# Patient Record
Sex: Female | Born: 1955 | Race: White | Hispanic: No | Marital: Married | Smoking: Never smoker
Health system: Southern US, Community
[De-identification: ages and names within clinical notes are randomized; demographics above are authoritative.]

## PROBLEM LIST (undated history)

## (undated) DIAGNOSIS — D259 Leiomyoma of uterus, unspecified: Secondary | ICD-10-CM

## (undated) DIAGNOSIS — K5792 Diverticulitis of intestine, part unspecified, without perforation or abscess without bleeding: Secondary | ICD-10-CM

## (undated) DIAGNOSIS — J45909 Unspecified asthma, uncomplicated: Secondary | ICD-10-CM

## (undated) DIAGNOSIS — U071 COVID-19: Secondary | ICD-10-CM

## (undated) DIAGNOSIS — N289 Disorder of kidney and ureter, unspecified: Secondary | ICD-10-CM

## (undated) DIAGNOSIS — K219 Gastro-esophageal reflux disease without esophagitis: Secondary | ICD-10-CM

## (undated) DIAGNOSIS — F419 Anxiety disorder, unspecified: Secondary | ICD-10-CM

## (undated) DIAGNOSIS — T7840XA Allergy, unspecified, initial encounter: Secondary | ICD-10-CM

## (undated) DIAGNOSIS — C44311 Basal cell carcinoma of skin of nose: Secondary | ICD-10-CM

## (undated) DIAGNOSIS — I499 Cardiac arrhythmia, unspecified: Secondary | ICD-10-CM

## (undated) DIAGNOSIS — E785 Hyperlipidemia, unspecified: Secondary | ICD-10-CM

## (undated) DIAGNOSIS — N83209 Unspecified ovarian cyst, unspecified side: Secondary | ICD-10-CM

## (undated) DIAGNOSIS — I493 Ventricular premature depolarization: Secondary | ICD-10-CM

## (undated) HISTORY — PX: ESOPHAGOGASTRODUODENOSCOPY: SHX1529

## (undated) HISTORY — DX: Gastro-esophageal reflux disease without esophagitis: K21.9

## (undated) HISTORY — PX: COLONOSCOPY: SHX174

## (undated) HISTORY — DX: Unspecified ovarian cyst, unspecified side: N83.209

## (undated) HISTORY — DX: Cardiac arrhythmia, unspecified: I49.9

## (undated) HISTORY — DX: Hyperlipidemia, unspecified: E78.5

## (undated) HISTORY — DX: COVID-19: U07.1

## (undated) HISTORY — DX: Anxiety disorder, unspecified: F41.9

## (undated) HISTORY — PX: DILATION AND CURETTAGE OF UTERUS: SHX78

## (undated) HISTORY — DX: Disorder of kidney and ureter, unspecified: N28.9

## (undated) HISTORY — DX: Unspecified asthma, uncomplicated: J45.909

## (undated) HISTORY — DX: Allergy, unspecified, initial encounter: T78.40XA

## (undated) HISTORY — DX: Leiomyoma of uterus, unspecified: D25.9

## (undated) HISTORY — DX: Diverticulitis of intestine, part unspecified, without perforation or abscess without bleeding: K57.92

---

## 1998-10-30 ENCOUNTER — Other Ambulatory Visit: Admission: RE | Admit: 1998-10-30 | Discharge: 1998-10-30 | Payer: Self-pay | Admitting: Obstetrics & Gynecology

## 2000-08-28 ENCOUNTER — Other Ambulatory Visit: Admission: RE | Admit: 2000-08-28 | Discharge: 2000-08-28 | Payer: Self-pay | Admitting: Obstetrics & Gynecology

## 2002-01-14 ENCOUNTER — Other Ambulatory Visit: Admission: RE | Admit: 2002-01-14 | Discharge: 2002-01-14 | Payer: Self-pay | Admitting: Obstetrics & Gynecology

## 2002-01-17 ENCOUNTER — Ambulatory Visit (HOSPITAL_COMMUNITY): Admission: RE | Admit: 2002-01-17 | Discharge: 2002-01-17 | Payer: Self-pay | Admitting: Obstetrics & Gynecology

## 2002-01-17 ENCOUNTER — Encounter: Payer: Self-pay | Admitting: Obstetrics & Gynecology

## 2003-04-26 ENCOUNTER — Other Ambulatory Visit: Admission: RE | Admit: 2003-04-26 | Discharge: 2003-04-26 | Payer: Self-pay | Admitting: Obstetrics & Gynecology

## 2004-06-21 ENCOUNTER — Other Ambulatory Visit: Admission: RE | Admit: 2004-06-21 | Discharge: 2004-06-21 | Payer: Self-pay | Admitting: Obstetrics & Gynecology

## 2004-09-22 DIAGNOSIS — D259 Leiomyoma of uterus, unspecified: Secondary | ICD-10-CM

## 2004-09-22 HISTORY — DX: Leiomyoma of uterus, unspecified: D25.9

## 2006-05-11 ENCOUNTER — Other Ambulatory Visit: Admission: RE | Admit: 2006-05-11 | Discharge: 2006-05-11 | Payer: Self-pay | Admitting: Obstetrics and Gynecology

## 2006-07-02 ENCOUNTER — Emergency Department (HOSPITAL_COMMUNITY): Admission: EM | Admit: 2006-07-02 | Discharge: 2006-07-02 | Payer: Self-pay | Admitting: Family Medicine

## 2007-08-06 ENCOUNTER — Other Ambulatory Visit: Admission: RE | Admit: 2007-08-06 | Discharge: 2007-08-06 | Payer: Self-pay | Admitting: Obstetrics and Gynecology

## 2008-04-03 ENCOUNTER — Emergency Department (HOSPITAL_COMMUNITY): Admission: EM | Admit: 2008-04-03 | Discharge: 2008-04-03 | Payer: Self-pay | Admitting: Family Medicine

## 2008-12-27 ENCOUNTER — Other Ambulatory Visit: Admission: RE | Admit: 2008-12-27 | Discharge: 2008-12-27 | Payer: Self-pay | Admitting: Obstetrics and Gynecology

## 2009-09-07 ENCOUNTER — Emergency Department (HOSPITAL_COMMUNITY): Admission: EM | Admit: 2009-09-07 | Discharge: 2009-09-07 | Payer: Self-pay | Admitting: Emergency Medicine

## 2010-04-04 ENCOUNTER — Encounter: Payer: Self-pay | Admitting: Internal Medicine

## 2010-05-21 ENCOUNTER — Encounter: Payer: Self-pay | Admitting: Internal Medicine

## 2010-07-02 ENCOUNTER — Encounter: Payer: Self-pay | Admitting: Internal Medicine

## 2010-09-06 ENCOUNTER — Encounter: Payer: Self-pay | Admitting: Internal Medicine

## 2010-09-11 ENCOUNTER — Encounter: Payer: Self-pay | Admitting: Internal Medicine

## 2010-10-03 ENCOUNTER — Encounter: Payer: Self-pay | Admitting: Internal Medicine

## 2010-10-04 ENCOUNTER — Ambulatory Visit
Admission: RE | Admit: 2010-10-04 | Discharge: 2010-10-04 | Payer: Self-pay | Source: Home / Self Care | Attending: Internal Medicine | Admitting: Internal Medicine

## 2010-10-04 DIAGNOSIS — R002 Palpitations: Secondary | ICD-10-CM | POA: Insufficient documentation

## 2010-10-04 DIAGNOSIS — R0989 Other specified symptoms and signs involving the circulatory and respiratory systems: Secondary | ICD-10-CM

## 2010-10-04 DIAGNOSIS — R0609 Other forms of dyspnea: Secondary | ICD-10-CM | POA: Insufficient documentation

## 2010-10-04 DIAGNOSIS — E785 Hyperlipidemia, unspecified: Secondary | ICD-10-CM | POA: Insufficient documentation

## 2010-10-04 DIAGNOSIS — R0602 Shortness of breath: Secondary | ICD-10-CM | POA: Insufficient documentation

## 2010-10-09 ENCOUNTER — Ambulatory Visit (HOSPITAL_COMMUNITY): Admission: RE | Admit: 2010-10-09 | Payer: Self-pay | Source: Home / Self Care | Admitting: Internal Medicine

## 2010-10-09 ENCOUNTER — Ambulatory Visit: Admit: 2010-10-09 | Payer: Self-pay

## 2010-10-17 ENCOUNTER — Ambulatory Visit: Admission: RE | Admit: 2010-10-17 | Discharge: 2010-10-17 | Payer: Self-pay | Source: Home / Self Care

## 2010-10-17 ENCOUNTER — Ambulatory Visit (HOSPITAL_COMMUNITY)
Admission: RE | Admit: 2010-10-17 | Discharge: 2010-10-17 | Payer: Self-pay | Source: Home / Self Care | Attending: Internal Medicine | Admitting: Internal Medicine

## 2010-10-24 NOTE — Letter (Signed)
Summary: [facility]   Imported By: Marylou Mccoy 10/03/2010 14:22:45  _____________________________________________________________________  External Attachment:    Type:   Image     Comment:   External Document

## 2010-10-24 NOTE — Assessment & Plan Note (Addendum)
Summary: palpitations/dyspena/mt   Visit Type:  Initial Consult Primary Provider:  Silas Sacramento  CC:  palpitations.  History of Present Illness: patient is a 55 year old who was refered for evaluation of palpitations. The patinet was followed by Stevphen Rochester in the past.  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  She has had them for years.  Aoubt 3 months ago they became more frequent.  Stay as isolated skips.  Acomp by some dizziness, occasional tightness. She also notes SOB with taking.  PFTs in the past have been OK.  Denies SOB with activity.  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 She does note increased stress with her job.  Current Medications (verified): 1)  Melatonin 3 Mg Tabs (Melatonin) .... Take 1 Tab By Mouth At Bedtime 2)  Alprazolam 0.25 Mg Tabs (Alprazolam) .... As Needed 3)  Vivelle-Dot 0.0375 Mg/24hr Pttw (Estradiol) .... Tweice A Week 4)  Prometrium 100 Mg Caps (Progesterone Micronized) .... Take 1 Capsule By Mouth Once A Day 5)  Symbicort 80-4.5 Mcg/act Aero (Budesonide-Formoterol Fumarate) .... As Needed 6)  Albuterol Sulfate (2.5 Mg/57ml) 0.083% Nebu (Albuterol Sulfate) .... As Needed  Allergies (verified): No Known Drug Allergies  Past History:  Past Medical History:  asthma, allergies  PVCs  Family History: Father:  History of CAD CABG and valve replacement at 16.  Died 37 Mother A and W ! sis with HTN, dyslipidemia.   1 bro A&W  Social History:  non-smoker,non-drinker, nodrug abuse  Nurse Married.  Review of Systems       All systems reviewed.  NEg to the above problem except as noted above. LDL was 149, HDL 60 on last lipid eval.  Vital Signs:  Patient profile:   55 year old female Height:      63 inches Weight:      128.75 pounds BMI:     22.89 Pulse rate:   84 / minute Pulse rhythm:   regular Resp:     18 per minute BP sitting:   126 / 80  (left arm) Cuff size:   large  Vitals Entered By: Vikki Ports  (October 04, 2010 3:53 PM)  Physical Exam  Additional Exam:  Patient is in NAD HEENT:  Normocephalic, atraumatic. EOMI, PERRLA.  Neck: JVP is normal. No thyromegaly. No bruits.  Lungs: clear to auscultation. No rales no wheezes.  Heart: Regular rate and rhythm. Normal S1, S2. No S3.   No significant murmurs. PMI not displaced.  Abdomen:  Supple, nontender. Normal bowel sounds. No masses. No hepatomegaly.  Extremities:   Good distal pulses throughout. No lower extremity edema.  Musculoskeletal :moving all extremities.  Neuro:   alert and oriented x3.    EKG  Procedure date:  09/11/2010  Findings:      Outside EKG:  NSR.  Impression & Recommendations:  Problem # 1:  PALPITATIONS (ICD-785.1) Palpitations sound like isolated PVCs.  Frequency has increased.  I would recomm a holtermonitor to evaluate burden.I would also recommend and Echo to eval LVEF.   TSH and K were normal.   Patient instructed to limit caffeine.  Problem # 2:  DYSPNEA (ICD-786.05) Dyspnea is not with exertion it is with talking .  She has none with exertion.  No wheezing on exam. Will review echo.  Problem # 3:  HYPERLIPIDEMIA-MIXED (ICD-272.4) COunselled on diet.  Should be followed.  Other Orders: Holter Monitor (Holter Monitor) Echocardiogram (Echo)  Patient Instructions: 1)  Your physician recommends that you schedule a follow-up appointment in: after tests are complete and reviewed by Dr. Tenny Craw 2)  Your physician recommends that you continue on your current medications as directed. Please refer to the Current Medication list given to you today. 3)  Your physician has requested that you have an echocardiogram.  Echocardiography is a painless test that uses sound waves to create images of your heart. It provides your doctor with information about the size and shape of your heart and how well your heart's chambers and valves are working.  This procedure takes approximately one hour. There are no  restrictions for this procedure. 4)  Your physician has recommended that you wear a 24 hour holter monitor.  Holter monitors are medical devices that record the heart's electrical activity. Doctors most often use these monitors to diagnose arrhythmias. Arrhythmias are problems with the speed or rhythm of the heartbeat. The monitor is a small, portable device. You can wear one while you do your normal daily activities. This is usually used to diagnose what is causing palpitations/syncope (passing out).

## 2010-11-08 ENCOUNTER — Telehealth: Payer: Self-pay | Admitting: Internal Medicine

## 2010-11-13 NOTE — Progress Notes (Signed)
Summary: CHECKING HOLTER MONITOR  Phone Note Call from Patient Call back at [facility] Phone [phone]   Caller: Patient Summary of Call: PT CALLLING TO CHECK ON HEART MONITOR Initial call taken by: Judie Grieve,  November 08, 2010 2:20 PM  Follow-up for Phone Call        LM for Windell Moulding to call me concerning monitor.  Layne Benton, RN, BSN  November 08, 2010 3:02 PM   Additional Follow-up for Phone Call Additional follow up Details #1::        Windell Moulding scheduled her for an event monitor on 2/24.  Layne Benton, RN, BSN  November 08, 2010 5:42 PM

## 2010-11-15 ENCOUNTER — Encounter (INDEPENDENT_AMBULATORY_CARE_PROVIDER_SITE_OTHER): Payer: 59

## 2010-11-15 DIAGNOSIS — R002 Palpitations: Secondary | ICD-10-CM

## 2010-11-28 ENCOUNTER — Telehealth: Payer: Self-pay | Admitting: Internal Medicine

## 2010-12-03 NOTE — Progress Notes (Signed)
Summary: test results  Phone Note Call from Patient Call back at Home Phone [phone]   Caller: Patient Summary of Call: Pt returning call regarding test results Initial call taken by: Judie Grieve,  November 28, 2010 2:20 PM  Follow-up for Phone Call        Called patient with results of echo and holter monitor.  Layne Benton, RN, BSN  November 28, 2010 5:15 PM

## 2012-06-09 ENCOUNTER — Ambulatory Visit (INDEPENDENT_AMBULATORY_CARE_PROVIDER_SITE_OTHER): Payer: 59 | Admitting: Family Medicine

## 2012-06-09 ENCOUNTER — Other Ambulatory Visit: Payer: Self-pay | Admitting: Physician Assistant

## 2012-06-09 VITALS — BP 102/72 | HR 84 | Temp 98.9°F | Resp 18 | Ht 63.0 in | Wt 123.0 lb

## 2012-06-09 DIAGNOSIS — J40 Bronchitis, not specified as acute or chronic: Secondary | ICD-10-CM

## 2012-06-09 DIAGNOSIS — J45909 Unspecified asthma, uncomplicated: Secondary | ICD-10-CM

## 2012-06-09 MED ORDER — CEFDINIR 300 MG PO CAPS: 300.0000 mg | ORAL_CAPSULE | Freq: Two times a day (BID) | ORAL | Status: DC

## 2012-06-09 MED ORDER — PREDNISONE 20 MG PO TABS: ORAL_TABLET | ORAL | Status: DC

## 2012-06-09 MED ORDER — FLUTICASONE PROPIONATE 50 MCG/ACT NA SUSP: 2.0000 | Freq: Every day | NASAL | Status: DC

## 2012-06-09 MED ORDER — BECLOMETHASONE DIPROPIONATE 80 MCG/ACT IN AERS: 1.0000 | INHALATION_SPRAY | RESPIRATORY_TRACT | Status: DC | PRN

## 2012-06-09 NOTE — Progress Notes (Signed)
[facility] [address] [place] [address] [phone]  Date:  06/09/2012   Name:  Amy Phillips   DOB:  Feb 18, 1956   MRN:  [id number]  PCP:  Shade Flood, MD    Chief Complaint: Cough and Asthma   History of Present Illness:  Amy Phillips is a 56 y.o. very pleasant female patient who presents with the following:  She is here with illness today.  She has been ill for almost a week.  She has noted sinus drainage, and then developed chest congestion.  She has a cough- it is productive of dark yellow sputum She also has sinus pressure and pain She did have fever at the start of her illness.   No GI symptoms.  She does have a ST and earache.    She has not been wheezing but has felt more tight.  She has been using her albuterol every few hours.  It does help some when she uses it.   When she is not sick her asthma is usually quiet.  It flares up with illness and she usually has to use prednisone to resolve her symptoms.    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   Patient Active Problem List  Diagnosis  . HYPERLIPIDEMIA-MIXED  . PALPITATIONS  . DYSPNEA  . DYSPNEA ON EXERTION    No past medical history on file.  No past surgical history on file.  History  Substance Use Topics  . Smoking status: Never Smoker   . Smokeless tobacco: Not on file  . Alcohol Use: Not on file    No family history on file.  No Known Allergies  Medication list has been reviewed and updated.  Current Outpatient Prescriptions on File Prior to Visit  Medication Sig Dispense Refill  . albuterol (PROVENTIL) (2.5 MG/3ML) 0.083% nebulizer solution Take 2.5 mg by nebulization every 6 (six) hours as needed.      Marland Kitchen estradiol (VIVELLE-DOT) 0.025 MG/24HR Place 1 patch onto the skin 2 (two) times a week.        Review of Systems:  As per HPI- otherwise negative.   Physical Examination: Filed Vitals:   06/09/12 1208  BP: 102/72  Pulse: 84  Temp: 98.9 F (37.2 C)    Resp: 18   Filed Vitals:   06/09/12 1208  Height: 5\' 3"  (1.6 m)  Weight: 123 lb (55.792 kg)   Body mass index is 21.79 kg/(m^2). Ideal Body Weight: Weight in (lb) to have BMI = 25: 140.8   GEN: WDWN, NAD, Non-toxic, A & O x 3 HEENT: Atraumatic, Normocephalic. Neck supple. No masses, No LAD.  Bilateral TM wnl, oropharynx normal.  PEERL,EOMI.  Ears and Nose: No external deformity. CV: RRR, No M/G/R. No JVD. No thrill. No extra heart sounds. PULM: CTA B, no wheezes, crackles. No retractions. No resp. distress. No accessory muscle use.  Minimal ronchi bilaterally ABD: S, NT, ND, +BS. No rebound. No HSM. EXTR: No c/c/e NEURO Normal gait.  PSYCH: Normally interactive. Conversant. Not depressed or anxious appearing.  Calm demeanor.    Assessment and Plan: 1. Bronchitis  cefdinir (OMNICEF) 300 MG capsule  2. RAD (reactive airway disease)  beclomethasone (QVAR) 80 MCG/ACT inhaler, predniSONE (DELTASONE) 20 MG tablet, fluticasone (FLONASE) 50 MCG/ACT nasal spray   Suspect that Moselle has an exacerbation of her RAD due to bronchitis/ sinusitis.  Will treat with omnicef, qvar, prednisone and flonse.  Let me know if not better in the  next few days- Sooner if worse.    Meds ordered this encounter  Medications  . estradiol (VIVELLE-DOT) 0.025 MG/24HR    Sig: Place 1 patch onto the skin 2 (two) times a week.  . progesterone (ENDOMETRIN) 100 MG vaginal insert    Sig: Place 100 mg vaginally 2 (two) times daily.  Marland Kitchen DISCONTD: albuterol (PROVENTIL) (2.5 MG/3ML) 0.083% nebulizer solution    Sig: Take 2.5 mg by nebulization every 6 (six) hours as needed.  . ALPRAZolam (XANAX) 0.25 MG tablet    Sig: Take 0.25 mg by mouth at bedtime as needed.  . cefdinir (OMNICEF) 300 MG capsule    Sig: Take 1 capsule (300 mg total) by mouth 2 (two) times daily.    Dispense:  20 capsule    Refill:  0  . beclomethasone (QVAR) 80 MCG/ACT inhaler    Sig: Inhale 1 puff into the lungs as needed.    Dispense:  1  Inhaler    Refill:  4  . predniSONE (DELTASONE) 20 MG tablet    Sig: Take 3 pills daily for 2 days, then 2 pills daily for 2 days, then 1 pill daily for 2 days    Dispense:  12 tablet    Refill:  0  . fluticasone (FLONASE) 50 MCG/ACT nasal spray    Sig: Place 2 sprays into the nose daily.    Dispense:  16 g    Refill:  6  . albuterol (PROVENTIL HFA;VENTOLIN HFA) 108 (90 BASE) MCG/ACT inhaler    Sig: Inhale 2 puffs into the lungs every 4 (four) hours as needed.    Abbe Amsterdam, MD

## 2012-07-23 HISTORY — PX: UPPER GI ENDOSCOPY: SHX6162

## 2012-07-26 LAB — HM COLONOSCOPY

## 2012-07-27 ENCOUNTER — Encounter: Payer: Self-pay | Admitting: Family Medicine

## 2012-07-27 DIAGNOSIS — K635 Polyp of colon: Secondary | ICD-10-CM | POA: Insufficient documentation

## 2012-07-27 DIAGNOSIS — K5790 Diverticulosis of intestine, part unspecified, without perforation or abscess without bleeding: Secondary | ICD-10-CM | POA: Insufficient documentation

## 2012-08-05 DIAGNOSIS — K5792 Diverticulitis of intestine, part unspecified, without perforation or abscess without bleeding: Secondary | ICD-10-CM

## 2012-08-05 HISTORY — DX: Diverticulitis of intestine, part unspecified, without perforation or abscess without bleeding: K57.92

## 2013-02-17 ENCOUNTER — Ambulatory Visit (INDEPENDENT_AMBULATORY_CARE_PROVIDER_SITE_OTHER): Payer: 59 | Admitting: Family Medicine

## 2013-02-17 VITALS — BP 151/87 | HR 80 | Temp 98.0°F | Resp 18 | Ht 63.0 in | Wt 127.0 lb

## 2013-02-17 DIAGNOSIS — S90129A Contusion of unspecified lesser toe(s) without damage to nail, initial encounter: Secondary | ICD-10-CM

## 2013-02-17 DIAGNOSIS — J45901 Unspecified asthma with (acute) exacerbation: Secondary | ICD-10-CM

## 2013-02-17 DIAGNOSIS — F419 Anxiety disorder, unspecified: Secondary | ICD-10-CM

## 2013-02-17 DIAGNOSIS — M79609 Pain in unspecified limb: Secondary | ICD-10-CM

## 2013-02-17 MED ORDER — PREDNISONE 50 MG PO TABS: ORAL_TABLET | ORAL | Status: DC

## 2013-02-17 MED ORDER — AZITHROMYCIN 250 MG PO TABS: ORAL_TABLET | ORAL | Status: DC

## 2013-02-17 MED ORDER — ALPRAZOLAM 0.25 MG PO TABS: 0.2500 mg | ORAL_TABLET | Freq: Every evening | ORAL | Status: DC | PRN

## 2013-02-17 NOTE — Progress Notes (Signed)
Subjective:    Amy Phillips is a 57 y.o. female who presents to [facility] today with complaints of asthma exacerbation:  1.  Asthma exacerbation:  Patient works as a Actor at [employer].  Has been exposed to multiple URIs recently.  Also frequently has asthma exacerbations this time of year due to bad seasonal allergies.  REcent exacerbation started about 5 days ago and has been progressing.  Describes using her albuterol every 4 hours when usually she doesn't need it at all during the day.  Has dry hacking cough which is worse at night and awakens her from sleep.  No fevers or chills.  Taking her QVar daily.    Non-smoker.     The following portions of the patient's history were reviewed and updated as appropriate: allergies, current medications, past medical history, family and social history, and problem list. Patient is a nonsmoker.    PMH reviewed.  Past Medical History  Diagnosis Date  . Allergy   . Asthma   . Anxiety   . Arrhythmia   . Uterine fibroid    No past surgical history on file.  Medications reviewed. Current Outpatient Prescriptions  Medication Sig Dispense Refill  . ALPRAZolam (XANAX) 0.25 MG tablet Take 0.25 mg by mouth at bedtime as needed.      . beclomethasone (QVAR) 80 MCG/ACT inhaler Inhale 1 puff into the lungs as needed.  1 Inhaler  4  . estradiol (VIVELLE-DOT) 0.025 MG/24HR Place 1 patch onto the skin 2 (two) times a week.      . fluticasone (FLONASE) 50 MCG/ACT nasal spray Place 2 sprays into the nose daily.  16 g  6  . progesterone (ENDOMETRIN) 100 MG vaginal insert Place 100 mg vaginally 2 (two) times daily.      . VENTOLIN HFA 108 (90 BASE) MCG/ACT inhaler INHALE 2 PUFFS EVERY 4-6 HOURS AS NEEDED  1 Inhaler  11   No current facility-administered medications for this visit.    ROS as above otherwise neg.  No chest pain, palpitations, SOB, Fever, Chills, Abd pain, N/V/D.   Objective:   Physical Exam BP 151/87  Pulse 80  Temp(Src) 98 F (36.7  C) (Oral)  Resp 18  Ht 5\' 3"  (1.6 m)  Wt 127 lb (57.607 kg)  BMI 22.5 kg/m2  SpO2 98% Gen:  Alert, cooperative patient who appears stated age in no acute distress.  Vital signs reviewed.Comfortable, no accessory muscle usage.  Speaking in complete sentences.   HEENT: EOMI,  MMM Neck:  Supple.  Trachea midline Cardiac:  Regular rate and rhythm without murmur auscultated.   Pulm:  Good movement throughout.  No wheezes/rhonchi noted.  Exts: No edema noted.    No results found for this or any previous visit (from the past 72 hour(s)).   1.  Asthma exacerbation: - Mild - Prednisone x 5 days.  - She has had success with Z-pack in past.   - No need for nebulizer here.  No need for inhaler refills.  FU PRN.  Red flags provided  2.  Anxiety: - Tells me that her PCP here could manage anxiety rather than gynecologist. - Can send in refills for her tonight for her Xanxax. FU with PCP

## 2013-02-17 NOTE — Patient Instructions (Signed)
I have sent in the Prednisone and the Z-pak for you.    Also refilled the Xanax.  It was good to see you today -- feel better soon!

## 2013-02-18 ENCOUNTER — Telehealth: Payer: Self-pay

## 2013-02-18 ENCOUNTER — Other Ambulatory Visit: Payer: Self-pay | Admitting: Radiology

## 2013-02-18 MED ORDER — PREDNISONE 50 MG PO TABS: ORAL_TABLET | ORAL | Status: DC

## 2013-02-18 NOTE — Telephone Encounter (Signed)
Patient was seen last night. States that [facility] had a question about the dosage on Prednisone. Patient states she would like this filled as soon as possible because she is having trouble breathing. [facility] [phone]

## 2013-02-18 NOTE — Telephone Encounter (Signed)
Resent the prednisone for her.

## 2013-02-18 NOTE — Telephone Encounter (Signed)
Called pharmacy and patient has already gotten this.

## 2013-03-29 ENCOUNTER — Encounter: Payer: Self-pay | Admitting: Certified Nurse Midwife

## 2013-04-04 ENCOUNTER — Encounter: Payer: Self-pay | Admitting: Certified Nurse Midwife

## 2013-04-04 ENCOUNTER — Ambulatory Visit (INDEPENDENT_AMBULATORY_CARE_PROVIDER_SITE_OTHER): Payer: 59 | Admitting: Certified Nurse Midwife

## 2013-04-04 VITALS — BP 104/60 | HR 80 | Resp 16 | Ht 62.5 in | Wt 125.0 lb

## 2013-04-04 DIAGNOSIS — Z01419 Encounter for gynecological examination (general) (routine) without abnormal findings: Secondary | ICD-10-CM

## 2013-04-04 DIAGNOSIS — D259 Leiomyoma of uterus, unspecified: Secondary | ICD-10-CM

## 2013-04-04 DIAGNOSIS — Z0000 Encounter for general adult medical examination without abnormal findings: Secondary | ICD-10-CM

## 2013-04-04 DIAGNOSIS — D219 Benign neoplasm of connective and other soft tissue, unspecified: Secondary | ICD-10-CM

## 2013-04-04 DIAGNOSIS — N951 Menopausal and female climacteric states: Secondary | ICD-10-CM

## 2013-04-04 MED ORDER — ESTRADIOL 0.0375 MG/24HR TD PTTW: 1.0000 | MEDICATED_PATCH | TRANSDERMAL | Status: DC

## 2013-04-04 MED ORDER — PROGESTERONE MICRONIZED 100 MG PO CAPS: 100.0000 mg | ORAL_CAPSULE | Freq: Every day | ORAL | Status: DC

## 2013-04-04 NOTE — Progress Notes (Signed)
57 y.o. [id number] Married Caucasian Fe here for annual exam. Menopausal on HRT. Desires continuation, due to helping with hot flashes and feeling "myself". Noted spotting in underwear today, but misses putting on Minivelle as directed occasionally. fSaw PCP for asthma recently. Saw Dr. Loreta Ave for colonoscopy and had labs there, all normal. Complaining of back pain after prolonged standing. ? fibroids  No other health issues. LMP 6/09 Sees Urgent care for panic attacks if needs medication.  Patient's last menstrual period was 02/21/2012.   spotting       Sexually active: yes  The current method of family planning is none.    Exercising: yes  walking Smoker:  no  Health Maintenance: Pap:  04-02-12 neg HPV HR neg MMG:  11-20-09 normal Colonoscopy:  2013 neg. BMD:   none TDaP:  2008 Labs: none Self breast exam: done monthly   reports that she has never smoked. She does not have any smokeless tobacco history on file. She reports that she does not drink alcohol or use illicit drugs.  Past Medical History  Diagnosis Date  . Allergy   . Asthma   . Anxiety   . Arrhythmia   . GERD (gastroesophageal reflux disease)   . Uterine fibroid 2006    16 week size    History reviewed. No pertinent past surgical history.  Current Outpatient Prescriptions  Medication Sig Dispense Refill  . ALPRAZolam (XANAX) 0.25 MG tablet Take 1 tablet (0.25 mg total) by mouth at bedtime as needed.  30 tablet  1  . beclomethasone (QVAR) 80 MCG/ACT inhaler Inhale 1 puff into the lungs as needed.  1 Inhaler  4  . estradiol (VIVELLE-DOT) 0.0375 MG/24HR Place 1 patch onto the skin 2 (two) times a week.      . fluticasone (FLONASE) 50 MCG/ACT nasal spray Place 2 sprays into the nose as needed.      . Probiotic Product (PROBIOTIC PO) Take by mouth daily.      . progesterone (PROMETRIUM) 100 MG capsule Take 100 mg by mouth daily.      . VENTOLIN HFA 108 (90 BASE) MCG/ACT inhaler INHALE 2 PUFFS EVERY 4-6 HOURS AS NEEDED  1  Inhaler  11   No current facility-administered medications for this visit.    Family History  Problem Relation Age of Onset  . Arthritis Mother   . Osteoporosis Mother   . Breast cancer Mother   . Cancer Mother     lung  . COPD Mother   . Cancer Father     lung  . COPD Father   . Heart disease Father   . Hypertension Sister   . Heart disease Sister   . Arthritis Sister   . Hypertension Brother     ROS:  Pertinent items are noted in HPI.  Otherwise, a comprehensive ROS was negative.  Exam:   BP 104/60  Pulse 80  Resp 16  Ht 5' 2.5" (1.588 m)  Wt 125 lb (56.7 kg)  BMI 22.48 kg/m2  LMP 02/21/2012 Height: 5' 2.5" (158.8 cm)  Ht Readings from Last 3 Encounters:  04/04/13 5' 2.5" (1.588 m)  02/17/13 5\' 3"  (1.6 m)  06/09/12 5\' 3"  (1.6 m)    General appearance: alert, cooperative and appears stated age Head: Normocephalic, without obvious abnormality, atraumatic Neck: no adenopathy, supple, symmetrical, trachea midline and thyroid normal to inspection and palpation Lungs: clear to auscultation bilaterally Breasts: normal appearance, no masses or tenderness, No nipple retraction or dimpling, No nipple discharge or bleeding,  No axillary or supraclavicular adenopathy Heart: regular rate and rhythm Abdomen: soft, non-tender; no masses,  no organomegaly Extremities: extremities normal, atraumatic, no cyanosis or edema Skin: Skin color, texture, turgor normal. No rashes or lesions Lymph nodes: Cervical, supraclavicular, and axillary nodes normal. No abnormal inguinal nodes palpated Neurologic: Grossly normal   Pelvic: External genitalia:  no lesions              Urethra:  normal appearing urethra with no masses, tenderness or lesions              Bartholin's and Skene's: normal                 Vagina: normal appearing vagina with normal color and discharge, no lesions              Cervix: normal, non tender              Pap taken: no Bimanual Exam:  Uterus:  enlarged,  18-20 weeks size and firm              Adnexa: difficult to palpate due to uterine size               Rectovaginal: Confirms               Anus:  normal sphincter tone, no lesions  A:  Well Woman with normal exam  Menopausal on HRT with spotting  History of fibroids with ? Size change, last PUS 2010  Family hx of breast cancer mother 65's  P:   Reviewed health and wellness pertinent to exam  Discussed concern with spotting even though inconsistent use of HRT, feel need evaluation, patient agreeable  Discussed size change and inability to adequately assess adnexa, PUS needed. Patient agreeable.  Stressed SBE  Pap smear as per guidelines   Mammogram yearly  Pap not taken today. Discussed importance of exercise and good diet and Vitamin D sources. return annually or prn  An After Visit Summary was printed and given to the patient.  Reviewed, TL

## 2013-04-05 NOTE — Patient Instructions (Signed)

## 2013-04-11 ENCOUNTER — Telehealth: Payer: Self-pay | Admitting: Certified Nurse Midwife

## 2013-04-11 NOTE — Telephone Encounter (Signed)
LMTCB to discuss insurance benefits and schedule PUS.  °

## 2013-04-12 NOTE — Progress Notes (Signed)
Patient left and did not have drawn

## 2013-04-12 NOTE — Progress Notes (Signed)
Patient left and did not have drawn 

## 2013-04-27 NOTE — Telephone Encounter (Signed)
LMTCB to discuss ins benefits and schedule PUS.  °

## 2013-04-29 ENCOUNTER — Encounter: Payer: Self-pay | Admitting: Radiology

## 2013-05-06 ENCOUNTER — Telehealth: Payer: Self-pay | Admitting: Certified Nurse Midwife

## 2013-05-06 NOTE — Telephone Encounter (Signed)
LMTCB to discuss ins benefits and schedule a pus.

## 2013-05-12 ENCOUNTER — Encounter: Payer: Self-pay | Admitting: Certified Nurse Midwife

## 2013-05-13 ENCOUNTER — Other Ambulatory Visit: Payer: Self-pay | Admitting: Physician Assistant

## 2013-05-17 ENCOUNTER — Other Ambulatory Visit: Payer: Self-pay | Admitting: Gynecology

## 2013-05-24 ENCOUNTER — Ambulatory Visit (INDEPENDENT_AMBULATORY_CARE_PROVIDER_SITE_OTHER): Payer: 59

## 2013-05-24 ENCOUNTER — Other Ambulatory Visit: Payer: Self-pay | Admitting: *Deleted

## 2013-05-24 ENCOUNTER — Other Ambulatory Visit: Payer: Self-pay | Admitting: Gynecology

## 2013-05-24 ENCOUNTER — Ambulatory Visit (INDEPENDENT_AMBULATORY_CARE_PROVIDER_SITE_OTHER): Payer: 59 | Admitting: Gynecology

## 2013-05-24 ENCOUNTER — Encounter: Payer: Self-pay | Admitting: Gynecology

## 2013-05-24 DIAGNOSIS — N852 Hypertrophy of uterus: Secondary | ICD-10-CM

## 2013-05-24 DIAGNOSIS — D259 Leiomyoma of uterus, unspecified: Secondary | ICD-10-CM

## 2013-05-24 DIAGNOSIS — N9489 Other specified conditions associated with female genital organs and menstrual cycle: Secondary | ICD-10-CM

## 2013-05-24 DIAGNOSIS — D219 Benign neoplasm of connective and other soft tissue, unspecified: Secondary | ICD-10-CM | POA: Insufficient documentation

## 2013-05-24 DIAGNOSIS — N950 Postmenopausal bleeding: Secondary | ICD-10-CM | POA: Insufficient documentation

## 2013-05-24 NOTE — Progress Notes (Signed)
     Pt here for u/s due to enlarged uterus on PE and bleeding that pt reports was associated with delay in changing vivelle patch.  U/S images compared with images from 2009 in our office. Multiple fibroids noted today -2 in fundal area 6 and 7cm, total of 10 noted.  Overall increase in uterine size note from 10x11 to 15x15cm.  Increase in most fibroids noted.  2 subserosal defects noted, no feeder vessel on either seen. Recommend SHG with EMB.  Risks of bleeding, infection and perforation reviewed, consent obtained. Speculum placed, cervix visualized, cleansed with betadine, insemination catheter placed and walls gently distended, 3 distinct masses seen protruding onto cavity. Endometrial pipelle then advanced into cavity, sounded greater than 10cm, multiple passes obtained for dark brown fluid until adequate sample obtained for pathology.  Pt tolerated well, will contact with results. Expressed concerns regarding marked increase in size of fibroids in postmenopausal state.  Pt understands and if biopsy is benign, would prefer short interval u/s  Pt did remark that she is not interested in hysterectomy unless other options exhausted

## 2013-05-27 ENCOUNTER — Telehealth: Payer: Self-pay | Admitting: Gynecology

## 2013-05-27 ENCOUNTER — Other Ambulatory Visit: Payer: Self-pay | Admitting: Gynecology

## 2013-05-27 DIAGNOSIS — D219 Benign neoplasm of connective and other soft tissue, unspecified: Secondary | ICD-10-CM

## 2013-05-27 LAB — IPS CERVICAL/ECC/EMB/VULVAR/VAGINAL BIOPSY

## 2013-05-27 NOTE — Telephone Encounter (Signed)
LMTCB to schedule a 6 month repeat scan to evaluate fibroids.

## 2013-05-30 ENCOUNTER — Telehealth: Payer: Self-pay | Admitting: *Deleted

## 2013-05-30 NOTE — Telephone Encounter (Signed)
Message copied by Lorraine Lax on Mon May 30, 2013  9:42 AM ------      Message from: Douglass Rivers      Created: Fri May 27, 2013  4:21 PM       Follow up u/s for 62m ordered, please inform pt ------

## 2013-05-30 NOTE — Telephone Encounter (Signed)
Patient notified see labs 

## 2013-05-30 NOTE — Telephone Encounter (Signed)
Left Message To Call Back  

## 2013-07-22 ENCOUNTER — Other Ambulatory Visit: Payer: Self-pay | Admitting: Family Medicine

## 2013-07-28 ENCOUNTER — Other Ambulatory Visit: Payer: Self-pay

## 2013-08-22 ENCOUNTER — Other Ambulatory Visit: Payer: Self-pay | Admitting: Family Medicine

## 2013-08-22 NOTE — Telephone Encounter (Signed)
This was last filled by Dr. Gwendolyn Grant,. But I am still listed as PCP. i will refill this once, but will need to follow up with me to discuss prior to next refill as I have not seen her in awhile.

## 2013-08-26 ENCOUNTER — Telehealth: Payer: Self-pay | Admitting: Gynecology

## 2013-08-26 ENCOUNTER — Ambulatory Visit (INDEPENDENT_AMBULATORY_CARE_PROVIDER_SITE_OTHER): Payer: 59 | Admitting: Gynecology

## 2013-08-26 ENCOUNTER — Telehealth: Payer: Self-pay | Admitting: *Deleted

## 2013-08-26 VITALS — BP 94/62 | HR 80 | Resp 12 | Ht 62.5 in | Wt 127.0 lb

## 2013-08-26 DIAGNOSIS — D219 Benign neoplasm of connective and other soft tissue, unspecified: Secondary | ICD-10-CM

## 2013-08-26 DIAGNOSIS — N950 Postmenopausal bleeding: Secondary | ICD-10-CM

## 2013-08-26 DIAGNOSIS — D259 Leiomyoma of uterus, unspecified: Secondary | ICD-10-CM

## 2013-08-26 MED ORDER — MISOPROSTOL 200 MCG PO TABS: ORAL_TABLET | ORAL | Status: DC

## 2013-08-26 NOTE — Telephone Encounter (Signed)
Per Dr Farrel Gobble, hysteroscopy/TruClear scheduled for 09-08-13 at 0845 at [facility]. Call to patient and left message with date on VM and need to call back on Monday and let us know if this date will work.   Will need to review surgery instructions and schedule 2 week post op.

## 2013-08-26 NOTE — Progress Notes (Signed)
Subjective:     Patient ID: Amy Phillips, female   DOB: 1956/06/05, 57 y.o.   MRN: [id number]  HPI Comments: Pt here reporting post-menopausal bleeding, she was evaluated in 9/14 with u/s, shg and emb.  Pt was noted to have 2 endometrial defects, fibroids?  And known multifibroid uterus.  Pt has stated in ast that she is not interested in hysterectomy, only as last resource.  Pt is using minivelle and prometrium daily.  She has been under a lot of stress this year with illnesses in mother and lost both dogs.  Pt denies missing any progestin pills.  She reports being at a conference and noticed painless red blood on her underwear and she placed a pad and has continued to spot red. Pt stopped her prometrium and the bleeding has lightened. Her biopsy in 9/14 was non-phasic endometrial glands. Her SHG was notable for 2 submucosal fibroids that were 1.7 and 1.1 cm.  She had 10 fibroids overall the largest are 7 and 6cm.  She was scheduled to have a short interval follow up u/s    Review of Systems  Constitutional: Negative for fever, chills, fatigue and unexpected weight change.  Genitourinary: Positive for vaginal bleeding. Negative for vaginal discharge, vaginal pain and pelvic pain.       Objective:   Physical Exam  Constitutional: She appears well-developed and well-nourished.  HENT:  Head: Normocephalic and atraumatic.  Neck: Normal range of motion.  Cardiovascular: Normal rate, regular rhythm and normal heart sounds.   Pulmonary/Chest: Effort normal and breath sounds normal. No respiratory distress. She has no wheezes. She has no rales.  Abdominal: Soft. She exhibits mass (fibroid noted u+1).  Genitourinary: Vagina normal. There is no rash or tenderness on the right labia. There is no rash or tenderness on the left labia. Uterus is enlarged (14w size, ). Cervix exhibits discharge (min blood). Cervix exhibits no motion tenderness.  Adnexa not palpable  Lymphadenopathy:       Right: No  inguinal adenopathy present.       Left: No inguinal adenopathy present.       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 She is a nurse Recommend proceeding with hysteroscopy to remove submucosal fibroids and have better sampling of caivty and she is agreeable. Risks and benefits of procedure were outlined.  Risk of bleeding, infection, uterine perforation were reviewed.  Risks from distending media, DVT and PE were discussed. Will plan a tru-clear, postoperative expectations were discussed. Questions addressed. We will schedule cytotec will be given pre-op 47m spent discussing pmb and enlarging fibroids

## 2013-08-26 NOTE — Telephone Encounter (Signed)
Patient is having PMB. Patient also has some questions regarding HRT.

## 2013-08-26 NOTE — Telephone Encounter (Signed)
Spoke with patient. Appointment with Dr. Farrel Gobble made for today.

## 2013-08-29 NOTE — Telephone Encounter (Signed)
Patient states she was told to ask for Amy Phillips about surgery dates since [name] is out today. Please return patient's call.

## 2013-09-01 ENCOUNTER — Telehealth: Payer: Self-pay | Admitting: *Deleted

## 2013-09-01 NOTE — Telephone Encounter (Signed)
Patient returned call and surgery date and instructions reviewed.  Instruction form will be mailed to patient.  Routing to provider for final review. Patient agreeable to disposition. Will close encounter

## 2013-09-01 NOTE — Telephone Encounter (Signed)
See previous phone message.  Surgery instructions reviewed and patient reported she thought Dr Farrel Gobble wanted her to use medication for cervix. [place] out pt pharm. Please advise.

## 2013-09-01 NOTE — Telephone Encounter (Signed)
Call to patietn to review surgery instructions, LMTCB on home and cell number.

## 2013-09-02 ENCOUNTER — Encounter (HOSPITAL_COMMUNITY): Payer: Self-pay | Admitting: Pharmacist

## 2013-09-02 NOTE — Telephone Encounter (Signed)
Reviewed with Dr Farrel Gobble verbally.  She confirms directions of evening before and morning of procedure.  Call to patient to notify.  LMTCB.

## 2013-09-02 NOTE — Telephone Encounter (Signed)
Patient retrurned call and notified of instructions.

## 2013-09-02 NOTE — Telephone Encounter (Signed)
Since her surgery is at 0845, do you want her to use AM of surgery?

## 2013-09-02 NOTE — Telephone Encounter (Signed)
i sent in cytotec on 12/5

## 2013-09-05 ENCOUNTER — Encounter (HOSPITAL_COMMUNITY): Payer: Self-pay

## 2013-09-05 ENCOUNTER — Encounter (HOSPITAL_COMMUNITY)
Admission: RE | Admit: 2013-09-05 | Discharge: 2013-09-05 | Disposition: A | Discharge status: Home / Self Care | Payer: 59 | Source: Ambulatory Visit | Attending: Gynecology | Admitting: Gynecology

## 2013-09-05 LAB — CBC
HCT: 37.4 % (ref 36.0–46.0)
Hemoglobin: 13.2 g/dL (ref 12.0–15.0)
MCH: 29.7 pg (ref 26.0–34.0)
MCHC: 35.3 g/dL (ref 30.0–36.0)
MCV: 84 fL (ref 78.0–100.0)
Platelets: 231 10*3/uL (ref 150–400)
RBC: 4.45 MIL/uL (ref 3.87–5.11)
RDW: 12.9 % (ref 11.5–15.5)
WBC: 6.1 10*3/uL (ref 4.0–10.5)

## 2013-09-05 NOTE — Pre-Procedure Instructions (Signed)
Pt history and last EKG reviewed by Dr Jean Rosenthal. No orders given.

## 2013-09-05 NOTE — Patient Instructions (Signed)
20 BRIER FIREBAUGH  09/05/2013   Your procedure is scheduled on:  09/08/13  Enter through the Main Entrance of [facility] at 715 AM.  Pick up the phone at the desk and dial [phone].   Call this number if you have problems the morning of surgery: [phone]   Remember:   Do not eat food:After Midnight.  Do not drink clear liquids: After Midnight.  Take these medicines the morning of surgery with A SIP OF WATER: Zantac, bring inhalers   Do not wear jewelry, make-up or nail polish.  Do not wear lotions, powders, or perfumes. You may wear deodorant.  Do not shave 48 hours prior to surgery.  Do not bring valuables to the hospital.  [facility] is not   responsible for any belongings or valuables brought to the hospital.  Contacts, dentures or bridgework may not be worn into surgery.  Leave suitcase in the car. After surgery it may be brought to your room.  For patients admitted to the hospital, checkout time is 11:00 AM the day of              discharge.   Patients discharged the day of surgery will not be allowed to drive             home.  Name and phone number of your driver: husband   Ray  Special Instructions:   Shower using CHG 2 nights before surgery and the night before surgery.  If you shower the day of surgery use CHG.  Use special wash - you have one bottle of CHG for all showers.  You should use approximately 1/3 of the bottle for each shower.   Please read over the following fact sheets that you were given:   Surgical Site Infection Prevention

## 2013-09-08 ENCOUNTER — Encounter (HOSPITAL_COMMUNITY): Payer: 59 | Admitting: Anesthesiology

## 2013-09-08 ENCOUNTER — Ambulatory Visit (HOSPITAL_COMMUNITY): Payer: 59 | Admitting: Anesthesiology

## 2013-09-08 ENCOUNTER — Encounter (HOSPITAL_COMMUNITY)
Admission: RE | Disposition: A | Discharge status: Home / Self Care | Payer: Self-pay | Source: Ambulatory Visit | Attending: Gynecology

## 2013-09-08 ENCOUNTER — Ambulatory Visit (HOSPITAL_COMMUNITY)
Admission: RE | Admit: 2013-09-08 | Discharge: 2013-09-08 | Disposition: A | Discharge status: Home / Self Care | Payer: 59 | Source: Ambulatory Visit | Attending: Gynecology | Admitting: Gynecology

## 2013-09-08 ENCOUNTER — Encounter (HOSPITAL_COMMUNITY): Payer: Self-pay | Admitting: Anesthesiology

## 2013-09-08 DIAGNOSIS — N950 Postmenopausal bleeding: Secondary | ICD-10-CM

## 2013-09-08 DIAGNOSIS — Z9889 Other specified postprocedural states: Secondary | ICD-10-CM

## 2013-09-08 DIAGNOSIS — Z79890 Hormone replacement therapy (postmenopausal): Secondary | ICD-10-CM | POA: Insufficient documentation

## 2013-09-08 DIAGNOSIS — D259 Leiomyoma of uterus, unspecified: Secondary | ICD-10-CM

## 2013-09-08 DIAGNOSIS — D250 Submucous leiomyoma of uterus: Secondary | ICD-10-CM | POA: Insufficient documentation

## 2013-09-08 HISTORY — PX: DILATATION & CURETTAGE/HYSTEROSCOPY WITH TRUECLEAR: SHX6353

## 2013-09-08 SURGERY — DILATATION & CURETTAGE/HYSTEROSCOPY WITH TRUCLEAR
Anesthesia: General | Site: Vagina

## 2013-09-08 MED ORDER — MEPERIDINE HCL 25 MG/ML IJ SOLN: 6.2500 mg | INTRAMUSCULAR | Status: DC | PRN

## 2013-09-08 MED ORDER — KETOROLAC TROMETHAMINE 30 MG/ML IJ SOLN
INTRAMUSCULAR | Status: AC
  Filled 2013-09-08: qty 1

## 2013-09-08 MED ORDER — DEXAMETHASONE SODIUM PHOSPHATE 10 MG/ML IJ SOLN
INTRAMUSCULAR | Status: AC
  Filled 2013-09-08: qty 1

## 2013-09-08 MED ORDER — LIDOCAINE HCL (CARDIAC) 20 MG/ML IV SOLN
INTRAVENOUS | Status: DC | PRN
  Administered 2013-09-08: 80 mg via INTRAVENOUS

## 2013-09-08 MED ORDER — ONDANSETRON HCL 4 MG/2ML IJ SOLN
INTRAMUSCULAR | Status: DC | PRN
  Administered 2013-09-08: 4 mg via INTRAVENOUS

## 2013-09-08 MED ORDER — PROPOFOL 10 MG/ML IV BOLUS
INTRAVENOUS | Status: DC | PRN
  Administered 2013-09-08: 170 mg via INTRAVENOUS

## 2013-09-08 MED ORDER — MIDAZOLAM HCL 2 MG/2ML IJ SOLN
INTRAMUSCULAR | Status: AC
  Filled 2013-09-08: qty 2

## 2013-09-08 MED ORDER — LIDOCAINE HCL 2 % IJ SOLN
INTRAMUSCULAR | Status: DC | PRN
  Administered 2013-09-08: 20 mL

## 2013-09-08 MED ORDER — ONDANSETRON HCL 4 MG/2ML IJ SOLN
INTRAMUSCULAR | Status: AC
  Filled 2013-09-08: qty 2

## 2013-09-08 MED ORDER — BUPIVACAINE-EPINEPHRINE (PF) 0.5% -1:200000 IJ SOLN
INTRAMUSCULAR | Status: AC
  Filled 2013-09-08: qty 10

## 2013-09-08 MED ORDER — FENTANYL CITRATE 0.05 MG/ML IJ SOLN
INTRAMUSCULAR | Status: DC | PRN
  Administered 2013-09-08: 100 ug via INTRAVENOUS

## 2013-09-08 MED ORDER — METOCLOPRAMIDE HCL 5 MG/ML IJ SOLN: 10.0000 mg | Freq: Once | INTRAMUSCULAR | Status: DC | PRN

## 2013-09-08 MED ORDER — FENTANYL CITRATE 0.05 MG/ML IJ SOLN
INTRAMUSCULAR | Status: AC
  Administered 2013-09-08: 50 ug via INTRAVENOUS
  Filled 2013-09-08: qty 2

## 2013-09-08 MED ORDER — LIDOCAINE HCL (CARDIAC) 20 MG/ML IV SOLN
INTRAVENOUS | Status: AC
  Filled 2013-09-08: qty 5

## 2013-09-08 MED ORDER — PROPOFOL 10 MG/ML IV EMUL
INTRAVENOUS | Status: AC
  Filled 2013-09-08: qty 20

## 2013-09-08 MED ORDER — DEXAMETHASONE SODIUM PHOSPHATE 10 MG/ML IJ SOLN
INTRAMUSCULAR | Status: DC | PRN
  Administered 2013-09-08: 10 mg via INTRAVENOUS

## 2013-09-08 MED ORDER — SODIUM CHLORIDE 0.9 % IR SOLN
Status: DC | PRN
  Administered 2013-09-08 (×3): 3000 mL

## 2013-09-08 MED ORDER — BUPIVACAINE-EPINEPHRINE PF 0.25-1:200000 % IJ SOLN
INTRAMUSCULAR | Status: AC
  Filled 2013-09-08: qty 30

## 2013-09-08 MED ORDER — FENTANYL CITRATE 0.05 MG/ML IJ SOLN
INTRAMUSCULAR | Status: AC
  Filled 2013-09-08: qty 2

## 2013-09-08 MED ORDER — GLYCOPYRROLATE 0.2 MG/ML IJ SOLN
INTRAMUSCULAR | Status: DC | PRN
  Administered 2013-09-08: 0.1 mg via INTRAVENOUS

## 2013-09-08 MED ORDER — BUPIVACAINE-EPINEPHRINE 0.25% -1:200000 IJ SOLN
INTRAMUSCULAR | Status: DC | PRN
  Administered 2013-09-08: 30 mL

## 2013-09-08 MED ORDER — GLYCOPYRROLATE 0.2 MG/ML IJ SOLN
INTRAMUSCULAR | Status: AC
  Filled 2013-09-08: qty 1

## 2013-09-08 MED ORDER — EPHEDRINE SULFATE 50 MG/ML IJ SOLN
INTRAMUSCULAR | Status: DC | PRN
  Administered 2013-09-08: 10 mg via INTRAVENOUS

## 2013-09-08 MED ORDER — LIDOCAINE HCL 2 % IJ SOLN
INTRAMUSCULAR | Status: AC
  Filled 2013-09-08: qty 20

## 2013-09-08 MED ORDER — FENTANYL CITRATE 0.05 MG/ML IJ SOLN
25.0000 ug | INTRAMUSCULAR | Status: DC | PRN
  Administered 2013-09-08 (×2): 50 ug via INTRAVENOUS

## 2013-09-08 MED ORDER — EPHEDRINE 5 MG/ML INJ
INTRAVENOUS | Status: AC
  Filled 2013-09-08: qty 10

## 2013-09-08 MED ORDER — KETOROLAC TROMETHAMINE 30 MG/ML IJ SOLN
INTRAMUSCULAR | Status: DC | PRN
  Administered 2013-09-08: 30 mg via INTRAVENOUS

## 2013-09-08 MED ORDER — MIDAZOLAM HCL 2 MG/2ML IJ SOLN
INTRAMUSCULAR | Status: DC | PRN
  Administered 2013-09-08: 2 mg via INTRAVENOUS

## 2013-09-08 MED ORDER — LACTATED RINGERS IV SOLN
INTRAVENOUS | Status: DC
  Administered 2013-09-08 (×3): via INTRAVENOUS

## 2013-09-08 SURGICAL SUPPLY — 20 items
BLADE INCISOR TRUC PLUS 2.9 (ABLATOR) IMPLANT
CANISTERS HI-FLOW 3000CC (CANNISTER) ×4 IMPLANT
CATH ROBINSON RED A/P 16FR (CATHETERS) ×2 IMPLANT
CONTAINER PREFILL 10% NBF 60ML (FORM) ×4 IMPLANT
DRAPE HYSTEROSCOPY (DRAPE) IMPLANT
DRAPE STERI URO 9X17 APER PCH (DRAPES) ×2 IMPLANT
DRSG TELFA 3X8 NADH (GAUZE/BANDAGES/DRESSINGS) ×2 IMPLANT
GLOVE BIOGEL M 6.5 STRL (GLOVE) ×2 IMPLANT
GLOVE BIOGEL PI IND STRL 6.5 (GLOVE) ×1 IMPLANT
GLOVE BIOGEL PI INDICATOR 6.5 (GLOVE) ×1
GOWN STRL REIN XL XLG (GOWN DISPOSABLE) ×4 IMPLANT
INCISOR TRUC PLUS BLADE 2.9 (ABLATOR)
KIT HYSTEROSCOPY TRUCLEAR (ABLATOR) ×2 IMPLANT
MORCELLATOR RECIP TRUCLEAR 4.0 (ABLATOR) ×2 IMPLANT
NEEDLE SPNL 22GX3.5 QUINCKE BK (NEEDLE) ×2 IMPLANT
PACK VAGINAL MINOR WOMEN LF (CUSTOM PROCEDURE TRAY) ×2 IMPLANT
PAD OB MATERNITY 4.3X12.25 (PERSONAL CARE ITEMS) ×2 IMPLANT
SYRINGE 10CC LL (SYRINGE) ×2 IMPLANT
TOWEL OR 17X24 6PK STRL BLUE (TOWEL DISPOSABLE) ×4 IMPLANT
WATER STERILE IRR 1000ML POUR (IV SOLUTION) ×2 IMPLANT

## 2013-09-08 NOTE — Anesthesia Procedure Notes (Signed)
Procedure Name: LMA Insertion Date/Time: 09/08/2013 8:55 AM Performed by: Japleen Tornow, Jannet Askew Pre-anesthesia Checklist: Patient identified, Patient being monitored, Emergency Drugs available, Timeout performed and Suction available Patient Re-evaluated:Patient Re-evaluated prior to inductionOxygen Delivery Method: Circle system utilized Preoxygenation: Pre-oxygenation with 100% oxygen Intubation Type: IV induction LMA: LMA inserted LMA Size: 4.0 Number of attempts: 1 Dental Injury: Teeth and Oropharynx as per pre-operative assessment

## 2013-09-08 NOTE — Anesthesia Postprocedure Evaluation (Signed)
  Anesthesia Post-op Note  Patient: Amy Phillips  Procedure(s) Performed: Procedure(s): DILATATION & CURETTAGE/HYSTEROSCOPY WITH TRUCLEAR (N/A)  Patient Location: PACU  Anesthesia Type:General  Level of Consciousness: awake, alert  and oriented  Airway and Oxygen Therapy: Patient Spontanous Breathing  Post-op Pain: none  Post-op Assessment: Post-op Vital signs reviewed, Patient's Cardiovascular Status Stable, Respiratory Function Stable, Patent Airway, No signs of Nausea or vomiting and Pain level controlled  Post-op Vital Signs: Reviewed and stable  Complications: No apparent anesthesia complications

## 2013-09-08 NOTE — Anesthesia Preprocedure Evaluation (Signed)
Anesthesia Evaluation    Airway Mallampati: II  TM Distance: >3 FB Neck ROM: Full    Dental no notable dental hx. (+) Teeth Intact   Pulmonary shortness of breath and with exertion, asthma ,  breath sounds clear to auscultation  Pulmonary exam normal       Cardiovascular negative cardio ROS  Rhythm:Regular Rate:Normal     Neuro/Psych Anxiety negative neurological ROS     GI/Hepatic Neg liver ROS, GERD-  Medicated and Controlled,Diverticulosis   Endo/Other  Hyperlipidemia  Renal/GU negative Renal ROS  negative genitourinary   Musculoskeletal negative musculoskeletal ROS (+)   Abdominal   Peds  Hematology negative hematology ROS (+)   Anesthesia Other Findings   Reproductive/Obstetrics Submucosal Fibroid PMB HSV                             Anesthesia Physical  Anesthesia Plan  ASA: II  Anesthesia Plan: General   Post-op Pain Management:    Induction: Intravenous  Airway Management Planned: LMA  Additional Equipment:   Intra-op Plan:   Post-operative Plan: Extubation in OR  Informed Consent: I have reviewed the patients History and Physical, chart, labs and discussed the procedure including the risks, benefits and alternatives for the proposed anesthesia with the patient or authorized representative who has indicated his/her understanding and acceptance.   Dental advisory given  Plan Discussed with: Anesthesiologist, CRNA and Surgeon  Anesthesia Plan Comments:         Anesthesia Quick Evaluation  

## 2013-09-08 NOTE — H&P (View-Only) (Signed)
Subjective:     Patient ID: Amy Phillips, female   DOB: 05/08/1956, 57 y.o.   MRN: [id number]  HPI Comments: Pt here reporting post-menopausal bleeding, she was evaluated in 9/14 with u/s, shg and emb.  Pt was noted to have 2 endometrial defects, fibroids?  And known multifibroid uterus.  Pt has stated in ast that she is not interested in hysterectomy, only as last resource.  Pt is using minivelle and prometrium daily.  She has been under a lot of stress this year with illnesses in mother and lost both dogs.  Pt denies missing any progestin pills.  She reports being at a conference and noticed painless red blood on her underwear and she placed a pad and has continued to spot red. Pt stopped her prometrium and the bleeding has lightened. Her biopsy in 9/14 was non-phasic endometrial glands. Her SHG was notable for 2 submucosal fibroids that were 1.7 and 1.1 cm.  She had 10 fibroids overall the largest are 7 and 6cm.  She was scheduled to have a short interval follow up u/s    Review of Systems  Constitutional: Negative for fever, chills, fatigue and unexpected weight change.  Genitourinary: Positive for vaginal bleeding. Negative for vaginal discharge, vaginal pain and pelvic pain.       Objective:   Physical Exam  Constitutional: She appears well-developed and well-nourished.  HENT:  Head: Normocephalic and atraumatic.  Neck: Normal range of motion.  Cardiovascular: Normal rate, regular rhythm and normal heart sounds.   Pulmonary/Chest: Effort normal and breath sounds normal. No respiratory distress. She has no wheezes. She has no rales.  Abdominal: Soft. She exhibits mass (fibroid noted u+1).  Genitourinary: Vagina normal. There is no rash or tenderness on the right labia. There is no rash or tenderness on the left labia. Uterus is enlarged (14w size, ). Cervix exhibits discharge (min blood). Cervix exhibits no motion tenderness.  Adnexa not palpable  Lymphadenopathy:       Right: No  inguinal adenopathy present.       Left: No inguinal adenopathy present.       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 She is a nurse Recommend proceeding with hysteroscopy to remove submucosal fibroids and have better sampling of caivty and she is agreeable. Risks and benefits of procedure were outlined.  Risk of bleeding, infection, uterine perforation were reviewed.  Risks from distending media, DVT and PE were discussed. Will plan a tru-clear, postoperative expectations were discussed. Questions addressed. We will schedule cytotec will be given pre-op 30m spent discussing pmb and enlarging fibroids       

## 2013-09-08 NOTE — Interval H&P Note (Signed)
History and Physical Interval Note:  09/08/2013 8:36 AM  Amy Phillips  has presented today for surgery, with the diagnosis of endometrial fibroid PMB  The various methods of treatment have been discussed with the patient and family. After consideration of risks, benefits and other options for treatment, the patient has consented to  Procedure(s): DILATATION & CURETTAGE/HYSTEROSCOPY WITH TRUCLEAR (N/A) as a surgical intervention .  The patient's history has been reviewed, patient examined, no change in status, stable for surgery.  I have reviewed the patient's chart and labs.  Questions were answered to the patient's satisfaction.     [name] H

## 2013-09-08 NOTE — Brief Op Note (Signed)
09/08/2013  10:34 AM  PATIENT:  Amy Phillips  57 y.o. female  PRE-OPERATIVE DIAGNOSIS:  endometrial fibroid PMB  POST-OPERATIVE DIAGNOSIS:  endometrial fibroid,PMB  PROCEDURE:  Procedure(s): DILATATION & CURETTAGE/HYSTEROSCOPY WITH TRUCLEAR (N/A)  SURGEON:  Surgeon(s) and Role:    * Bennye Alm, MD - Primary  PHYSICIAN ASSISTANT:   ASSISTANTS: none   ANESTHESIA:   general  EBL:  Total I/O In: 1300 [I.V.:1300] Out: 50 [Urine:50]  BLOOD ADMINISTERED:none  DRAINS: none   LOCAL MEDICATIONS USED: 0.25% MARCAINE   , 2% LIDOCAINE  and Amount: 12 ml  SPECIMEN:  fibroid  DISPOSITION OF SPECIMEN:  PATHOLOGY  COUNTS:  YES  TOURNIQUET:  * No tourniquets in log *  DICTATION: .Other Dictation: Dictation Number [id number]  PLAN OF CARE: Discharge to home after PACU  PATIENT DISPOSITION:  PACU - hemodynamically stable.   Delay start of Pharmacological VTE agent (>24hrs) due to surgical blood loss or risk of bleeding: not applicable

## 2013-09-08 NOTE — Transfer of Care (Signed)
Immediate Anesthesia Transfer of Care Note  Patient: Amy Phillips  Procedure(s) Performed: Procedure(s): DILATATION & CURETTAGE/HYSTEROSCOPY WITH TRUCLEAR (N/A)  Patient Location: PACU  Anesthesia Type:General  Level of Consciousness: awake, alert  and oriented  Airway & Oxygen Therapy: Patient Spontanous Breathing and Patient connected to nasal cannula oxygen  Post-op Assessment: Report given to PACU RN and Post -op Vital signs reviewed and stable  Post vital signs: Reviewed and stable  Complications: No apparent anesthesia complications

## 2013-09-09 ENCOUNTER — Encounter (HOSPITAL_COMMUNITY): Payer: Self-pay | Admitting: Gynecology

## 2013-09-09 NOTE — Op Note (Signed)
NAMEKATHRINA, CROSLEY NO.:  [id number]  MEDICAL RECORD NO.:  [id number]  LOCATION:  [facility]                          FACILITY:  [facility]  PHYSICIAN:  Ivor Costa. Farrel Gobble, M.D. DATE OF BIRTH:  February 20, 1956  DATE OF PROCEDURE:  09/08/2013 DATE OF DISCHARGE:  09/08/2013                              OPERATIVE REPORT   PREOPERATIVE DIAGNOSES:  Postmenopausal bleeding and multi-fibroid uterus with submucosal fibroids.  POSTOPERATIVE DIAGNOSES:  Postmenopausal bleeding and multi-fibroid uterus with submucosal fibroids.  PROCEDURE:  Hysteroscopic myomectomy with TRUCLEAR, incomplete.  SURGEON:  Ivor Costa. Farrel Gobble, M.D.  ANESTHESIA:  General.  IV FLUIDS:  1300 mL lactated Ringer's.  URINE OUTPUT:  50 mL.  I and O deficit of normal saline solution was approximately 1500.  FINDINGS:  A multi-fibroid uterus.  There were what look like five submucosal fibroids, some of which were broad base consistent with intramural component.  The ostia were visualized.  Uterus had sounded to 10.  INDICATIONS:  The patient is a 57 year old postmenopausal woman with multi-fibroid uterus, who had recurrent episodes of postmenopausal bleeding while on hormone replacement.  The patient had biopsy earlier in the year that had nonphasic endometrial cells and had declined any intervention when she represented with 1 day of clotted blood.  She now elected to go to the OR to evaluate the cavity rule out anything that appeared to be a polyp and perhaps removal of what we thought were two submucosal fibroids.  PATHOLOGY:  Uterine portions of submucosal fibroids.  COMPLICATIONS:  None.  DESCRIPTION OF PROCEDURE:  The patient was taken to the operating room. General anesthesia was induced, placed in the dorsal lithotomy position. Prepped and draped in usual sterile fashion.  Bimanual exam was performed.  The uterus was just below the umbilicus.  A mobile bivalve speculum was placed in the vagina.  The  cervix was visualized, stabilized with a single-tooth tenaculum and the paracervical block was placed with equal parts 2% lidocaine and 0.25% Marcaine.  The uterine sound initially could not be passed.  The cervix was gently dilated up and once we had reached 17-French, the uterus sounded to 10.  It was cleared that there were some fibroids that were obstructing the dilators.  The uterus was then dilated up to 19-French and the diagnostic scope was advanced through the cervix and into the cavity. Once we were in the cavity, it was apparent that there were not too, but rather five fibroids distorting the anterior wall of the cavity.  The ostia were visualized.  The scope was removed and the cervix was then dilated up to 27-French.  The obturator was unable to be passed.  The cervix was further dilated eventually to 31-French, at which point, the obturator was able to be advanced through the cervix and into the cavity.  The hysteroscope with the fibroid blade was then advanced through the obturator and into the cavity.  The cavity was cleared of all clots and debris.  The hysteroscope was primed and we began dissecting one of the anterior fibroids that was felt to be firm and granular and calcified.  The fibroid appeared perhaps two have been delivering during  the process and was incompletely removed.  We attempted two other fibroids and similarly did not have very good resection despite multiple attempts of applying the blade to the fibroid.  Because it was cleared that we were going to be unsuccessful in completing the procedure and the patient was postmenopausal, and we had sampled three different fibroids, we elected at this point to stop the procedure based on the fluid deficit.  The patient was hemostatic throughout.  There was no bleeding noted from any of the fibroid spots. The resectoscope was removed slowly, the cervix was unremarkable.  There was small area of bleeding on the  cervix and a single-tooth tenaculum that was treated with a suture of 3-0 chromic on the anterior and the posterior lip.  The patient tolerated the procedure well.  Sponge, lap, and needle counts were correct x2.  She was extubated in the OR, and transferred to the PACU in stable condition.     Ivor Costa. Farrel Gobble, M.D.     THL/MEDQ  D:  09/08/2013  T:  09/09/2013  Job:  [id number]

## 2013-09-12 ENCOUNTER — Telehealth: Payer: Self-pay | Admitting: *Deleted

## 2013-09-12 NOTE — Telephone Encounter (Signed)
LMTCB on home and cell number

## 2013-09-12 NOTE — Telephone Encounter (Signed)
Message copied by Alisa Graff on Mon Sep 12, 2013  1:51 PM ------      Message from: Douglass Rivers      Created: Mon Sep 12, 2013  8:39 AM       Inform benign fibroid- 3 were sampled ------

## 2013-09-13 NOTE — Telephone Encounter (Signed)
Return call back to patient. Advised of path result as directed by dr lathrop and although may not be a full evaluation, at least what we have is benign. Patient states she is doing well at home, has been tired from anesthesia.  Still has light bleeding and cramping.  Cramping better today.  She has continued the estrogen and progesterone per the directions of PACU nurse until she sees you for her post op on 09-27-13.  Is this what you wanted her to do?  She thinks you mentioned changing her medications.

## 2013-09-13 NOTE — Telephone Encounter (Signed)
Pt returning call

## 2013-09-14 ENCOUNTER — Ambulatory Visit (INDEPENDENT_AMBULATORY_CARE_PROVIDER_SITE_OTHER): Payer: 59 | Admitting: Gynecology

## 2013-09-14 ENCOUNTER — Encounter: Payer: Self-pay | Admitting: Gynecology

## 2013-09-14 VITALS — BP 128/86 | HR 88 | Resp 18 | Ht 62.5 in | Wt 126.0 lb

## 2013-09-14 DIAGNOSIS — D219 Benign neoplasm of connective and other soft tissue, unspecified: Secondary | ICD-10-CM

## 2013-09-14 DIAGNOSIS — D259 Leiomyoma of uterus, unspecified: Secondary | ICD-10-CM

## 2013-09-14 DIAGNOSIS — S3763XA Laceration of uterus, initial encounter: Secondary | ICD-10-CM

## 2013-09-14 DIAGNOSIS — N950 Postmenopausal bleeding: Secondary | ICD-10-CM

## 2013-09-14 DIAGNOSIS — S3760XA Unspecified injury of uterus, initial encounter: Secondary | ICD-10-CM

## 2013-09-14 NOTE — Telephone Encounter (Signed)
Ov today.

## 2013-09-14 NOTE — Telephone Encounter (Signed)
Call back to patient to advise Dr Farrel Gobble confirmed to continue HRT until appointment. Patient states that her bleeding has restarted and was suddenly heavy last night.  States bleeding had mostly stopped but restarted with small dime size clots and a soaked thru a panty liner.  Had cramping and "fibroids were tight".  Seems to be a little better this am.  Patient has been sick with cold type symptoms and has mother in the hospital so has been very busy and not able to rest.  Wants to check with Dr lathrop about bleeding.  States she was concerned enough last night that she almost called MD on call.  Please advise.

## 2013-09-14 NOTE — Telephone Encounter (Signed)
yes

## 2013-09-14 NOTE — Telephone Encounter (Signed)
Call to patient, instructed to come on to office for work in appointment.

## 2013-09-14 NOTE — Patient Instructions (Addendum)
Rest No work today Cal for bleeding

## 2013-09-14 NOTE — Progress Notes (Signed)
Subjective:     Patient ID: Amy Phillips, female   DOB: 08/21/1956, 57 y.o.   MRN: [id number]  HPI Comments: Pt called to report increase in vaginal bleeding post-op s/p incomplete hysteroscopic resection of fibroid.  Pt had restarted HRT post-op although had indicated that she was going to stop, in addition she had been up and about with her mother who is currently in the hospital with URTI and she also has one.  Pt reports she had some camping and bleeding yesterday but it has started to get better since she rested at home  Vaginal Bleeding The patient's primary symptoms include pelvic pain (cramping improved). Pertinent negatives include no chills or fever.     Review of Systems  Constitutional: Negative for fever, chills and fatigue.  Genitourinary: Positive for vaginal bleeding and pelvic pain (cramping improved). Negative for vaginal pain.       Objective:   Physical Exam  Constitutional: She is oriented to person, place, and time. She appears well-developed and well-nourished.  Abdominal: Soft. There is no tenderness. There is no rebound and no guarding.  Genitourinary: Uterus is not tender.    Neurological: She is alert and oriented to person, place, and time.       Assessment:     Cervical laceration bleeding URIT     Plan:     recommmend pt rest over next few days, area treated with monsels To call with increase in bleeding, no blood noted from os Out of work Ok to stop estrogen, aware may have hot flashes Keep post-op visit  Operative findings reveiwed and path

## 2013-09-27 ENCOUNTER — Ambulatory Visit (INDEPENDENT_AMBULATORY_CARE_PROVIDER_SITE_OTHER): Payer: 59 | Admitting: Gynecology

## 2013-09-27 ENCOUNTER — Telehealth: Payer: Self-pay | Admitting: Gynecology

## 2013-09-27 VITALS — BP 122/78 | HR 64 | Resp 14 | Ht 62.5 in | Wt 125.0 lb

## 2013-09-27 DIAGNOSIS — Z9889 Other specified postprocedural states: Secondary | ICD-10-CM

## 2013-09-27 DIAGNOSIS — D219 Benign neoplasm of connective and other soft tissue, unspecified: Secondary | ICD-10-CM

## 2013-09-27 DIAGNOSIS — D259 Leiomyoma of uterus, unspecified: Secondary | ICD-10-CM

## 2013-09-27 NOTE — Patient Instructions (Signed)
Use nexium nightly  Avoid spicy foods, fried foods, alcohol  Call office if still bleeding after 10d-14d

## 2013-09-27 NOTE — Telephone Encounter (Signed)
Pt states she was suppose to have an ultrasound appointment set up already. Please call patient to schedule appointment please.

## 2013-09-28 NOTE — Telephone Encounter (Signed)
Amy Phillips, there is an order in with an expected date of 11-2013. Can you see if you can schedule this appointment for this patient.

## 2013-09-28 NOTE — Progress Notes (Signed)
Subjective:     Patient ID: Amy Phillips, female   DOB: 12-06-1955, 58 y.o.   MRN: [id number]  HPI Comments: Pt presents 3w post op s/p incomplete hysteroscopic myomectomy with sampling of fibroids due to rapid growth and PMB.  Pt has stopped her HRT and bleeding and slowed but not stopped.  She is having heavy coughing as result of URTI and asthma and issues with GERD at night that seems to make the bleeding worse.  She is using her inhaler.  She is not using her nexium.    Review of Systems  Constitutional: Negative for fever and chills.  Genitourinary: Positive for vaginal bleeding. Negative for vaginal discharge, vaginal pain and pelvic pain.       Objective:   Physical Exam  Constitutional: She appears well-developed and well-nourished.  Abdominal: Soft. There is no tenderness. There is no rebound and no guarding.  Genitourinary: Vagina normal. There is no rash or lesion on the right labia. There is no rash or lesion on the left labia. Uterus is enlarged (irregular, nontender). Uterus is not tender. Cervix exhibits friability. Right adnexum displays no mass. Left adnexum displays no mass.    Lymphadenopathy:       Right: No inguinal adenopathy present.       Left: No inguinal adenopathy present.       Assessment:     Post op hysteroscopy Bleeding Asthma GERD     Plan:     Areas of granulation tissue treated with AgNO3 Encourage pt to resume nexium to control cough related to regurge, avoid foods that will exacerbate gas and acid Continue asthma medications Pt to call if bleeding continues after 10d despite above meaasures, pt agreeable, reports that she bled 2w after in office EMB in past

## 2013-09-28 NOTE — Telephone Encounter (Signed)
Patient has been scheduled. Amy Phillips

## 2013-10-11 NOTE — Telephone Encounter (Signed)
I think she needs another appointment.

## 2013-10-11 NOTE — Telephone Encounter (Signed)
Spoke with pt who is continuing to have spotting on and off every day since last visit. Pt treated with silver nitrate at last visit which stopped bleeding for about 24 hours. Pt reports amount is a half dollar size in pad about every 2 hrs. It seems to increase with walking and decreases at night while she sleeps. No pain or odor, pt notices a cramp only occasionally "up in my uterus like where the fibroid is." No clots now. No fever. Pt says she is fine with waiting it out a few more days, she just wanted to let Dr. Charlies Constable know what was happening. Pt does not feel overly tired and is taking a Woman's One a Day MVI with iron. Please advise.

## 2013-10-11 NOTE — Telephone Encounter (Signed)
Patient is continuing to bleed from procedure. Has seen dr lathrop about it a few times already told her to call if it continued

## 2013-10-12 NOTE — Telephone Encounter (Signed)
LMTCB

## 2013-10-12 NOTE — Telephone Encounter (Signed)
Spoke with pt about coming in for appt 10-14-13 at 2:30 per TL. Pt agreeable.

## 2013-10-14 ENCOUNTER — Ambulatory Visit (INDEPENDENT_AMBULATORY_CARE_PROVIDER_SITE_OTHER): Payer: 59 | Admitting: Gynecology

## 2013-10-14 ENCOUNTER — Telehealth: Payer: Self-pay

## 2013-10-14 VITALS — BP 104/77 | HR 76 | Resp 16 | Ht 62.25 in | Wt 127.0 lb

## 2013-10-14 DIAGNOSIS — D259 Leiomyoma of uterus, unspecified: Secondary | ICD-10-CM

## 2013-10-14 DIAGNOSIS — N926 Irregular menstruation, unspecified: Secondary | ICD-10-CM

## 2013-10-14 DIAGNOSIS — D219 Benign neoplasm of connective and other soft tissue, unspecified: Secondary | ICD-10-CM

## 2013-10-14 DIAGNOSIS — N950 Postmenopausal bleeding: Secondary | ICD-10-CM

## 2013-10-14 LAB — HEMOGLOBIN, FINGERSTICK: Hemoglobin, fingerstick: 12.8 g/dL (ref 12.0–16.0)

## 2013-10-14 MED ORDER — DOXYCYCLINE HYCLATE 100 MG PO CAPS: 100.0000 mg | ORAL_CAPSULE | Freq: Two times a day (BID) | ORAL | Status: DC

## 2013-10-14 NOTE — Progress Notes (Signed)
Subjective:     Patient ID: Amy Phillips, female   DOB: June 04, 1956, 58 y.o.   MRN: [id number]  HPI Comments: Pt is here for continued post-operative bleeding that she reports as filling 1/4 maxi pad every 4h or so, no clots, occasional cramping. No fever or chills.  Pt stopped her HRT about 2w ago, she had been treated for cervical laceration with granulation tissue since surgery.  Pt reports that she bled similar to this for 3w after her EMB.      Review of Systems  Constitutional: Negative for fever, chills and fatigue.  Genitourinary: Positive for vaginal bleeding. Negative for dysuria, vaginal discharge, vaginal pain, pelvic pain and dyspareunia.       Objective:   Physical Exam  Constitutional: She is oriented to person, place, and time. She appears well-developed and well-nourished.  Neurological: She is alert and oriented to person, place, and time.  Skin: Skin is warm and dry.  Pelvic exam: VULVA: normal appearing vulva with no masses, tenderness or lesions, blood noted, VAGINA: moderate amount of dark blood, no malodor, CERVIX: normal appearing cervix without discharge or lesions, UTERUS: tenderness absent, enlarged to 14 week's size, irregular, mobile, ADNEXA: not palpable.      Assessment:     DUB, fibroid uterus     Plan:     Pt had been offered TAH in past and has refused and similarly refuses today.  We stressed that although her EMB and biopsies were benign there is concern that she continues to bleed.  No operative bleeding was noted during surgery but she had epinephine.  Her cervical lacerations have healed at this point.  She is adamet about not having a hysterectomy unless absolutely needed.  We explained that other options to treat her bleeding are inappropriate as she is post-menopausal.  We offered her a second opinion with gyn-onc and she agrees.  She is agreeable to treatment with doxycycline for possible endometritis. We will make the referral. We have asked her  to keep [place] apprised of her bleeding and she agrees.

## 2013-10-14 NOTE — Telephone Encounter (Signed)
Pt wanted to know if you still wanted her to keep & go to her u/s appt that is scheduled for march 3rd?

## 2013-10-14 NOTE — Telephone Encounter (Signed)
Pt notified to keep u/s appt per dr lathrop

## 2013-10-17 ENCOUNTER — Telehealth: Payer: Self-pay | Admitting: *Deleted

## 2013-10-17 MED ORDER — NORETHINDRONE ACETATE 5 MG PO TABS: 5.0000 mg | ORAL_TABLET | Freq: Three times a day (TID) | ORAL | Status: DC

## 2013-10-17 NOTE — Telephone Encounter (Signed)
Patient notified aware to take Aygestin TID and Dr. Charlies Constable will reassess bleeding Friday. Aygestin 5 mg #20/0refills sent to [place].   Appointment scheduled for 10/21/13 @ 7:30

## 2013-10-17 NOTE — Progress Notes (Signed)
Left Message To Call Back  

## 2013-10-17 NOTE — Telephone Encounter (Signed)
Left Message To Call Back  

## 2013-10-17 NOTE — Telephone Encounter (Signed)
Message copied by Alfonzo Feller on Mon Oct 17, 2013 11:13 AM ------      Message from: Elveria Rising      Created: Mon Oct 17, 2013 11:08 AM       Lets try aygestin TID and appt on Friday at 7:30am to reassess, #20 ------

## 2013-10-21 ENCOUNTER — Encounter: Payer: Self-pay | Admitting: Gynecology

## 2013-10-21 ENCOUNTER — Ambulatory Visit (INDEPENDENT_AMBULATORY_CARE_PROVIDER_SITE_OTHER): Payer: 59 | Admitting: Gynecology

## 2013-10-21 VITALS — BP 106/68 | HR 72 | Resp 16 | Ht 62.25 in | Wt 125.0 lb

## 2013-10-21 DIAGNOSIS — N950 Postmenopausal bleeding: Secondary | ICD-10-CM

## 2013-10-21 DIAGNOSIS — D219 Benign neoplasm of connective and other soft tissue, unspecified: Secondary | ICD-10-CM

## 2013-10-21 DIAGNOSIS — D259 Leiomyoma of uterus, unspecified: Secondary | ICD-10-CM

## 2013-10-21 MED ORDER — NORETHINDRONE ACETATE 5 MG PO TABS: 5.0000 mg | ORAL_TABLET | Freq: Two times a day (BID) | ORAL | Status: DC

## 2013-10-21 NOTE — Patient Instructions (Signed)
Will continue aygestin TID until bleeding stops then will decrease to BID for 10d then daily  kieep u/s in march

## 2013-10-21 NOTE — Progress Notes (Signed)
Subjective:     Patient ID: Amy Phillips, female   DOB: November 27, 1955, 58 y.o.   MRN: [id number]  HPI Comments: Pt asked to come in today for short interval evaluation of her bleeding.  Pt reports that after visit last week she had an increase in her bleeding where she was changing her pad more regularly.  She was started on aygestin TID for continued post-menopausal bleeding 3d ago.  Pt agreed to be assessed by gyn-onc and has not been contacted as of yet.  Pt has multiple fibroids and has had an increase in size but biopsy of 4 were benign. Pt reports that she is only having hot flashes.  She states that she is trikling only as of this am.    Review of Systems  Constitutional: Negative for fever, chills and fatigue.  per HPI     Objective:   Physical Exam  Nursing note and vitals reviewed. Constitutional: She is oriented to person, place, and time. She appears well-developed and well-nourished.  Neurological: She is alert and oriented to person, place, and time.  Skin: Skin is warm and dry.  Pelvic exam: VULVA: normal appearing vulva with no masses, tenderness or lesions, VAGINA: normal appearing vagina with normal color and discharge, no lesions, CERVIX: normal appearing cervix without discharge or lesions, UTERUS: enlarged to 16 week's size, irregular, ADNEXA: not paqlpable.      Assessment:     Post-menopausal bleeding fibroid uterus     Plan:     Will continue aygestin TID until bleeding stops then will decrease to BID for 10d then daily  kieep u/s in march

## 2013-10-25 ENCOUNTER — Telehealth: Payer: Self-pay | Admitting: Emergency Medicine

## 2013-10-25 NOTE — Telephone Encounter (Signed)
Scheduler calling to notify Dr. Charlies Constable that patient has appointment for Dr. Skeet Latch at [facility] for 2/19 at [place] (arrival time) Appointment at [place].   They wanted to check with Dr. Charlies Constable if feels she can wait until 2/19, otherwise patient will need to go to [facility].  Okay for 2/19 appointment?

## 2013-10-25 NOTE — Telephone Encounter (Signed)
Spoke with patient.  She would like to see Dr. Fermin Schwab only and declines to go to [facility] for earlier appointment.  Patient states bleeding is controlled. 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  Spoke with Lenna Sciara at [place] at [facility] and r/s for Dr. Aldean Ast, for 12/02/13 for a check in at 1030 and appointment at 11:00.  Message left to return call to [place] at [phone].

## 2013-10-25 NOTE — Telephone Encounter (Signed)
Message left to return call to Melford Tullier at [phone].    

## 2013-10-25 NOTE — Telephone Encounter (Signed)
Spoke with patient. She is agreeable to appointment and will call back with any further follow up needs.

## 2013-10-25 NOTE — Telephone Encounter (Signed)
Yes, as long as bleeding is controled

## 2013-11-10 ENCOUNTER — Ambulatory Visit: Payer: 59 | Admitting: Gynecologic Oncology

## 2013-11-21 ENCOUNTER — Other Ambulatory Visit: Payer: Self-pay | Admitting: Gynecology

## 2013-11-21 ENCOUNTER — Other Ambulatory Visit: Payer: Self-pay | Admitting: Family Medicine

## 2013-11-22 ENCOUNTER — Ambulatory Visit (INDEPENDENT_AMBULATORY_CARE_PROVIDER_SITE_OTHER): Payer: 59 | Admitting: Gynecology

## 2013-11-22 ENCOUNTER — Ambulatory Visit (INDEPENDENT_AMBULATORY_CARE_PROVIDER_SITE_OTHER): Payer: 59

## 2013-11-22 ENCOUNTER — Other Ambulatory Visit: Payer: Self-pay | Admitting: Gynecology

## 2013-11-22 ENCOUNTER — Encounter: Payer: Self-pay | Admitting: Gynecology

## 2013-11-22 VITALS — BP 130/82 | HR 68 | Resp 18 | Ht 62.25 in | Wt 129.0 lb

## 2013-11-22 DIAGNOSIS — N7011 Chronic salpingitis: Secondary | ICD-10-CM

## 2013-11-22 DIAGNOSIS — N7013 Chronic salpingitis and oophoritis: Secondary | ICD-10-CM

## 2013-11-22 DIAGNOSIS — D219 Benign neoplasm of connective and other soft tissue, unspecified: Secondary | ICD-10-CM

## 2013-11-22 DIAGNOSIS — N950 Postmenopausal bleeding: Secondary | ICD-10-CM

## 2013-11-22 DIAGNOSIS — D259 Leiomyoma of uterus, unspecified: Secondary | ICD-10-CM

## 2013-11-22 DIAGNOSIS — N924 Excessive bleeding in the premenopausal period: Secondary | ICD-10-CM

## 2013-11-22 MED ORDER — NORETHINDRONE ACETATE 5 MG PO TABS: 5.0000 mg | ORAL_TABLET | Freq: Two times a day (BID) | ORAL | Status: DC

## 2013-11-22 NOTE — Progress Notes (Signed)
      Pt here for f/u interval u/s for enlarging fibroids.  She has been stable on aygestin twice a day, with no bleeding but she does report some uterine tenderness which is new since starting the medication.  She has an appt with Dr Aldean Ast in 10d and will let us know his recommendations regarding her fibroids. She still is not interested in surgery unless medically necessary.  She denies any issues with bowels, bladder or pelvic pain and pressure.  Pt has not been sexually active since our last visit. We reviewed her last 2 u/s and compared with the u/s from 2010.  The fibroids appear stable over the last 60m.  Her hydrosalpinx and left adnexa are unchanged. We will have her continue her aygestin for now, refill given. Questions addressed. 32m spent reviewing u/s's and discussion of treatment of fibroids, >50% face to face

## 2013-12-02 ENCOUNTER — Encounter: Payer: Self-pay | Admitting: Gynecology

## 2013-12-02 ENCOUNTER — Ambulatory Visit: Payer: 59 | Attending: Gynecology | Admitting: Gynecology

## 2013-12-02 VITALS — BP 141/69 | HR 75 | Temp 98.5°F | Resp 20 | Ht 62.76 in | Wt 129.2 lb

## 2013-12-02 DIAGNOSIS — F411 Generalized anxiety disorder: Secondary | ICD-10-CM | POA: Insufficient documentation

## 2013-12-02 DIAGNOSIS — K219 Gastro-esophageal reflux disease without esophagitis: Secondary | ICD-10-CM | POA: Insufficient documentation

## 2013-12-02 DIAGNOSIS — J45909 Unspecified asthma, uncomplicated: Secondary | ICD-10-CM | POA: Insufficient documentation

## 2013-12-02 DIAGNOSIS — N950 Postmenopausal bleeding: Secondary | ICD-10-CM | POA: Insufficient documentation

## 2013-12-02 DIAGNOSIS — D259 Leiomyoma of uterus, unspecified: Secondary | ICD-10-CM | POA: Insufficient documentation

## 2013-12-02 DIAGNOSIS — N939 Abnormal uterine and vaginal bleeding, unspecified: Secondary | ICD-10-CM

## 2013-12-02 DIAGNOSIS — Z79899 Other long term (current) drug therapy: Secondary | ICD-10-CM | POA: Insufficient documentation

## 2013-12-02 NOTE — Patient Instructions (Signed)
Follow up with Dr. Charlies Constable.  Please call for any questions or concerns.

## 2013-12-02 NOTE — Progress Notes (Signed)
Consult Note: Gyn-Onc   Lucky Cowboy 58 y.o. female  Chief Complaint  Patient presents with  . Abnormal Uterine Bleeding    New Consult    Assessment : Postmenopausal bleeding now resolved with the use of Aygestin. Uterine fibroids (which were likely the etiology of her abnormal bleeding.)  Plan: The patient will continue Aygestin as prescribed by Dr. Charlies Constable and return to see Dr. Charlies Constable as scheduled. I would suggest continuing Aygestin for 2 months. If the bleeding has resolved I would then consider discontinuing Aygestin and see if the bleeding returns.    HPI: 58 year old white female seen in consultation request of Dr. Elveria Rising regarding postmenopausal bleeding.  The patient indicates she has a long-standing history of uterine fibroids first diagnosed by ultrasound during pregnancy. Following a term pregnancy and vaginal delivery she had 2 subsequent pregnancies that ended in miscarriages attributed to the presence of fibroids. Otherwise, the patient claims she did not have any significant symptoms from the fibroids including pain pressure or abnormal bleeding.  The patient underwent menopause. In the course of workup, an ultrasound was obtained showing endometrial abnormality. An endometrial biopsy was obtained. At this juncture the patient reports that this is when the bleeding started. Subsequently the patient's undergone hysteroscopy with partial resection of submucous fibroids which did not resolve the bleeding problem. More recently the patient has been placed on Aygestin. She indicates at that has resulted in resolution of her bleeding. At the present time the patient notes some pelvic pressure but no other symptoms associated with fibroids.  I reviewed ultrasound was from September 2014 and more recently in March of 2015 and it appears that the uterus has not changed in size during that period of time.  Review of Systems:10 point review of systems is negative except as  noted in interval history.   Vitals: Blood pressure 141/69, pulse 75, temperature 98.5 F (36.9 C), temperature source Oral, resp. rate 20, height 5' 2.76" (1.594 m), weight 129 lb 3.2 oz (58.605 kg), last menstrual period 09/08/2013.  Physical Exam: General : The patient is a healthy woman in no acute distress.  HEENT: normocephalic, extraoccular movements normal; neck is supple without thyromegally  Lynphnodes: Supraclavicular and inguinal nodes not enlarged  Abdomen: Soft, non-tender, no ascites, there is a mass extending from the pubis to 2 fingerbreadths below the umbilicus consistent with fibroids. These are nontender. Pelvic:  EGBUS: Normal female  Vagina: Normal, no lesions  Urethra and Bladder: Normal, non-tender  Cervix: Normal Uterus: Enlarged approximately 1 18 weeks' gestational size. The mass is mobile and nontender. There is no cul-de-sac nodularity. Bi-manual examination: Non-tender; no adenxal masses or nodularity  Rectal: normal sphincter tone, no masses, no blood  Lower extremities: No edema or varicosities. Normal range of motion      No Known Allergies  Past Medical History  Diagnosis Date  . Allergy   . Asthma   . Anxiety   . Arrhythmia   . GERD (gastroesophageal reflux disease)   . Uterine fibroid 2006    16 week size  . Diverticulitis 08/05/2012    Dr Collene Mares    Past Surgical History  Procedure Laterality Date  . Dilation and curettage of uterus      D&E x2 missed ab  . Dilatation & curettage/hysteroscopy with trueclear N/A 09/08/2013    Procedure: DILATATION & CURETTAGE/HYSTEROSCOPY WITH TRUCLEAR;  Surgeon: Azalia Bilis, MD;  Location: [facility];  Service: Gynecology;  Laterality: N/A;    Current Outpatient Prescriptions  Medication Sig Dispense Refill  . ALPRAZolam (XANAX) 0.25 MG tablet TAKE 1 TABLET BY MOUTH AT BEDTIME AS NEEDED  30 tablet  0  . Esomeprazole Magnesium (NEXIUM PO) Take 40 mg by mouth daily.       . fluticasone (FLONASE) 50  MCG/ACT nasal spray Place 2 sprays into the nose as needed for allergies.       Marland Kitchen norethindrone (AYGESTIN) 5 MG tablet Take 1 tablet (5 mg total) by mouth 2 (two) times daily.  60 tablet  0  . Probiotic Product (PROBIOTIC PO) Take 1 tablet by mouth 2 (two) times daily.       . valACYclovir (VALTREX) 1000 MG tablet TAKE 2 TABLETS BY MOUTH TWICE A DAY FOR 1 DAY, START AT 1ST SIGN OF ONSET  30 tablet  1  . beclomethasone (QVAR) 80 MCG/ACT inhaler Inhale 1 puff into the lungs as needed.  1 Inhaler  4  . Cetirizine HCl (ZYRTEC ALLERGY) 10 MG CAPS Take 1 capsule by mouth at bedtime.       . VENTOLIN HFA 108 (90 BASE) MCG/ACT inhaler INHALE 2 PUFFS EVERY 4-6 HOURS AS NEEDED  1 Inhaler  11   No current facility-administered medications for this visit.    History   Social History  . Marital Status: Married    Spouse Name: N/A    Number of Children: N/A  . Years of Education: N/A   Occupational History  . Not on file.   Social History Main Topics  . Smoking status: Never Smoker   . Smokeless tobacco: Not on file  . Alcohol Use: No  . Drug Use: No  . Sexual Activity: Yes    Partners: Male    Birth Control/ Protection: None   Other Topics Concern  . Not on file   Social History Narrative  . No narrative on file    Family History  Problem Relation Age of Onset  . Arthritis Mother   . Osteoporosis Mother   . Breast cancer Mother   . Cancer Mother     lung  . COPD Mother   . Cancer Father     lung  . COPD Father   . Heart disease Father   . Hypertension Sister   . Heart disease Sister   . Arthritis Sister   . Hypertension Brother       Alvino Chapel, MD 12/02/2013, 11:31 AM

## 2013-12-08 ENCOUNTER — Encounter: Payer: Self-pay | Admitting: Family Medicine

## 2013-12-19 ENCOUNTER — Telehealth: Payer: Self-pay | Admitting: Gynecology

## 2013-12-19 DIAGNOSIS — D219 Benign neoplasm of connective and other soft tissue, unspecified: Secondary | ICD-10-CM

## 2013-12-19 DIAGNOSIS — F411 Generalized anxiety disorder: Secondary | ICD-10-CM

## 2013-12-19 DIAGNOSIS — N950 Postmenopausal bleeding: Secondary | ICD-10-CM

## 2013-12-19 MED ORDER — NORETHINDRONE ACETATE 5 MG PO TABS: 5.0000 mg | ORAL_TABLET | Freq: Two times a day (BID) | ORAL | Status: DC

## 2013-12-19 MED ORDER — ALPRAZOLAM 0.25 MG PO TABS: ORAL_TABLET | ORAL | Status: DC

## 2013-12-19 NOTE — Telephone Encounter (Signed)
Saw note from CP, refill of both aygestin and xanax done. Should f/u after stopping aygestin re bleeding-so 25m

## 2013-12-19 NOTE — Telephone Encounter (Signed)
Pt would like nurse to call her so she can give an update on her appointment with referring dr.

## 2013-12-19 NOTE — Telephone Encounter (Signed)
Spoke with patient. Patient was referred to Dr. Marti Phillips for AUB and fibriods and was advised to call back with instructions after appointment. Calling to let Amy Phillips know that she was advised "It could be due to a lot of stress and slow healing from previous biopsies." Dr. Fermin Phillips instructs patient to "Try to relax." for a couple of months and to continue to take Amy Phillips as prescribed by Amy Phillips and to continue follow up with Amy Phillips. If any problems advised to call back. Notes from [facility] in EPIC and copied into this message for Amy Phillips (seen below in bold). Advised would speak with Amy Phillips and call back with further instructions and advice.  The patient will continue Amy Phillips as prescribed by Amy Phillips and return to see Amy Phillips as scheduled. I would suggest continuing Amy Phillips for 2 months. If the bleeding has resolved I would then consider discontinuing Amy Phillips and see if the bleeding returns.   Amy Phillips, patient states that she needs refill for Amy Phillips 5mg  which she is taking twice a day. Patient has 10 pills left. Okay to send in refills? Patient also wondering if you could place order for Amy Phillips 0.25mg  at bedtime as needed for anxiety. Patient states that when seeing Amy Phillips she tried Lexapro and Paxil neither of which worked for her and was having to come in q3 months for follow up with medication. Went to PCP and was prescribed 0.25mg  Amy Phillips which has been working for her. However, PCP wants her to come in for office visit in order to refill but patient wondering if you could refill since she has been in within the last month and has "been coming in frequently lately."

## 2013-12-19 NOTE — Telephone Encounter (Signed)
Left message to call Kaitlyn at [phone]. 

## 2013-12-21 ENCOUNTER — Telehealth: Payer: Self-pay | Admitting: Gynecology

## 2013-12-22 NOTE — Telephone Encounter (Signed)
Spoke with patient on 4/1. Advised of message from Dr.Lathrop from 3/30 telephone encounter (as seen below). Patient states that she will call back to schedule follow up appointment as she does not have her schedule with her.   Amy Bilis, MD at 12/19/2013 1:30 PM     Status: Signed        Saw note from CP, refill of both aygestin and xanax done.  Should f/u after stopping aygestin re bleeding-so 34m   Routing to provider for final review. Patient agreeable to disposition. Will close encounter

## 2014-01-26 ENCOUNTER — Other Ambulatory Visit: Payer: Self-pay | Admitting: Family Medicine

## 2014-01-26 ENCOUNTER — Other Ambulatory Visit: Payer: Self-pay | Admitting: Physician Assistant

## 2014-03-06 ENCOUNTER — Telehealth: Payer: Self-pay | Admitting: Gynecology

## 2014-03-06 ENCOUNTER — Ambulatory Visit: Payer: 59 | Admitting: Gynecology

## 2014-03-06 DIAGNOSIS — N950 Postmenopausal bleeding: Secondary | ICD-10-CM

## 2014-03-06 DIAGNOSIS — D252 Subserosal leiomyoma of uterus: Secondary | ICD-10-CM

## 2014-03-06 NOTE — Telephone Encounter (Signed)
Patient called to cancel her appointment today for recheck with Dr. Charlies Constable due to "came down with a nasty cold over the weekend." Patient will call back to reschedule. No penalty applied for missed appointment.

## 2014-03-10 ENCOUNTER — Other Ambulatory Visit: Payer: Self-pay | Admitting: Gynecology

## 2014-03-10 DIAGNOSIS — N950 Postmenopausal bleeding: Secondary | ICD-10-CM

## 2014-03-10 DIAGNOSIS — D252 Subserosal leiomyoma of uterus: Secondary | ICD-10-CM

## 2014-03-10 MED ORDER — NORETHINDRONE ACETATE 5 MG PO TABS: 5.0000 mg | ORAL_TABLET | Freq: Two times a day (BID) | ORAL | Status: DC

## 2014-03-10 NOTE — Telephone Encounter (Signed)
Pt wants to talk with the nurse about the 3 month trial medication she is on. She would like to extend this because she is in the process of moving and taking care of her sick mother.

## 2014-03-10 NOTE — Telephone Encounter (Signed)
Spoke with patient. Advised of message as seen below from Dr.Lathrop. Patient is agreeable and verbalizes understanding. "Dr.Clark-Pearson recommended that I get the refills from Dr.Lathrop. I know she may feel caught in the middle but I will make an appointment with him and keep Dr.Lathrop updated about what is going on. I just cant be bedridden right now with my mother's condition. Tell Dr.Lathrop thank you and I will make an appointment."   Routing to provider for final review. Patient agreeable to disposition. Will close encounter

## 2014-03-10 NOTE — Telephone Encounter (Signed)
Dr Aldean Ast had suggested she continue the aygestin for 31m and see if she restarted bleeding, 40m would have been mid May.  I think the hysterectomy is probably warrented but I think the question re continuing the rx is better for him to address.  I will refill only to give her time to get in touch with him.  Sorry that her mother is ill.

## 2014-03-10 NOTE — Telephone Encounter (Signed)
Spoke with patient. Patient has been on Aygestin 5mg  two times daily. Had appointment scheduled for 3 month follow up with Dr.Lathrop on 6/22 but had to cancel due to respiratory infection. Patient would like to know if she can extend her "3 month trial" out a little longer as she only has five pills left and her mother is very ill and she is currently in the middle of selling her house and moving. Patient states that she is still having spotting with aygestin but " I notice the spotting when I am really emotional." Patient would like to discuss hysterectomy and states that it would be done with Dr.Clark Pearson but is unable to have this until the fall due to mother's condition. "If I could just have a refill for a month or two and then come in to see Dr.Lathrop it would help out a lot." Advised would send a message over to Dr.Lathrop and give patient a call back with further recommendations. Patient agreeable. Would like medication if Dr.Lathrop approves sent to [facility].

## 2014-03-20 ENCOUNTER — Ambulatory Visit (INDEPENDENT_AMBULATORY_CARE_PROVIDER_SITE_OTHER): Payer: 59 | Admitting: Family Medicine

## 2014-03-20 VITALS — BP 148/80 | HR 88 | Temp 98.7°F | Resp 16 | Ht 63.5 in | Wt 128.8 lb

## 2014-03-20 DIAGNOSIS — J209 Acute bronchitis, unspecified: Secondary | ICD-10-CM

## 2014-03-20 DIAGNOSIS — J4521 Mild intermittent asthma with (acute) exacerbation: Secondary | ICD-10-CM

## 2014-03-20 DIAGNOSIS — J45901 Unspecified asthma with (acute) exacerbation: Secondary | ICD-10-CM

## 2014-03-20 MED ORDER — PREDNISONE 20 MG PO TABS: 40.0000 mg | ORAL_TABLET | Freq: Every day | ORAL | Status: DC

## 2014-03-20 MED ORDER — VENTOLIN HFA 108 (90 BASE) MCG/ACT IN AERS: INHALATION_SPRAY | RESPIRATORY_TRACT | Status: DC

## 2014-03-20 MED ORDER — QVAR 80 MCG/ACT IN AERS: INHALATION_SPRAY | RESPIRATORY_TRACT | Status: DC

## 2014-03-20 MED ORDER — FLUTICASONE PROPIONATE 50 MCG/ACT NA SUSP: 2.0000 | NASAL | Status: DC | PRN

## 2014-03-20 NOTE — Patient Instructions (Signed)
Acute Bronchitis Bronchitis is inflammation of the airways that extend from the windpipe into the lungs (bronchi). The inflammation often causes mucus to develop. This leads to a cough, which is the most common symptom of bronchitis.  In acute bronchitis, the condition usually develops suddenly and goes away over time, usually in a couple weeks. Smoking, allergies, and asthma can make bronchitis worse. Repeated episodes of bronchitis may cause further lung problems.  CAUSES Acute bronchitis is most often caused by the same virus that causes a cold. The virus can spread from person to person (contagious).  SIGNS AND SYMPTOMS   Cough.   Fever.   Coughing up mucus.   Body aches.   Chest congestion.   Chills.   Shortness of breath.   Sore throat.  DIAGNOSIS  Acute bronchitis is usually diagnosed through a physical exam. Tests, such as chest X-rays, are sometimes done to rule out other conditions.  TREATMENT  Acute bronchitis usually goes away in a couple weeks. Often times, no medical treatment is necessary. Medicines are sometimes given for relief of fever or cough. Antibiotics are usually not needed but may be prescribed in certain situations. In some cases, an inhaler may be recommended to help reduce shortness of breath and control the cough. A cool mist vaporizer may also be used to help thin bronchial secretions and make it easier to clear the chest.  HOME CARE INSTRUCTIONS  Get plenty of rest.   Drink enough fluids to keep your urine clear or pale yellow (unless you have a medical condition that requires fluid restriction). Increasing fluids may help thin your secretions and will prevent dehydration.   Only take over-the-counter or prescription medicines as directed by your health care provider.   Avoid smoking and secondhand smoke. Exposure to cigarette smoke or irritating chemicals will make bronchitis worse. If you are a smoker, consider using nicotine gum or skin  patches to help control withdrawal symptoms. Quitting smoking will help your lungs heal faster.   Reduce the chances of another bout of acute bronchitis by washing your hands frequently, avoiding people with cold symptoms, and trying not to touch your hands to your mouth, nose, or eyes.   Follow up with your health care provider as directed.  SEEK MEDICAL CARE IF: Your symptoms do not improve after 1 week of treatment.  SEEK IMMEDIATE MEDICAL CARE IF:  You develop an increased fever or chills.   You have chest pain.   You have severe shortness of breath.  You have bloody sputum.   You develop dehydration.  You develop fainting.  You develop repeated vomiting.  You develop a severe headache. MAKE SURE YOU:   Understand these instructions.  Will watch your condition.  Will get help right away if you are not doing well or get worse. Document Released: 10/16/2004 Document Revised: 05/11/2013 Document Reviewed: 03/01/2013 [facility] Patient Information 2015 [place], [place]. This information is not intended to replace advice given to you by your health care provider. Make sure you discuss any questions you have with your health care provider.  Asthma, Acute Bronchospasm Acute bronchospasm caused by asthma is also referred to as an asthma attack. Bronchospasm means your air passages become narrowed. The narrowing is caused by inflammation and tightening of the muscles in the air tubes (bronchi) in your lungs. This can make it hard to breathe or cause you to wheeze and cough. CAUSES Possible triggers are:  Animal dander from the skin, hair, or feathers of animals.  Dust mites  contained in house dust.  Cockroaches.  Pollen from trees or grass.  Mold.  Cigarette or tobacco smoke.  Air pollutants such as dust, household cleaners, hair sprays, aerosol sprays, paint fumes, strong chemicals, or strong odors.  Cold air or weather changes. Cold air may trigger inflammation.  Winds increase molds and pollens in the air.  Strong emotions such as crying or laughing hard.  Stress.  Certain medicines such as aspirin or beta-blockers.  Sulfites in foods and drinks, such as dried fruits and wine.  Infections or inflammatory conditions, such as a flu, cold, or inflammation of the nasal membranes (rhinitis).  Gastroesophageal reflux disease (GERD). GERD is a condition where stomach acid backs up into your esophagus.  Exercise or strenuous activity. SIGNS AND SYMPTOMS   Wheezing.  Excessive coughing, particularly at night.  Chest tightness.  Shortness of breath. DIAGNOSIS  Your health care provider will ask you about your medical history and perform a physical exam. A chest X-ray or blood testing may be performed to look for other causes of your symptoms or other conditions that may have triggered your asthma attack. TREATMENT  Treatment is aimed at reducing inflammation and opening up the airways in your lungs. Most asthma attacks are treated with inhaled medicines. These include quick relief or rescue medicines (such as bronchodilators) and controller medicines (such as inhaled corticosteroids). These medicines are sometimes given through an inhaler or a nebulizer. Systemic steroid medicine taken by mouth or given through an IV tube also can be used to reduce the inflammation when an attack is moderate or severe. Antibiotic medicines are only used if a bacterial infection is present.  HOME CARE INSTRUCTIONS   Rest.  Drink plenty of liquids. This helps the mucus to remain thin and be easily coughed up. Only use caffeine in moderation and do not use alcohol until you have recovered from your illness.  Do not smoke. Avoid being exposed to secondhand smoke.  You play a critical role in keeping yourself in good health. Avoid exposure to things that cause you to wheeze or to have breathing problems.  Keep your medicines up-to-date and available. Carefully  follow your health care provider's treatment plan.  Take your medicine exactly as prescribed.  When pollen or pollution is bad, keep windows closed and use an air conditioner or go to places with air conditioning.  Asthma requires careful medical care. See your health care provider for a follow-up as advised. If you are more than [redacted] weeks pregnant and you were prescribed any new medicines, let your obstetrician know about the visit and how you are doing. Follow up with your health care provider as directed.  After you have recovered from your asthma attack, make an appointment with your outpatient doctor to talk about ways to reduce the likelihood of future attacks. If you do not have a doctor who manages your asthma, make an appointment with a primary care doctor to discuss your asthma. SEEK IMMEDIATE MEDICAL CARE IF:   You are getting worse.  You have trouble breathing. If severe, call your local emergency services (911 in the [place]  You develop chest pain or discomfort.  You are vomiting.  You are not able to keep fluids down.  You are coughing up yellow, green, brown, or bloody sputum.  You have a fever and your symptoms suddenly get worse.  You have trouble swallowing. MAKE SURE YOU:   Understand these instructions.  Will watch your condition.  Will get help right away if  you are not doing well or get worse. Document Released: 12/24/2006 Document Revised: 09/13/2013 Document Reviewed: 03/16/2013 [facility] Patient Information 2015 [place], [place]. This information is not intended to replace advice given to you by your health care provider. Make sure you discuss any questions you have with your health care provider.

## 2014-03-20 NOTE — Progress Notes (Signed)
Subjective:    Patient ID: Amy Phillips, female    DOB: 11/27/1955, 58 y.o.   MRN: [id number] This chart was scribed for Delman Cheadle, MD by Vernell Barrier, Medical Scribe. The patient was seen in room 3. This patient's care was started at 5:47 PM.  Chief Complaint  Patient presents with  . Cough    hx of asthma   Cough Associated symptoms include postnasal drip and shortness of breath. Pertinent negatives include no chills or fever.   HPI Comments: Amy Phillips is a 58 y.o. female w/ hx of asthma and severe allergies presents to the [facility] for gradually worsening cough, SOB, and post nasal drip; onset 2 day ago. States she was taking care of 3 sick new born babies and believes she picked up something from them. Works as a Writer. Cough was productive at first; has since resolved and is now dry. Also reports some intermittent mid focal back pain. Unchanged with cough. States cough is more bronchospastic then wheezing. Currently on QVAR (2 puffs twice daily as needed for illness) and Ventolin (2-3 puffs every 2-3 hours) inhaler as well as Flonase nasal spray. Does not use Flonase. Denies fever or chills.   Patient Active Problem List   Diagnosis Date Noted  . Fibroids 05/24/2013  . Post-menopausal bleeding 05/24/2013  . Diverticulosis 07/27/2012  . Colon polyps 07/27/2012  . HYPERLIPIDEMIA-MIXED 10/04/2010  . PALPITATIONS 10/04/2010  . DYSPNEA 10/04/2010  . DYSPNEA ON EXERTION 10/04/2010   Past Medical History  Diagnosis Date  . Allergy   . Asthma   . Anxiety   . Arrhythmia   . GERD (gastroesophageal reflux disease)   . Uterine fibroid 2006    16 week size  . Diverticulitis 08/05/2012    Dr Collene Mares   Past Surgical History  Procedure Laterality Date  . Dilation and curettage of uterus      D&E x2 missed ab  . Dilatation & curettage/hysteroscopy with trueclear N/A 09/08/2013    Procedure: DILATATION & CURETTAGE/HYSTEROSCOPY WITH TRUCLEAR;   Surgeon: Azalia Bilis, MD;  Location: [facility];  Service: Gynecology;  Laterality: N/A;  . Upper gi endoscopy  07/23/12    grade 1 esophagitis noted:  otherwise normal esophagogastrduodenoscopy   No Known Allergies Prior to Admission medications   Medication Sig Start Date End Date Taking? Authorizing Provider  ALPRAZolam (XANAX) 0.25 MG tablet TAKE 1 TABLET BY MOUTH AT BEDTIME AS NEEDED 12/19/13  Yes Azalia Bilis, MD  fluticasone [facility]) 50 MCG/ACT nasal spray Place 2 sprays into the nose as needed for allergies.  06/09/12  Yes Gay Filler Copland, MD  norethindrone (AYGESTIN) 5 MG tablet Take 1 tablet (5 mg total) by mouth 2 (two) times daily. 03/10/14  Yes Azalia Bilis, MD  Probiotic Product (PROBIOTIC PO) Take 1 tablet by mouth 2 (two) times daily.    Yes Historical Provider, MD  QVAR 80 MCG/ACT inhaler Inhale 1 puff into the lungs as directed. PATIENT NEEDS OFFICE VISIT FOR ADDITIONAL REFILLS 01/26/14  Yes Eleanore Kurtis Bushman, PA-C  valACYclovir (VALTREX) 1000 MG tablet TAKE 2 TABLETS BY MOUTH TWICE A DAY FOR 1 DAY, START AT 1ST SIGN OF ONSET 05/13/13  Yes Eleanore E Egan, PA-C  VENTOLIN HFA 108 (90 BASE) MCG/ACT inhaler Inhale 2 puffs every 4-6 hrs as needed. PATIENT NEEDS OFFICE VISIT FOR ADDITIONAL REFILLS 01/26/14  Yes Theda Sers, PA-C   History   Social History  . Marital Status: Married  Spouse Name: N/A    Number of Children: N/A  . Years of Education: N/A   Occupational History  . Not on file.   Social History Main Topics  . Smoking status: Never Smoker   . Smokeless tobacco: Not on file  . Alcohol Use: No  . Drug Use: No  . Sexual Activity: Yes    Partners: Male    Birth Control/ Protection: None   Other Topics Concern  . Not on file   Social History Narrative  . No narrative on file   Review of Systems  Constitutional: Negative for fever and chills.  HENT: Positive for congestion and postnasal drip.   Respiratory: Positive for cough and shortness of  breath.    Triage vitals: BP 148/80  Pulse 88  Temp(Src) 98.7 F (37.1 C) (Oral)  Resp 16  Ht 5' 3.5" (1.613 m)  Wt 128 lb 12.8 oz (58.423 kg)  BMI 22.46 kg/m2  SpO2 98%  LMP 09/08/2013  Objective:  Physical Exam  Vitals reviewed. Constitutional: She is oriented to person, place, and time. She appears well-developed and well-nourished. No distress.  HENT:  Head: Normocephalic and atraumatic.  Right Ear: External ear normal.  Left Ear: External ear normal.  Nose: Mucosal edema (Left greater than right) present.  Mouth/Throat: Oropharynx is clear and moist.  Eyes: Conjunctivae and EOM are normal.  Neck: Neck supple. No tracheal deviation present.  Cardiovascular: Normal rate, regular rhythm, S1 normal, S2 normal and normal heart sounds.   No murmur heard. Pulmonary/Chest: Effort normal and breath sounds normal. No respiratory distress. She has no wheezes. She has no rhonchi. She has no rales.  Lungs clear to auscultation bilaterally. Good air movement throughout. Large amount of dry cough with forced expiration. No palpable muscle spasm or tenderness to palpation along area of pain at left 8th to 9th rib.  Musculoskeletal: Normal range of motion.  Neurological: She is alert and oriented to person, place, and time.  Skin: Skin is warm and dry.  Psychiatric: She has a normal mood and affect. Her behavior is normal.   Assessment & Plan:  Will start on Prednisone burst. If sxs worsen with fever, chills, or purulent cough; okay to call for Z-Pack. If SOB or wheezing, recommend come back in for CXR.  Acute bronchitis, unspecified organism  Asthma with acute exacerbation, mild intermittent  Meds ordered this encounter  Medications  . predniSONE (DELTASONE) 20 MG tablet    Sig: Take 2 tablets (40 mg total) by mouth daily with breakfast.    Dispense:  10 tablet    Refill:  0  . QVAR 80 MCG/ACT inhaler    Sig: Inhale 2 puff into the lungs bid as directed.    Dispense:  8.7 g     Refill:  5  . VENTOLIN HFA 108 (90 BASE) MCG/ACT inhaler    Sig: Inhale 2 puffs every 4-6 hrs as needed.    Dispense:  18 g    Refill:  11  . fluticasone (FLONASE) 50 MCG/ACT nasal spray    Sig: Place 2 sprays into both nostrils as needed for allergies.    Dispense:  16 g    Refill:  5    I personally performed the services described in this documentation, which was scribed in my presence. The recorded information has been reviewed and considered, and addended by me as needed.  Delman Cheadle, MD MPH

## 2014-03-21 ENCOUNTER — Encounter: Payer: Self-pay | Admitting: Certified Nurse Midwife

## 2014-03-28 ENCOUNTER — Telehealth: Payer: Self-pay | Admitting: Gynecology

## 2014-03-28 ENCOUNTER — Other Ambulatory Visit: Payer: Self-pay | Admitting: Gynecology

## 2014-03-28 MED ORDER — NORETHINDRONE ACETATE 5 MG PO TABS: 10.0000 mg | ORAL_TABLET | Freq: Every day | ORAL | Status: DC

## 2014-03-28 NOTE — Telephone Encounter (Signed)
The patient called and gave an update about why she is requesting this RX from [place]. She says that Dr. Primitivo Gauze office told her when she called to schedule a follow up appointment that Dr. Charlies Constable will need to follow up with the patient. The patient is getting mixed messages. Patient not sure what to do at this point but she does request a refill until this is worked out. Please see the last phone note for more details.  [facility]

## 2014-03-30 ENCOUNTER — Telehealth: Payer: Self-pay | Admitting: Emergency Medicine

## 2014-03-30 NOTE — Telephone Encounter (Signed)
Patient returned call.  She is given message from Dr. Charlies Constable. She is agreeable to the plan. She states her Mother passed away yesterday and she is completing funeral arrangements. She will call back to schedule office visit with Dr. Charlies Constable. Has annual exam coming up with Regina Eck CNM and wondering if she should keep as scheduled or can complete office visit with Dr. Charlies Constable and annual exam at the same time.  Tried to get appointment with Dr. Fermin Schwab and they would not allow her to schedule per patient. Patient states she will call back to schedule.

## 2014-03-30 NOTE — Telephone Encounter (Signed)
Message left to return call to [place] at [phone] for a message from Dr. Charlies Constable.   Rx for Agestin 5 mg sent to pharmacy 03/28/14 by Dr. Charlies Constable

## 2014-03-30 NOTE — Telephone Encounter (Signed)
Message copied by Donesha Wallander, Trellis Paganini on Thu Mar 30, 2014 11:17 AM ------      Message from: Elveria Rising      Created: Tue Mar 28, 2014  2:03 PM       INFORM PT RX SENT IN, BUT PLS ASK HER TO COME IN FOR OFFICE VISIT ------

## 2014-03-31 NOTE — Telephone Encounter (Signed)
Lmtcb//kn 

## 2014-03-31 NOTE — Telephone Encounter (Signed)
Can she just move her annual to me and then we can discuss? pls give my condolences re her mother.

## 2014-03-31 NOTE — Telephone Encounter (Signed)
Spoke with patient. Patient is agreeable to plan. Would like to call Monday to schedule annual with Dr.Lathrop. Patient states that she has been taking Provera and originally was taking three per day until bleeding stopped and then two per day. Patient states that she has been under a lot of stress lately and bleeding has started again. Would like to know if she is able to take three pills a day again. If so states she will need another rx until she can see Dr.Lathrop as she only has enough for two a day for one month. Advised would send a message to Dr.Lathrop and give patient a call back with further recommendations. Patient states she may not be able to answer phone and would like voicemail left at number provided.

## 2014-04-10 ENCOUNTER — Ambulatory Visit: Payer: 59 | Admitting: Certified Nurse Midwife

## 2014-04-10 NOTE — Telephone Encounter (Signed)
Patient has annual scheduled with Dr. Charlies Constable for 04/26/14  Name: Amy Phillips, Amy Phillips MRN: [id number]  Date: 04/26/2014 Status: Sch  Time: 11:30 AM  Length: 30   Visit Type: ANNUAL EXAM [1150]  Copay: $0.00  Provider: Azalia Bilis, MD Department: [facility]  Referring Provider: [name] CSN: [id number]  Notes: aex/nr//NP CXRS/GW/rs/[place]   Rescheduled:  04/10/2014 10:59 AM  By:  Leeroy Bock    Patient Demographics for OK[facility],XHFSF P [[id number]]  DOB: [birth year] SSN: [id number]  Age: 57 yrs Sex: Female  Home Phone:  Work Phone: [phone]  Address: [place]  E-Mail:   [place], [place] [id number]  [name] Comments: 10/17/13 Per patient always call both Home/Mobile Number

## 2014-04-11 ENCOUNTER — Telehealth: Payer: Self-pay

## 2014-04-11 NOTE — Telephone Encounter (Signed)
Patient states she is still feeling bad. States she needs another rx for the steroids that she was prescribed. Call into [facility]. CB # [phone]

## 2014-04-12 NOTE — Telephone Encounter (Signed)
LMOM for pt to CB. Need to advise pt that if she is not better, she definitely needs to be re-checked. She could have developed PNA or need some other type of medication.

## 2014-04-12 NOTE — Telephone Encounter (Signed)
Pt CB and reported that she doesn't actually feel bad, no fever, no fatique. Stated that she got over the cold that she had when she was in, but still is having the RAD/asthma Sxs of cough and SOB that is irritating. She has been using the QVAR BID and the albuterol Q 4 hrs or slightly longer. Pt stated that Dr Brigitte Pulse told her to [facility] if didn't completely resolve. Pt has been treated in the past w/either 60 mg prednisone tapered, or a longer round and thinks that maybe that is what she needs again to get this under control. Dr Brigitte Pulse, please advise.

## 2014-04-13 MED ORDER — PREDNISONE 10 MG PO TABS: ORAL_TABLET | ORAL | Status: DC

## 2014-04-13 NOTE — Telephone Encounter (Signed)
60mg  pred taper sent in to pharm [facility] o/p. RTC immed if worsening or sxs don't resolve.

## 2014-04-14 ENCOUNTER — Ambulatory Visit: Payer: 59 | Admitting: Certified Nurse Midwife

## 2014-04-14 NOTE — Telephone Encounter (Signed)
Pt advised.

## 2014-04-25 ENCOUNTER — Other Ambulatory Visit: Payer: Self-pay | Admitting: Gynecology

## 2014-04-25 NOTE — Telephone Encounter (Signed)
Pt has an appt tomorrow (04/26/14) with Dr. Charlies Constable. Refill can be done then Encounter closed

## 2014-04-26 ENCOUNTER — Encounter: Payer: Self-pay | Admitting: Gynecology

## 2014-04-26 ENCOUNTER — Ambulatory Visit (INDEPENDENT_AMBULATORY_CARE_PROVIDER_SITE_OTHER): Payer: 59 | Admitting: Gynecology

## 2014-04-26 VITALS — BP 130/80 | HR 85 | Resp 14 | Ht 63.0 in | Wt 126.0 lb

## 2014-04-26 DIAGNOSIS — Z01419 Encounter for gynecological examination (general) (routine) without abnormal findings: Secondary | ICD-10-CM

## 2014-04-26 DIAGNOSIS — F411 Generalized anxiety disorder: Secondary | ICD-10-CM

## 2014-04-26 DIAGNOSIS — Z0000 Encounter for general adult medical examination without abnormal findings: Secondary | ICD-10-CM

## 2014-04-26 DIAGNOSIS — N950 Postmenopausal bleeding: Secondary | ICD-10-CM

## 2014-04-26 DIAGNOSIS — F4322 Adjustment disorder with anxiety: Secondary | ICD-10-CM

## 2014-04-26 DIAGNOSIS — D252 Subserosal leiomyoma of uterus: Secondary | ICD-10-CM

## 2014-04-26 DIAGNOSIS — D250 Submucous leiomyoma of uterus: Secondary | ICD-10-CM

## 2014-04-26 LAB — POCT URINALYSIS DIPSTICK
Leukocytes, UA: NEGATIVE
Urobilinogen, UA: NEGATIVE
pH, UA: 7

## 2014-04-26 LAB — HEMOGLOBIN, FINGERSTICK: Hemoglobin, fingerstick: 14 g/dL (ref 12.0–16.0)

## 2014-04-26 MED ORDER — NORETHINDRONE ACETATE 5 MG PO TABS: 10.0000 mg | ORAL_TABLET | Freq: Every day | ORAL | Status: DC

## 2014-04-26 MED ORDER — ALPRAZOLAM 0.25 MG PO TABS: ORAL_TABLET | ORAL | Status: DC

## 2014-04-26 MED ORDER — VENLAFAXINE HCL ER 37.5 MG PO CP24: 37.5000 mg | ORAL_CAPSULE | Freq: Every day | ORAL | Status: DC

## 2014-04-26 NOTE — Progress Notes (Signed)
58 y.o. Married Caucasian female   [phone] here for annual exam. Pt reports menses are not regular.  She does report hot flashes, does have night sweats, does not have vaginal dryness.  She is not using lubricants.  She does report post-menopasual bleeding.  Pt states that she wears a pad and it does not get full.  She was seen by gyn-onc who felt that she should be managed by [place].  Pt did not have PMB while on HRT but stopped estrogen due to percieved increase in size of larger fibroids.  EMB inactive endometrium and surgical pathology was benign fibroids and weakly proliferative.  Pt is under a lot of stress and does not want to do a hysterectomy unless absolutely necessary.  Pt's mother recently passed after a short but intense illness, she is now dealing with estate issues.     Patient's last menstrual period was 09/08/2013.          Sexually active: No.  The current method of family planning is post menopausal status.    Exercising: No.  The patient does not participate in regular exercise at present. Last pap: 04/02/12 NEG HR HPV Abnormal PAP: no Mammogram: 11/21/09 Bi-Rads 1 BSE: yes Colonoscopy: 07/2012 Normal f/u 5 years  DEXA: never had one Alcohol: no Tobacco: no  Hgb: 14.0  ; Urine: Negative  Health Maintenance  Topic Date Due  . Pap Smear  05/26/1975  . Mammogram  05/26/2007  . Influenza Vaccine  04/22/2014  . Tetanus/tdap  09/22/2016  . Colonoscopy  07/26/2022    Family History  Problem Relation Age of Onset  . Arthritis Mother   . Osteoporosis Mother   . Breast cancer Mother   . Cancer Mother     lung  . COPD Mother   . Cancer Father     lung  . COPD Father   . Heart disease Father   . Hypertension Sister   . Heart disease Sister   . Arthritis Sister   . Hypertension Brother     Patient Active Problem List   Diagnosis Date Noted  . Fibroids 05/24/2013  . Post-menopausal bleeding 05/24/2013  . Diverticulosis 07/27/2012  . Colon polyps 07/27/2012  .  HYPERLIPIDEMIA-MIXED 10/04/2010  . PALPITATIONS 10/04/2010  . DYSPNEA 10/04/2010  . DYSPNEA ON EXERTION 10/04/2010    Past Medical History  Diagnosis Date  . Allergy   . Asthma   . Anxiety   . Arrhythmia   . GERD (gastroesophageal reflux disease)   . Uterine fibroid 2006    16 week size  . Diverticulitis 08/05/2012    Dr Collene Mares    Past Surgical History  Procedure Laterality Date  . Dilation and curettage of uterus      D&E x2 missed ab  . Dilatation & curettage/hysteroscopy with trueclear N/A 09/08/2013    Procedure: DILATATION & CURETTAGE/HYSTEROSCOPY WITH TRUCLEAR;  Surgeon: Azalia Bilis, MD;  Location: [facility];  Service: Gynecology;  Laterality: N/A;  . Upper gi endoscopy  07/23/12    grade 1 esophagitis noted:  otherwise normal esophagogastrduodenoscopy    Allergies: Review of patient's allergies indicates no known allergies.  Current Outpatient Prescriptions  Medication Sig Dispense Refill  . ALPRAZolam (XANAX) 0.25 MG tablet TAKE 1 TABLET BY MOUTH AT BEDTIME AS NEEDED  30 tablet  2  . fluticasone (FLONASE) 50 MCG/ACT nasal spray Place 2 sprays into both nostrils as needed for allergies.  16 g  5  . norethindrone (AYGESTIN) 5 MG tablet Take 2  tablets (10 mg total) by mouth daily.  60 tablet  0  . predniSONE (DELTASONE) 10 MG tablet 6-5-4-3-2-1 tab po qd taper x 6d  21 tablet  0  . Probiotic Product (PROBIOTIC PO) Take 1 tablet by mouth 2 (two) times daily.       Marland Kitchen QVAR 80 MCG/ACT inhaler Inhale 2 puff into the lungs bid as directed.  8.7 g  5  . valACYclovir (VALTREX) 1000 MG tablet TAKE 2 TABLETS BY MOUTH TWICE A DAY FOR 1 DAY, START AT 1ST SIGN OF ONSET  30 tablet  1  . VENTOLIN HFA 108 (90 BASE) MCG/ACT inhaler Inhale 2 puffs every 4-6 hrs as needed.  18 g  11   No current facility-administered medications for this visit.    ROS: Pertinent items are noted in HPI.  Exam:    BP 130/80  Pulse 85  Resp 14  Ht 5\' 3"  (1.6 m)  Wt 126 lb (57.153 kg)  BMI 22.33  kg/m2  LMP 09/08/2013 Weight change: @WEIGHTCHANGE @ Last 3 height recordings:  Ht Readings from Last 3 Encounters:  04/26/14 5\' 3"  (1.6 m)  03/20/14 5' 3.5" (1.613 m)  12/02/13 5' 2.76" (1.594 m)   General appearance: alert, cooperative and appears stated age Head: Normocephalic, without obvious abnormality, atraumatic Neck: no adenopathy, no carotid bruit, no JVD, supple, symmetrical, trachea midline and thyroid not enlarged, symmetric, no tenderness/mass/nodules Lungs: clear to auscultation bilaterally Breasts: normal appearance, no masses or tenderness Heart: regular rate and rhythm, S1, S2 normal, no murmur, click, rub or gallop Abdomen: soft, non-tender; bowel sounds normal; no masses,  no organomegaly Extremities: extremities normal, atraumatic, no cyanosis or edema Skin: Skin color, texture, turgor normal. No rashes or lesions Lymph nodes: Cervical, supraclavicular, and axillary nodes normal. no inguinal nodes palpated Neurologic: Grossly normal   Pelvic: External genitalia:  no lesions              Urethra: normal appearing urethra with no masses, tenderness or lesions              Bartholins and Skenes: Bartholin's, Urethra, Skene's normal                 Vagina: normal appearing vagina with normal color and discharge, no lesions              Cervix: no lesions              Pap taken: No.        Bimanual Exam:  Uterus:  enlarged to 18-20 week's size                                      Adnexa:    Not palpable                                      Rectovaginal: Confirms                                      Anus:  normal sphincter tone, no lesions    1. Routine gynecological examination Mammogram overdue!! Stressed importance of f/u counseled on breast self exam, adequate intake of calcium and vitamin D, diet and exercise return annually or prn Discussed PAP guideline changes,  importance of weight bearing exercises, calcium, vit D and balanced diet.    2. Laboratory  examination ordered as part of a routine general medical examination  - Hemoglobin, fingerstick - POCT Urinalysis Dipstick  3. Submucous leiomyoma of uterus By exam. Uterus unchanged, evaluate ovaries - US Transvaginal Non-OB; Future  4. Adjustment disorder with anxious mood Stress from mother's affairs - venlafaxine XR (EFFEXOR XR) 37.5 MG 24 hr capsule; Take 1 capsule (37.5 mg total) by mouth daily with breakfast.  Dispense: 30 capsule; Refill: 12  5. Anxiety state, unspecified  - venlafaxine XR (EFFEXOR XR) 37.5 MG 24 hr capsule; Take 1 capsule (37.5 mg total) by mouth daily with breakfast.  Dispense: 30 capsule; Refill: 12 - ALPRAZolam (XANAX) 0.25 MG tablet; TAKE 1 TABLET BY MOUTH AT BEDTIME AS NEEDED  Dispense: 30 tablet; Refill: 2  6. Subserous leiomyoma of uterus Seen and released by gyn-onc, may want to consider [place]  - Ambulatory referral to Interventional Radiology - norethindrone (AYGESTIN) 5 MG tablet; Take 2 tablets (10 mg total) by mouth daily.  Dispense: 60 tablet; Refill: 0  7. Post-menopausal bleeding Did well on estrogen and progestin, will assess at u/s and consider estrogen to stabilize  - norethindrone (AYGESTIN) 5 MG tablet; Take 2 tablets (10 mg total) by mouth daily.  Dispense: 60 tablet; Refill: 0  An After Visit Summary was printed and given to the patient.   Additional 25m spent counseling re uterine fibroids and treatment, >50%  Face to face

## 2014-05-03 ENCOUNTER — Telehealth: Payer: Self-pay | Admitting: Gynecology

## 2014-05-03 ENCOUNTER — Other Ambulatory Visit: Payer: Self-pay | Admitting: Gynecology

## 2014-05-03 DIAGNOSIS — D259 Leiomyoma of uterus, unspecified: Secondary | ICD-10-CM

## 2014-05-03 NOTE — Telephone Encounter (Signed)
Spoke with patient. Advised that per benefit quote received, she will be responsible for $25 copay when she comes in for PUS. Patient agreeable. °Scheduled PUS. °Advised patient of 72 hour cancellation policy and $100 cancellation fee. Patient agreeable. °

## 2014-05-16 ENCOUNTER — Ambulatory Visit (INDEPENDENT_AMBULATORY_CARE_PROVIDER_SITE_OTHER): Payer: 59

## 2014-05-16 ENCOUNTER — Ambulatory Visit (INDEPENDENT_AMBULATORY_CARE_PROVIDER_SITE_OTHER): Payer: 59 | Admitting: Gynecology

## 2014-05-16 ENCOUNTER — Other Ambulatory Visit: Payer: Self-pay | Admitting: Gynecology

## 2014-05-16 VITALS — BP 124/78 | Resp 14 | Ht 63.0 in | Wt 124.0 lb

## 2014-05-16 DIAGNOSIS — D250 Submucous leiomyoma of uterus: Secondary | ICD-10-CM

## 2014-05-16 DIAGNOSIS — N950 Postmenopausal bleeding: Secondary | ICD-10-CM

## 2014-05-16 NOTE — Progress Notes (Signed)
       Pt here for f/u PUS for assess fibroids. Images reviewed with pt and compared with previous u/s's. Pt was seen by onc who felt case to be benign. Pt reports bleeding seems to be stress related, she does feel like the fibroids have grown, but still wants to defer surgery if at all possible. EMS thin 3.07, uterine size overall larger-20x19 with larger 2 fibroids also larger 10x8 and 8x7cm.   Pt has appt with interventional radiology this week and would like to explore that option, she is aware that she may not be a candidate based on size, and if so, she is ok with proceeding to hysterectomy.  She feels that she must exhaust her options before OR. 80m spent counseling, >50% face to face

## 2014-05-18 ENCOUNTER — Other Ambulatory Visit (HOSPITAL_COMMUNITY): Payer: Self-pay | Admitting: Interventional Radiology

## 2014-05-18 ENCOUNTER — Other Ambulatory Visit: Payer: Self-pay | Admitting: Radiology

## 2014-05-18 ENCOUNTER — Ambulatory Visit
Admission: RE | Admit: 2014-05-18 | Discharge: 2014-05-18 | Disposition: A | Discharge status: Home / Self Care | Payer: 59 | Source: Ambulatory Visit | Attending: Gynecology | Admitting: Gynecology

## 2014-05-18 ENCOUNTER — Ambulatory Visit
Admission: RE | Admit: 2014-05-18 | Discharge: 2014-05-18 | Disposition: A | Discharge status: Home / Self Care | Payer: 59 | Source: Ambulatory Visit | Attending: Interventional Radiology | Admitting: Interventional Radiology

## 2014-05-18 DIAGNOSIS — T184XXD Foreign body in colon, subsequent encounter: Principal | ICD-10-CM

## 2014-05-18 DIAGNOSIS — T183XXD Foreign body in small intestine, subsequent encounter: Secondary | ICD-10-CM

## 2014-05-18 DIAGNOSIS — D259 Leiomyoma of uterus, unspecified: Secondary | ICD-10-CM

## 2014-05-19 ENCOUNTER — Telehealth: Payer: Self-pay | Admitting: Gynecology

## 2014-05-19 DIAGNOSIS — N950 Postmenopausal bleeding: Secondary | ICD-10-CM

## 2014-05-19 DIAGNOSIS — D252 Subserosal leiomyoma of uterus: Secondary | ICD-10-CM

## 2014-05-19 MED ORDER — NORETHINDRONE ACETATE 5 MG PO TABS: 10.0000 mg | ORAL_TABLET | Freq: Every day | ORAL | Status: DC

## 2014-05-19 NOTE — Telephone Encounter (Signed)
Routing to Dr.Lathrop for review and advise.

## 2014-05-19 NOTE — Telephone Encounter (Signed)
Spoke with patient. Advised Dr.Lathrop sent refill for rx to pharmacy on file. Patient agreeable and verbalizes understanding.  Routing to provider for final review. Patient agreeable to disposition. Will close encounter ;

## 2014-05-19 NOTE — Telephone Encounter (Signed)
Pt said she needs refill of   norethindrone (AYGESTIN) 5 MG tablet  Take 2 tablets (10 mg total) by mouth daily., Starting 04/26/2014, Until Discontinued, Normal, Last Dose: Not Recorded  Refills: 0 ordered Pharmacy: [facility] [place] - [address] says that tracy was thinking about stopping the medication but the radiologist wanted her to continue it until she has her mri and he can have the appropriate evaluation.

## 2014-06-22 ENCOUNTER — Telehealth: Payer: Self-pay | Admitting: *Deleted

## 2014-06-22 DIAGNOSIS — N950 Postmenopausal bleeding: Secondary | ICD-10-CM

## 2014-06-22 DIAGNOSIS — D252 Subserosal leiomyoma of uterus: Secondary | ICD-10-CM

## 2014-06-22 NOTE — Telephone Encounter (Addendum)
Return call from patient.  Pt states she has been to Radiology for consultation and needs lab work prior to having MRI.  Pt wanted to get past October prior to having MRI and possible procedure.  States radiologist does not want her to change any medications prior to MRI.  Pt has 3 pills left.  She will need RX prior to the weekend.  Pt is getting lab work tomorrow. Please advise refills.    Pt aware Dr. Charlies Constable out of office until 06/23/14.

## 2014-06-22 NOTE — Telephone Encounter (Signed)
Faxed refill request received from [facility] for AYGESTIN Last filled by MD on 05/19/14, #60 X 0 (30 DAY SUPPLY)  Phone call to patient.  Per phone note of 05/19/14, pt needed refill until MRI appt.  No results or appts in EPIC at this time.   Message left for patient to return call regarding refill.

## 2014-06-22 NOTE — Telephone Encounter (Signed)
Pt is calling stephanie back °

## 2014-06-23 LAB — CREATININE WITH EST GFR
Creat: 0.85 mg/dL (ref 0.50–1.10)
GFR, Est African American: 88 mL/min
GFR, Est Non African American: 76 mL/min

## 2014-06-23 LAB — BUN: BUN: 16 mg/dL (ref 6–23)

## 2014-06-26 ENCOUNTER — Other Ambulatory Visit (HOSPITAL_COMMUNITY): Payer: Self-pay | Admitting: Interventional Radiology

## 2014-06-26 DIAGNOSIS — D259 Leiomyoma of uterus, unspecified: Secondary | ICD-10-CM

## 2014-06-26 DIAGNOSIS — N939 Abnormal uterine and vaginal bleeding, unspecified: Secondary | ICD-10-CM

## 2014-06-26 MED ORDER — NORETHINDRONE ACETATE 5 MG PO TABS: 10.0000 mg | ORAL_TABLET | Freq: Every day | ORAL | Status: DC

## 2014-06-26 NOTE — Telephone Encounter (Signed)
Pt calling regarding the Rx refill she spoke with you about on Thursday.

## 2014-06-26 NOTE — Telephone Encounter (Addendum)
Return cal from patient.  She had labs on Friday, she is scheduled for MRI on 07/04/14.  She will have consult with radiology following MRI.  IF embolization is option, surgery will not happen until November or December of this year.   Please advise refills on Aygestin.  Pt is completely out of meds and will be driving in from [place] this PM to pick up.

## 2014-06-27 NOTE — Telephone Encounter (Signed)
I spoke with Dr. Charlies Constable on 06/26/14 who approved refill for 30 days.  Refill authorization sent to [facility].  Pt calls office today stating medication is not at pharmacy.   Pt is now bleeding heavily and passing clots.  States she will need to take 3 tabs daily until bleeding stops. Rx will run out prior to 30 days. I spoke to [place] and Aygestin is ready to be picked up.  Pt notified med is ready.  She will pick up today.

## 2014-07-04 ENCOUNTER — Ambulatory Visit (HOSPITAL_COMMUNITY)
Admission: RE | Admit: 2014-07-04 | Discharge: 2014-07-04 | Disposition: A | Discharge status: Home / Self Care | Payer: 59 | Source: Ambulatory Visit | Attending: Interventional Radiology | Admitting: Interventional Radiology

## 2014-07-04 DIAGNOSIS — D259 Leiomyoma of uterus, unspecified: Secondary | ICD-10-CM | POA: Insufficient documentation

## 2014-07-04 DIAGNOSIS — N939 Abnormal uterine and vaginal bleeding, unspecified: Secondary | ICD-10-CM | POA: Insufficient documentation

## 2014-07-04 MED ORDER — GADOBENATE DIMEGLUMINE 529 MG/ML IV SOLN
11.0000 mL | Freq: Once | INTRAVENOUS | Status: AC | PRN
  Administered 2014-07-04: 11 mL via INTRAVENOUS

## 2014-07-18 ENCOUNTER — Ambulatory Visit
Admission: RE | Admit: 2014-07-18 | Discharge: 2014-07-18 | Disposition: A | Discharge status: Home / Self Care | Payer: 59 | Source: Ambulatory Visit | Attending: Interventional Radiology | Admitting: Interventional Radiology

## 2014-07-18 DIAGNOSIS — N939 Abnormal uterine and vaginal bleeding, unspecified: Secondary | ICD-10-CM

## 2014-07-18 DIAGNOSIS — D259 Leiomyoma of uterus, unspecified: Secondary | ICD-10-CM

## 2014-07-18 NOTE — Progress Notes (Signed)
Patient ID: Amy Phillips, female   DOB: [birth year], 57 y.o.   MRN: [id number]   Chief Complaint: Systematic uterine fibroids with recurrent abnormal postmenopausal bleeding  Referring Physician(s): Lathrop  History of Present Illness: Amy Phillips is a 58 y.o. female with abnormal postmenopausal menstrual bleeding. She returns to review her MRI scan from 07/04/2014. She continues to have nearly continuous abnormal menstrual bleeding. No significant abdominal pain. Enlarged fibroid uterus remains nontender. She continues to have mild bulk related pelvic discomfort. No definite urinary tract symptoms or constipation. No fevers. MRI from 07/04/2014 demonstrates a massively enlarged fibroid uterus with several uterine fibroids. All uterine fibroids demonstrate significant vascular flow with enhancement. No significant fibroid degeneration. The uterus measures 21 x 20 x 10 cm.  Past Medical History  Diagnosis Date  . Allergy   . Asthma   . Anxiety   . Arrhythmia   . GERD (gastroesophageal reflux disease)   . Uterine fibroid 2006    16 week size  . Diverticulitis 08/05/2012    Dr Collene Mares    Past Surgical History  Procedure Laterality Date  . Dilation and curettage of uterus      D&E x2 missed ab  . Dilatation & curettage/hysteroscopy with trueclear N/A 09/08/2013    Procedure: DILATATION & CURETTAGE/HYSTEROSCOPY WITH TRUCLEAR;  Surgeon: Azalia Bilis, MD;  Location: [facility];  Service: Gynecology;  Laterality: N/A;  . Upper gi endoscopy  07/23/12    grade 1 esophagitis noted:  otherwise normal esophagogastrduodenoscopy    Allergies: Review of patient's allergies indicates no known allergies.  Medications: Prior to Admission medications   Medication Sig Start Date End Date Taking? Authorizing Provider  ALPRAZolam Duanne Moron) 0.25 MG tablet TAKE 1 TABLET BY MOUTH AT BEDTIME AS NEEDED 04/26/14   Azalia Bilis, MD  doxycycline (ADOXA) 100 MG tablet Take 100 mg by mouth daily as needed.     Historical Provider, MD  fluticasone (FLONASE) 50 MCG/ACT nasal spray Place 2 sprays into both nostrils as needed for allergies. 03/20/14   Shawnee Knapp, MD  norethindrone (AYGESTIN) 5 MG tablet Take 2 tablets (10 mg total) by mouth daily. 06/26/14   Azalia Bilis, MD  predniSONE (DELTASONE) 10 MG tablet 6-5-4-3-2-1 tab po qd taper x 6d 04/13/14   Shawnee Knapp, MD  Probiotic Product (PROBIOTIC PO) Take 1 tablet by mouth 2 (two) times daily.     Historical Provider, MD  QVAR 80 MCG/ACT inhaler Inhale 2 puff into the lungs bid as directed. 03/20/14   Shawnee Knapp, MD  valACYclovir (VALTREX) 1000 MG tablet TAKE 2 TABLETS BY MOUTH TWICE A DAY FOR 1 DAY, START AT 1ST SIGN OF ONSET 05/13/13   Theda Sers, PA-C  venlafaxine XR (EFFEXOR XR) 37.5 MG 24 hr capsule Take 1 capsule (37.5 mg total) by mouth daily with breakfast. 04/26/14   Azalia Bilis, MD  VENTOLIN HFA 108 (90 BASE) MCG/ACT inhaler Inhale 2 puffs every 4-6 hrs as needed. 03/20/14   Shawnee Knapp, MD    Family History  Problem Relation Age of Onset  . Arthritis Mother   . Osteoporosis Mother   . Breast cancer Mother   . Cancer Mother     lung  . COPD Mother   . Cancer Father     lung  . COPD Father   . Heart disease Father   . Hypertension Sister   . Heart disease Sister   . Arthritis Sister   . Hypertension Brother  History   Social History  . Marital Status: Married    Spouse Name: N/A    Number of Children: N/A  . Years of Education: N/A   Social History Main Topics  . Smoking status: Never Smoker   . Smokeless tobacco: Not on file  . Alcohol Use: No  . Drug Use: No  . Sexual Activity: Yes    Partners: Male    Birth Control/ Protection: None   Other Topics Concern  . Not on file   Social History Narrative  . No narrative on file    Review of Systems: A 12 point ROS discussed and pertinent positives are indicated in the HPI above.  All other systems are negative.  Review of Systems  Constitutional: Negative for  fever, activity change, appetite change and fatigue.  Respiratory: Negative for chest tightness.   Cardiovascular: Negative for chest pain.  Gastrointestinal: Negative for abdominal distention.  Genitourinary: Negative for dysuria, hematuria, flank pain and pelvic pain.    Vital Signs: BP 146/85  Pulse 86  Temp(Src) 98.3 F (36.8 C) (Oral)  Resp 15  SpO2 99%  LMP 04/26/2014  Physical Exam Deferred today.  She is here to review the MRI of the pelvis and discuss treatment options  Imaging: Mr Pelvis W Wo Contrast  07/04/2014   CLINICAL DATA:  Initial encounter for urine fibroids. Pre uterine artery embolization evaluation.  EXAM: MRI PELVIS WITHOUT AND WITH CONTRAST  TECHNIQUE: Multiplanar multisequence MR imaging of the pelvis was performed both before and after administration of intravenous contrast.  CONTRAST:  97mL MULTIHANCE GADOBENATE DIMEGLUMINE 529 MG/ML IV SOLN  COMPARISON:  Ultrasound 05/16/2014  FINDINGS: The uterus is massively enlarged measuring 21 cm in craniocaudad dimension and 19.6 by 9.6 cm in axial dimension. The myometrium of the uterus is enlarged by a multiple round heterogeneous low intensity lesions on T2 weighted imaging consistent with leiomyomas. There are 2 dominant leiomyomas within the left and right cornu region. The right lesion measuring 8.7 x 8.2 x11.4 cm and the left lesion measuring 7.9 x 7.8 x 9.7 cm. There are multiple additional intramural leiomyoma in the uterine body and lower uterine segment ranging in size from 2.5 to 4.0 cm.  The endometrial canal is difficult to define but there does not appear to be submucosal lesions. There is no evidence of thickening of the junctional zone.  On the post-contrast T1 weighted series, all of the leiomyomas enhance uniformly.  The ovaries are difficult to define. The right ovary is likely present image 28 series 5. The left ovary is not confidently identified.  There is no variant arterial anatomy. There is venous  vascular congestion in the lower pelvis. Bladder is normal.  IMPRESSION: 1. Uterus is massively enlarged by multiple intramural leiomyoma which enhance avidly. 2. No evidence of submucosal leiomyoma. 3. No evidence of adenomyosis. 4. No variant vascular anatomy identified.   Electronically Signed   By: Suzy Bouchard M.D.   On: 07/04/2014 16:00    Assessment and Plan:  MRI confirms a massively enlarged fibroid uterus measuring 21 x 20 x 10 cm. All the fibroids demonstrate intense vascular flow with enhancement. No significant fibroid degeneration. 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 Therefore, I would recommend surgical management over uterine fibroid embolization in this patient, because the recovery time actually may be more difficult with embolization. She had  several questions which were fully addressed and answered. She has a clear understanding of the treatment options. She plans to reconsider hysterectomy with her OB/GYN.    I spent a total of 30 minutes face to face in clinical consultation, greater than 50% of which was counseling/coordinating care for the patient.  SignedGreggory Keen T. 07/18/2014, 1:15 PM

## 2014-07-19 ENCOUNTER — Telehealth: Payer: Self-pay | Admitting: Gynecology

## 2014-07-19 NOTE — Telephone Encounter (Signed)
LM for pt to call back regarding appt with IR yesterday. ( Per IR not candidate for embolization.  Would like to refer to [facility] for hysterectomy)

## 2014-07-20 ENCOUNTER — Other Ambulatory Visit: Payer: Self-pay | Admitting: *Deleted

## 2014-07-21 ENCOUNTER — Other Ambulatory Visit: Payer: Self-pay | Admitting: Gynecology

## 2014-07-21 ENCOUNTER — Other Ambulatory Visit: Payer: Self-pay | Admitting: Emergency Medicine

## 2014-07-21 MED ORDER — NORETHINDRONE ACETATE 5 MG PO TABS: 10.0000 mg | ORAL_TABLET | Freq: Two times a day (BID) | ORAL | Status: DC

## 2014-07-21 NOTE — Telephone Encounter (Signed)
Spoke with patient. Patient is requesting refill on Aygestin 5mg . "I do not understand why I can not get refills on this medication. Last time I called for a refill on a Thursday and it did not get refilled until the Tuesday or Wednesday of the next week. In the meantime I ran out of pills and started bleeding heavily with a lot of clots. I had to start taking the medication 3 times a day to stop the bleeding. I am now taking it twice a day but as a result I need a refill sooner. My prescription will run out over the weekend and I do not want to go through that again." Patient was also seen with [facility] radiology and was advised she is not a candidate for a procedure we recommended due to having a "vascular uterus." Patient would like to proceed with hysterectomy at this time. Offered to have patient come in to meet with one of the doctors in our practice for a consult for surgery. Patient declines. "It is nothing against them. I do not know them but this problem came after I had a procedure with Dr.Lathrop. Dr.Romine was also following me before and told me my uterus was not growing. When I had my ultrasound it had tripled in size. This is something that could have been handled a while ago.I have gotten recommendations of two other doctors to have this done with. I want to go some where I feel confident."  Patient would like to see Dr.Cousins or Dr.Haygood. Advised patient will speak with Dr.Miller about refills of Aygestin and referrals being placed for surgery and return call. Patient is agreeable. Please see next telephone entry from Lamont Snowball, RN with return call to patient.  Cc: Lamont Snowball, RN

## 2014-07-21 NOTE — Telephone Encounter (Signed)
Pt states she was returning a call from dr lathrop. But i dont see a message. Pt asked if she could be called this morning

## 2014-07-21 NOTE — Telephone Encounter (Signed)
Called [facility] and DC rx with pharmacy technician Wolverine. She will void the new rx.  Will wait for updated order from Dr. Sabra Heck.

## 2014-07-21 NOTE — Telephone Encounter (Signed)
Last AEX 04/26/14 Last refill 06/26/14 #60/0R Next appt 05/01/15  Please advise

## 2014-07-21 NOTE — Telephone Encounter (Signed)
Please contact patient and make an appointment for me to see her.  She needs re-evaluation.  I am going to approve the Aygestin for 2 weeks only at this time.  I will send in the order.   [place]

## 2014-07-21 NOTE — Telephone Encounter (Signed)
Left detailed message for patient that rx for Aygestin 10 mg has been sent to [facility] on file. Advised patient she is to take 10mg  two times daily. If rx only comes in 5 mg will need to take two tablets twice per day. Advised to return call on Monday if any further questions.   Routing to provider for final review. Patient agreeable to disposition. Will close encounter

## 2014-07-21 NOTE — Telephone Encounter (Signed)
Call to patient at 1248 pm. Advised chart reviewed by managing physician, Dr Sabra Heck. Can offer to see patient here to discuss surgery options or refer to [facility]- whichever patient prefers.She states Dr Charlies Constable has called her today, really recommend she go to [facility]. IR recommended Dr Garwin Brothers. [facility] recommended Dr Leo Grosser. Patient feels confused/frustrated. Advised she may meet with all of the providers and then decide on who she feels most comfortable with. We are happy to assist her with referral to [facility] if desires. Advised of referral  process to [facility], takes 10 days to 2 week as MD reviews records first.   Patient reports that she has continued to have bleeding since procedure with dr Charlies Constable, Aygestin increased to three times daily after surgery. Had to do this in past for about three weeks and "pharmacist told her she could increase it" to control bleeding. When she runs out of med, bleeding restarts and takes several weeks to get back under control. Has only three pills left and will need refill to keep from having bleeding increase again.  Anxious about getting refill today since this has taken several days to complete in the past. Advised Dr Sabra Heck has agreed to refill Aygestin but will need to check on the TID dose. Patient does not think she wants to have surgery with our office but would like [place] to continue management with Aygestin until she decides on surgery. Offered OV to discuss with Dr Sabra Heck, review all findings and make a plan. Will begin referral  process to [facility] and patient cancel if chooses local option. Requests we call Dr Garwin Brothers and [facility] for appointments as well. Will try to have this by appointment time on Monday at 4pm.  Please advise on Aygestin three times daily?

## 2014-07-21 NOTE — Telephone Encounter (Signed)
Aygestin 10mg  bid.  Rx to pharmacy.

## 2014-07-24 ENCOUNTER — Ambulatory Visit (INDEPENDENT_AMBULATORY_CARE_PROVIDER_SITE_OTHER): Payer: 59 | Admitting: Obstetrics & Gynecology

## 2014-07-24 ENCOUNTER — Encounter: Payer: Self-pay | Admitting: Gynecology

## 2014-07-24 VITALS — BP 134/88 | HR 78 | Resp 16 | Ht 63.0 in | Wt 126.0 lb

## 2014-07-24 DIAGNOSIS — D251 Intramural leiomyoma of uterus: Secondary | ICD-10-CM

## 2014-07-24 DIAGNOSIS — N852 Hypertrophy of uterus: Secondary | ICD-10-CM

## 2014-07-24 DIAGNOSIS — N950 Postmenopausal bleeding: Secondary | ICD-10-CM

## 2014-07-25 ENCOUNTER — Other Ambulatory Visit: Payer: Self-pay | Admitting: Gynecology

## 2014-07-25 ENCOUNTER — Encounter: Payer: Self-pay | Admitting: Internal Medicine

## 2014-07-25 NOTE — Telephone Encounter (Signed)
07/21/14 #60/0 Rfs was sent to [facility] patient prefers [facility].  Rx sent to [facility] #60/0 rfs

## 2014-07-28 ENCOUNTER — Encounter: Payer: Self-pay | Admitting: Obstetrics & Gynecology

## 2014-07-28 DIAGNOSIS — D251 Intramural leiomyoma of uterus: Secondary | ICD-10-CM | POA: Insufficient documentation

## 2014-07-28 DIAGNOSIS — N852 Hypertrophy of uterus: Secondary | ICD-10-CM | POA: Insufficient documentation

## 2014-07-28 DIAGNOSIS — N950 Postmenopausal bleeding: Secondary | ICD-10-CM | POA: Insufficient documentation

## 2014-07-28 NOTE — Progress Notes (Signed)
Subjective:     Patient ID: Amy Phillips, female   DOB: 10-29-1955, 58 y.o.   MRN: [id number]  HPI 58 yo [id number] MWF here for discussion of enlarging fibroid uterus and to discuss options.  Pt was long term pt of Dr. Julieta Bellini with known hx of fibroids.  Reports was always told by Dr. Joan Flores "if nothing is changing, then fibroids can be watched".  Pt did go for several years without a lot of change in the size of her uterus.  Long standing hx of fibroids.  Pt reports two miscarriages were due to fibroids.  She has been very resistant to having anything done.  Pt was on HRT with Dr. Joan Flores using Vivelle dot and oral progesterone.    Then last fall, pt had an episode of PMP bleeding around late August.  Seen, in office, by Dr. Charlies Constable 05/24/14.  Multiple fibroids noted with largest fibroids noted as well.  Uterus was at least 15cm x 15cm x 8cm.  Largest fibroids were 6 and 7cm.  Two small submucosal fibroids noted.  Endometrial biopsy was negative for abnormal cells 05/26/13.  Dr. Charlies Constable attempted hysteroscopy with fibroid resection 12/14 as patient declined hysterectomy and was having lots of family issues, specifically very ill mother.    Surgery was difficult due to poor distention and full resection of fibroids was not performed.  Pt had continued bleeding post operatively.  HRT was stopped late December.  Pt continued to have postmenopausal/post procedure bleeding but still declined hysterectomy.  Pt was started on Aygestin and referred to Dr. Fermin Schwab who recommended continuing Aygestin, as it helped, but for short term.  Repeat PUS 11/22/13 showed no significant change in uterine size or fibroids.  Pt's mother died around this time so personal life was very stressful.   In August, pt was seen for AEX.  Reprots she continued to have PMP bleeding.  Repeat ultrasound done 05/16/14 showing uterus to now measure 20 x 19 with the continues 10 and 8cm fibroids.  Aygestin continued.  Pt reports if she misses  any aygestin, she will bleed so is finding this very frustrating.  Pt was referred to IR whose note I cannot see today.  I can see there was an appt but not the note.  Pt reports she was told IR was reluctant to perform [place] due to concerns about pt's post procedure pain tolerance.    After reviewed all of the above with pt, very frankly D/W pt appropriateness of hysterectomy discussion.  Due to change in fibroids and uterine size in PMP female, sarcoma must be ruled out so hysterectomy is best and most appropriate option.  TAH discussed.  Absolutely would not perform any hysterectomy with morcellation.  Pt states she wants laparoscopic if possible and I advised her it is not possible.  Would need to be TAH and even possibly midline incision.  Pt states she is ready to proceed but "no disrespect to you, I want the best".  Requests referral to two additional local female gynecologists.  Declines referral to [facility] today.  Pt absolutely knows I feel she needs to proceed with getting this scheduled and done.  Review of Systems  Gastrointestinal: Positive for abdominal distention (mild due to uterine size). Negative for constipation.  Genitourinary: Positive for vaginal bleeding (if misses aygestin pills). Negative for vaginal discharge and pelvic pain.       Objective:   Physical Exam  Constitutional: She is oriented to person, place, and time. She appears well-developed  and well-nourished.  Abdominal: Soft. Bowel sounds are normal. She exhibits mass (just underneath umbilicus, fills lower abdomen/pelvis). She exhibits no distension.  Neurological: She is alert and oriented to person, place, and time.  Skin: Skin is warm and dry.       Assessment:     Grossly enlarged uterus with multiple fibroids, largest 10cm and 8cm PMP bleeding with endometrial biopsy and hysteroscopy with partial fibroid resection.  Pathology for both were negative.    Plan:     Referral appointments made for pt at her  request.  One appt given to her today.  The other was not made but outside office states they will call her directly with appt.  Rx for aygestin 10mg  bid given for pt to use until surgery is completed.  Pt understands importance of proceeding.  ~30 minutes spent with patient >50% of time was in face to face discussion of above.

## 2014-08-21 ENCOUNTER — Other Ambulatory Visit: Payer: Self-pay | Admitting: Obstetrics & Gynecology

## 2014-08-21 NOTE — Telephone Encounter (Signed)
Last AEX 04/26/14 Last refill 07/25/14 #60/0Refills  Please advise

## 2014-08-21 NOTE — Telephone Encounter (Signed)
Pt calling for refill on Norethindrone [facility] at [phone].

## 2014-08-22 NOTE — Telephone Encounter (Signed)
Call to patient, LMTCB

## 2014-08-22 NOTE — Telephone Encounter (Signed)
Patient returned call. Advised calling for update on patients plan for surgery. She states she has seen Dr Leo Grosser who is still reviewing her records and is going to call her back with her recommendations. Thinks she may offer an ablation. She is still waiting to find out if Dr Leo Grosser even accepts her as a patient. She has received a call from [facility] but she didn't think she was interested in scheduling there. Preferred a local option if possible but hasn't completely rule this out. Never heard back from Dr Cousin's office. (they were full at time of referral, were to review records and call her if they could get her in sooner than first of the year). Patient states she came to see Dr Sabra Heck for medical management until decision could be made regarding plan for surgery which she has not yet had enough time to make. Wants Dr Sabra Heck to continue medical management for now. Advised that we needed updated, will provide update to Dr Sabra Heck for review. Will also try back to Dr Cousin's office regarding appointment.  Patient has four pills left and takes BID to manage bleeding.

## 2014-08-22 NOTE — Telephone Encounter (Signed)
Gay Filler may need to call pt.  This is pt with large uterus who I saw at beginning of month.  I really feel she needs to proceed with hysterectomy.  She was given two separate appts for second opinions.  I think if she has seen one of them and is making plans with another MD, that person really needs to manage this medication.    My recommendation is hysterectomy.  Gay Filler, can you call and see where she is with her plans?

## 2014-08-23 MED ORDER — NORETHINDRONE ACETATE 5 MG PO TABS: 10.0000 mg | ORAL_TABLET | Freq: Every day | ORAL | Status: DC

## 2014-08-24 ENCOUNTER — Telehealth: Payer: Self-pay | Admitting: Obstetrics & Gynecology

## 2014-08-24 NOTE — Telephone Encounter (Signed)
I tried contacting the patient today to advise that the records are at Dr Garwin Brothers office and being reviewed. There was no answer. Left message for patient to call back.

## 2014-08-28 ENCOUNTER — Encounter (HOSPITAL_COMMUNITY): Payer: Self-pay | Admitting: *Deleted

## 2014-08-28 ENCOUNTER — Other Ambulatory Visit: Payer: Self-pay | Admitting: Obstetrics and Gynecology

## 2014-08-31 ENCOUNTER — Ambulatory Visit (HOSPITAL_COMMUNITY): Payer: 59 | Admitting: Certified Registered Nurse Anesthetist

## 2014-08-31 ENCOUNTER — Ambulatory Visit (HOSPITAL_COMMUNITY)
Admission: RE | Admit: 2014-08-31 | Discharge: 2014-08-31 | Disposition: A | Discharge status: Home / Self Care | Payer: 59 | Source: Ambulatory Visit | Attending: Obstetrics and Gynecology | Admitting: Obstetrics and Gynecology

## 2014-08-31 ENCOUNTER — Encounter (HOSPITAL_COMMUNITY): Payer: Self-pay | Admitting: Obstetrics and Gynecology

## 2014-08-31 ENCOUNTER — Encounter (HOSPITAL_COMMUNITY)
Admission: RE | Disposition: A | Discharge status: Home / Self Care | Payer: Self-pay | Source: Ambulatory Visit | Attending: Obstetrics and Gynecology

## 2014-08-31 DIAGNOSIS — F419 Anxiety disorder, unspecified: Secondary | ICD-10-CM | POA: Insufficient documentation

## 2014-08-31 DIAGNOSIS — K219 Gastro-esophageal reflux disease without esophagitis: Secondary | ICD-10-CM | POA: Insufficient documentation

## 2014-08-31 DIAGNOSIS — N950 Postmenopausal bleeding: Secondary | ICD-10-CM | POA: Diagnosis present

## 2014-08-31 DIAGNOSIS — Z539 Procedure and treatment not carried out, unspecified reason: Secondary | ICD-10-CM | POA: Diagnosis not present

## 2014-08-31 DIAGNOSIS — R002 Palpitations: Secondary | ICD-10-CM | POA: Diagnosis present

## 2014-08-31 DIAGNOSIS — D219 Benign neoplasm of connective and other soft tissue, unspecified: Secondary | ICD-10-CM | POA: Diagnosis present

## 2014-08-31 DIAGNOSIS — D259 Leiomyoma of uterus, unspecified: Secondary | ICD-10-CM | POA: Diagnosis not present

## 2014-08-31 DIAGNOSIS — Z79899 Other long term (current) drug therapy: Secondary | ICD-10-CM | POA: Diagnosis not present

## 2014-08-31 DIAGNOSIS — J45909 Unspecified asthma, uncomplicated: Secondary | ICD-10-CM | POA: Insufficient documentation

## 2014-08-31 LAB — CBC
HCT: 39.2 % (ref 36.0–46.0)
Hemoglobin: 13.2 g/dL (ref 12.0–15.0)
MCH: 27.2 pg (ref 26.0–34.0)
MCHC: 33.7 g/dL (ref 30.0–36.0)
MCV: 80.7 fL (ref 78.0–100.0)
Platelets: 305 10*3/uL (ref 150–400)
RBC: 4.86 MIL/uL (ref 3.87–5.11)
RDW: 14.3 % (ref 11.5–15.5)
WBC: 9.8 10*3/uL (ref 4.0–10.5)

## 2014-08-31 SURGERY — DILATATION & CURETTAGE/HYSTEROSCOPY WITH THERMACHOICE ABLATION
Anesthesia: Choice

## 2014-08-31 MED ORDER — ACETAMINOPHEN 160 MG/5ML PO SOLN
950.0000 mg | Freq: Once | ORAL | Status: AC
  Administered 2014-08-31: 950 mg via ORAL

## 2014-08-31 MED ORDER — SCOPOLAMINE 1 MG/3DAYS TD PT72
1.0000 | MEDICATED_PATCH | Freq: Once | TRANSDERMAL | Status: DC
  Administered 2014-08-31: 1.5 mg via TRANSDERMAL

## 2014-08-31 MED ORDER — LACTATED RINGERS IV SOLN
INTRAVENOUS | Status: DC
  Administered 2014-08-31: 11:00:00 via INTRAVENOUS

## 2014-08-31 MED ORDER — SILVER NITRATE-POT NITRATE 75-25 % EX MISC
CUTANEOUS | Status: AC
  Filled 2014-08-31: qty 1

## 2014-08-31 MED ORDER — SCOPOLAMINE 1 MG/3DAYS TD PT72
MEDICATED_PATCH | TRANSDERMAL | Status: AC
  Filled 2014-08-31: qty 1

## 2014-08-31 MED ORDER — ONDANSETRON HCL 4 MG/2ML IJ SOLN
INTRAMUSCULAR | Status: AC
  Filled 2014-08-31: qty 2

## 2014-08-31 MED ORDER — LIDOCAINE HCL 2 % IJ SOLN
INTRAMUSCULAR | Status: AC
  Filled 2014-08-31: qty 20

## 2014-08-31 MED ORDER — FENTANYL CITRATE 0.05 MG/ML IJ SOLN
INTRAMUSCULAR | Status: AC
  Filled 2014-08-31: qty 2

## 2014-08-31 MED ORDER — MIDAZOLAM HCL 2 MG/2ML IJ SOLN
INTRAMUSCULAR | Status: AC
  Filled 2014-08-31: qty 2

## 2014-08-31 MED ORDER — PROPOFOL 10 MG/ML IV EMUL
INTRAVENOUS | Status: AC
  Filled 2014-08-31: qty 20

## 2014-08-31 MED ORDER — ACETAMINOPHEN 160 MG/5ML PO SOLN
ORAL | Status: AC
  Administered 2014-08-31: 950 mg via ORAL
  Filled 2014-08-31: qty 40.6

## 2014-08-31 MED ORDER — LIDOCAINE HCL (CARDIAC) 20 MG/ML IV SOLN
INTRAVENOUS | Status: AC
  Filled 2014-08-31: qty 5

## 2014-08-31 MED ORDER — DEXAMETHASONE SODIUM PHOSPHATE 10 MG/ML IJ SOLN
INTRAMUSCULAR | Status: AC
  Filled 2014-08-31: qty 1

## 2014-08-31 SURGICAL SUPPLY — 14 items
BOOTIES KNEE HIGH SLOAN (MISCELLANEOUS) IMPLANT
CANISTER SUCT 3000ML (MISCELLANEOUS) IMPLANT
CATH ROBINSON RED A/P 16FR (CATHETERS) IMPLANT
CATH THERMACHOICE III (CATHETERS) IMPLANT
CLOTH BEACON ORANGE TIMEOUT ST (SAFETY) IMPLANT
CONTAINER PREFILL 10% NBF 60ML (FORM) IMPLANT
DRAPE WARM FLUID 44X44 (DRAPE) IMPLANT
GAUZE VASELINE 3X9 (GAUZE/BANDAGES/DRESSINGS) IMPLANT
GLOVE SURG SS PI 6.5 STRL IVOR (GLOVE) IMPLANT
GOWN STRL REUS W/TWL LRG LVL3 (GOWN DISPOSABLE) IMPLANT
PACK VAGINAL MINOR WOMEN LF (CUSTOM PROCEDURE TRAY) IMPLANT
PAD OB MATERNITY 4.3X12.25 (PERSONAL CARE ITEMS) IMPLANT
TOWEL OR 17X24 6PK STRL BLUE (TOWEL DISPOSABLE) IMPLANT
WATER STERILE IRR 1000ML POUR (IV SOLUTION) IMPLANT

## 2014-08-31 NOTE — H&P (Signed)
Amy Phillips is an 58 y.o. female who presents for management of postmenopausal bleeding which began over a year ago. She has large uterine fibroids, but declines hysterectomy as definitive therapy. She has undergone several benign endometrial biopsies, the first of which seem to marked the beginning of vaginal bleeding that has persisted since October 2014. She underwent a hysteroscopy and partial resection of fibroids on 09/08/2013, all revealing benign tissue, but without any improvement in her bleeding status.  She wants to pursue the possibility of endometrial ablation as a minimally invasive option to allow discontinuation of her bleeding..  Pertinent Gynecological History: Menses: post-menopausal Bleeding: post menopausal bleeding  DES exposure: denies Blood transfusions: none Sexually transmitted diseases: no past history Previous GYN Procedures: DNC and hysteroscopy  Last mammogram: normal  Last pap: normal Date:  OB History:    Menstrual History: Menarche age: 66  No LMP recorded (lmp unknown). Patient is postmenopausal.    Past Medical History  Diagnosis Date  . Allergy   . Asthma   . Anxiety   . GERD (gastroesophageal reflux disease)   . Uterine fibroid 2006    16 week size  . Diverticulitis 08/05/2012    Dr Collene Mares  . 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 Dilation and curettage of uterus      D&E x2 missed ab  . Dilatation & curettage/hysteroscopy with trueclear N/A 09/08/2013    Procedure: DILATATION & CURETTAGE/HYSTEROSCOPY WITH TRUCLEAR;  Surgeon: Azalia Bilis, MD;  Location: [facility];  Service: Gynecology;  Laterality: N/A;  . Upper gi endoscopy  07/23/12    grade 1 esophagitis noted:  otherwise normal esophagogastrduodenoscopy    Family History  Problem Relation Age of Onset  . Arthritis Mother   . Osteoporosis Mother   . Breast cancer Mother   . Cancer Mother     lung  . COPD Mother   . Cancer Father     lung  . COPD Father   . Heart disease Father   . Hypertension Sister   . Heart disease Sister   . Arthritis Sister   . Hypertension Brother     Social History:  reports that she has never smoked. She does not have any smokeless tobacco history on file. She reports that she does not drink alcohol or use illicit drugs.  Allergies: No Known Allergies  Prescriptions prior to admission  Medication Sig Dispense Refill Last Dose  . acetaminophen (TYLENOL) 500 MG tablet Take 1,000 mg by mouth every 6 (six) hours as needed for mild pain.   08/30/2014 at Unknown time  . cetirizine (ZYRTEC) 10 MG tablet Take 10 mg by mouth daily as needed for allergies.   08/30/2014 at Unknown time  . ibuprofen (ADVIL,MOTRIN) 200 MG tablet Take 800 mg by mouth once.   08/30/2014 at Unknown time  . misoprostol (CYTOTEC) 200 MCG tablet Place 200 mcg vaginally 4 (four) times daily.   08/31/2014 at Unknown time  . norethindrone (AYGESTIN) 5 MG tablet Take 2 tablets (10 mg total) by mouth daily. 60 tablet 1 08/30/2014 at Unknown time  . valACYclovir (VALTREX) 1000 MG tablet Take 1,000 mg by mouth daily as needed. Take 2000 mg on day   unknown  . ALPRAZolam (XANAX) 0.25 MG tablet TAKE 1 TABLET BY MOUTH AT BEDTIME AS NEEDED (Patient taking differently: Take 0.25 mg by mouth 2 (two) times daily as needed for anxiety. ) 30 tablet 2  Unknown at Unknown time  . fluticasone (FLONASE) 50 MCG/ACT nasal spray Place 2 sprays into both nostrils as needed for allergies. 16 g 5 08/28/14  . predniSONE (DELTASONE) 10 MG tablet 6-5-4-3-2-1 tab po qd taper x 6d (Patient not taking: Reported on 08/31/2014) 21 tablet 0 Unknown at Unknown time  . QVAR 80 MCG/ACT inhaler Inhale 2 puff into the lungs bid as directed. 8.7 g 5 08/27/14  . valACYclovir (VALTREX) 1000 MG tablet TAKE 2 TABLETS BY MOUTH TWICE A DAY FOR 1 DAY, START AT 1ST SIGN OF ONSET (Patient not taking: Reported on 08/31/2014) 30 tablet 1 Unknown at Unknown time  . VENTOLIN HFA 108 (90  BASE) MCG/ACT inhaler Inhale 2 puffs every 4-6 hrs as needed. 18 g 11 08/27/14    Review of Systems  Constitutional: Negative.   HENT: Negative.   Eyes: Negative.   Respiratory: Positive for wheezing.        Uses inhaler for asthma prn  Cardiovascular: Positive for palpitations.       Occassional pvc's with anxiety and fatigue  Gastrointestinal: Positive for heartburn.       Worse with enlarging uterus  Genitourinary: Negative.        Almost daily vaginal bleeding  Musculoskeletal: Negative.   Skin: Negative.   Neurological: Negative.   Endo/Heme/Allergies: Negative.   Psychiatric/Behavioral: The patient is nervous/anxious.        Associated with prolonged vaginal bleeding    Pulse 83, temperature 98.4 F (36.9 C), temperature source Oral, resp. rate 18, height 5\' 3"  (1.6 m), weight 128 lb (58.06 kg), SpO2 99 %. Physical Exam  Constitutional: She is oriented to person, place, and time. She appears well-developed and well-nourished.  HENT:  Head: Normocephalic and atraumatic.  Eyes: Conjunctivae and EOM are normal.  Neck: Normal range of motion. Neck supple.  Cardiovascular: Normal rate and regular rhythm.   Respiratory: Effort normal and breath sounds normal.  GI: Soft. Bowel sounds are normal. She exhibits mass.  Suprapubic mass to umbilicus, nontender  Genitourinary: Vagina normal and uterus normal.  Musculoskeletal: Normal range of motion.  Neurological: She is alert and oriented to person, place, and time.  Skin: Skin is warm and dry.  Psychiatric: She has a normal mood and affect.    Results for orders placed or performed during the hospital encounter of 08/31/14 (from the past 24 hour(s))  CBC     Status: None   Collection Time: 08/31/14 10:24 AM  Result Value Ref Range   WBC 9.8 4.0 - 10.5 K/uL   RBC 4.86 3.87 - 5.11 MIL/uL   Hemoglobin 13.2 12.0 - 15.0 g/dL   HCT 39.2 36.0 - 46.0 %   MCV 80.7 78.0 - 100.0 fL   MCH 27.2 26.0 - 34.0 pg   MCHC 33.7 30.0 - 36.0  g/dL   RDW 14.3 11.5 - 15.5 %   Platelets 305 150 - 400 K/uL    No results found.  Assessment/Plan: After a careful review of the patients MRI, I have discussed with her that an endometrial ablation may indeed be technically possible. I reviewed that this procedure Is technically indicated for women who have no anatomic abnormality as the cause of their abnormal uterine bleeding. Since she has large fibroids, she may not be among the 85% of women who typically have an improvement in their bleeding. She also may not achieve amenorrhea. She is not interested in hysterectomy unless she has no other choice. She would not accept uterine morcellation as  part of a minimally invasive procedure, so any hysterectomy would need to be an open procedure for her. . Usually within three months she will demonstrate the degree of relief that she can expect from this procedure. She feels that if she has not gotten relief from her bleeding by that time she May decide on hysterectomy. The patient acknowledges that all her questions have been answered and she will await my follow-up call. Today we learned that the lot number of the  endometrial ablation catheter which I consider the best option for this patient has undergone a voluntary recall from the [facility].  The Health system may  have this important option available for patients in the future  since there is no FDA regulation that precludes its use.  I have reviewed this with the patient and she hopes to proceed with Thermachoice endometrial ablation when it is available.  Today her surgery will be cancelled.          Memphis Creswell P 08/31/2014, 12:19 PM

## 2014-08-31 NOTE — Anesthesia Preprocedure Evaluation (Signed)
Anesthesia Evaluation    Airway Mallampati: II  TM Distance: >3 FB Neck ROM: Full    Dental no notable dental hx. (+) Teeth Intact   Pulmonary shortness of breath and with exertion, asthma ,  breath sounds clear to auscultation  Pulmonary exam normal       Cardiovascular negative cardio ROS  Rhythm:Regular Rate:Normal     Neuro/Psych Anxiety negative neurological ROS     GI/Hepatic Neg liver ROS, GERD-  Medicated and Controlled,Diverticulosis   Endo/Other  Hyperlipidemia  Renal/GU negative Renal ROS  negative genitourinary   Musculoskeletal negative musculoskeletal ROS (+)   Abdominal   Peds  Hematology negative hematology ROS (+)   Anesthesia Other Findings   Reproductive/Obstetrics Submucosal Fibroid PMB HSV                             Anesthesia Physical  Anesthesia Plan  ASA: II  Anesthesia Plan: General   Post-op Pain Management:    Induction: Intravenous  Airway Management Planned: LMA  Additional Equipment:   Intra-op Plan:   Post-operative Plan: Extubation in OR  Informed Consent: I have reviewed the patients History and Physical, chart, labs and discussed the procedure including the risks, benefits and alternatives for the proposed anesthesia with the patient or authorized representative who has indicated his/her understanding and acceptance.   Dental advisory given  Plan Discussed with: Anesthesiologist, CRNA and Surgeon  Anesthesia Plan Comments:         Anesthesia Quick Evaluation

## 2014-08-31 NOTE — OR Nursing (Signed)
Case cancelled per Dr. Leo Grosser, patient never entered OR room.

## 2014-09-05 NOTE — Telephone Encounter (Signed)
Follow up call to patient, left message to call back. 

## 2014-09-13 NOTE — Telephone Encounter (Signed)
OK to close encounter.  No follow up needed.  Has established care elsewhere.

## 2014-09-13 NOTE — Telephone Encounter (Signed)
Patient has not returned call. According to [facility] appointment desk, Hysteroscopy/D&C with ThermaChoice ablation was done by Dr Leo Grosser on 08-31-14.  Any further follow-up needed?

## 2014-10-05 ENCOUNTER — Encounter (HOSPITAL_COMMUNITY): Payer: Self-pay | Admitting: Obstetrics and Gynecology

## 2014-11-01 ENCOUNTER — Other Ambulatory Visit: Payer: Self-pay

## 2014-11-01 MED ORDER — VALACYCLOVIR HCL 1 G PO TABS: ORAL_TABLET | ORAL | Status: DC

## 2014-11-01 NOTE — Telephone Encounter (Signed)
Meds ordered this encounter  Medications  . valACYclovir (VALTREX) 1000 MG tablet    Sig: Take 2 tablets by mouth twice daily for 1 day. Start at first sign of Onset.    Dispense:  30 tablet    Refill:  0

## 2014-11-01 NOTE — Telephone Encounter (Signed)
Pharm sent req for RFs of Valtrex. Pt has received RF of this once in [facility], but no notes as to when/why orig Rxd. Pulled paper chart and have put in Chelle's box for review. Notes on Rx and Dx on 08/15/11 OV notes. Please advise.

## 2014-11-22 LAB — HM MAMMOGRAPHY

## 2014-12-29 DIAGNOSIS — N939 Abnormal uterine and vaginal bleeding, unspecified: Secondary | ICD-10-CM | POA: Insufficient documentation

## 2014-12-29 DIAGNOSIS — D259 Leiomyoma of uterus, unspecified: Secondary | ICD-10-CM | POA: Insufficient documentation

## 2015-02-06 ENCOUNTER — Encounter: Payer: Self-pay | Admitting: *Deleted

## 2015-02-06 DIAGNOSIS — D259 Leiomyoma of uterus, unspecified: Secondary | ICD-10-CM

## 2015-02-06 DIAGNOSIS — N939 Abnormal uterine and vaginal bleeding, unspecified: Secondary | ICD-10-CM

## 2015-03-01 ENCOUNTER — Encounter: Payer: Self-pay | Admitting: Urgent Care

## 2015-03-01 ENCOUNTER — Ambulatory Visit (INDEPENDENT_AMBULATORY_CARE_PROVIDER_SITE_OTHER): Payer: 59 | Admitting: Urgent Care

## 2015-03-01 VITALS — BP 150/82 | HR 84 | Temp 98.5°F | Resp 16 | Ht 62.75 in | Wt 127.8 lb

## 2015-03-01 DIAGNOSIS — R0982 Postnasal drip: Secondary | ICD-10-CM | POA: Diagnosis not present

## 2015-03-01 DIAGNOSIS — R05 Cough: Secondary | ICD-10-CM | POA: Diagnosis not present

## 2015-03-01 DIAGNOSIS — J209 Acute bronchitis, unspecified: Secondary | ICD-10-CM

## 2015-03-01 DIAGNOSIS — R059 Cough, unspecified: Secondary | ICD-10-CM

## 2015-03-01 DIAGNOSIS — J4540 Moderate persistent asthma, uncomplicated: Secondary | ICD-10-CM

## 2015-03-01 DIAGNOSIS — R030 Elevated blood-pressure reading, without diagnosis of hypertension: Secondary | ICD-10-CM | POA: Diagnosis not present

## 2015-03-01 DIAGNOSIS — F419 Anxiety disorder, unspecified: Secondary | ICD-10-CM | POA: Diagnosis not present

## 2015-03-01 DIAGNOSIS — R0981 Nasal congestion: Secondary | ICD-10-CM

## 2015-03-01 MED ORDER — VENTOLIN HFA 108 (90 BASE) MCG/ACT IN AERS: INHALATION_SPRAY | RESPIRATORY_TRACT | Status: DC

## 2015-03-01 MED ORDER — AZITHROMYCIN 500 MG PO TABS: 500.0000 mg | ORAL_TABLET | Freq: Every day | ORAL | Status: DC

## 2015-03-01 MED ORDER — QVAR 80 MCG/ACT IN AERS: INHALATION_SPRAY | RESPIRATORY_TRACT | Status: DC

## 2015-03-01 MED ORDER — PREDNISONE 20 MG PO TABS: ORAL_TABLET | ORAL | Status: DC

## 2015-03-01 MED ORDER — ALPRAZOLAM 0.25 MG PO TABS: 0.2500 mg | ORAL_TABLET | Freq: Every evening | ORAL | Status: DC | PRN

## 2015-03-01 NOTE — Patient Instructions (Signed)
Acute Bronchitis Bronchitis is inflammation of the airways that extend from the windpipe into the lungs (bronchi). The inflammation often causes mucus to develop. This leads to a cough, which is the most common symptom of bronchitis.  In acute bronchitis, the condition usually develops suddenly and goes away over time, usually in a couple weeks. Smoking, allergies, and asthma can make bronchitis worse. Repeated episodes of bronchitis may cause further lung problems.  CAUSES Acute bronchitis is most often caused by the same virus that causes a cold. The virus can spread from person to person (contagious) through coughing, sneezing, and touching contaminated objects. SIGNS AND SYMPTOMS   Cough.   Fever.   Coughing up mucus.   Body aches.   Chest congestion.   Chills.   Shortness of breath.   Sore throat.  DIAGNOSIS  Acute bronchitis is usually diagnosed through a physical exam. Your health care provider will also ask you questions about your medical history. Tests, such as chest X-rays, are sometimes done to rule out other conditions.  TREATMENT  Acute bronchitis usually goes away in a couple weeks. Oftentimes, no medical treatment is necessary. Medicines are sometimes given for relief of fever or cough. Antibiotic medicines are usually not needed but may be prescribed in certain situations. In some cases, an inhaler may be recommended to help reduce shortness of breath and control the cough. A cool mist vaporizer may also be used to help thin bronchial secretions and make it easier to clear the chest.  HOME CARE INSTRUCTIONS  Get plenty of rest.   Drink enough fluids to keep your urine clear or pale yellow (unless you have a medical condition that requires fluid restriction). Increasing fluids may help thin your respiratory secretions (sputum) and reduce chest congestion, and it will prevent dehydration.   Take medicines only as directed by your health care provider.  If  you were prescribed an antibiotic medicine, finish it all even if you start to feel better.  Avoid smoking and secondhand smoke. Exposure to cigarette smoke or irritating chemicals will make bronchitis worse. If you are a smoker, consider using nicotine gum or skin patches to help control withdrawal symptoms. Quitting smoking will help your lungs heal faster.   Reduce the chances of another bout of acute bronchitis by washing your hands frequently, avoiding people with cold symptoms, and trying not to touch your hands to your mouth, nose, or eyes.   Keep all follow-up visits as directed by your health care provider.  SEEK MEDICAL CARE IF: Your symptoms do not improve after 1 week of treatment.  SEEK IMMEDIATE MEDICAL CARE IF:  You develop an increased fever or chills.   You have chest pain.   You have severe shortness of breath.  You have bloody sputum.   You develop dehydration.  You faint or repeatedly feel like you are going to pass out.  You develop repeated vomiting.  You develop a severe headache. MAKE SURE YOU:   Understand these instructions.  Will watch your condition.  Will get help right away if you are not doing well or get worse. Document Released: 10/16/2004 Document Revised: 01/23/2014 Document Reviewed: 03/01/2013 [facility] Patient Information 2015 [place], [place]. This information is not intended to replace advice given to you by your health care provider. Make sure you discuss any questions you have with your health care provider.    Asthma Asthma is a recurring condition in which the airways tighten and narrow. Asthma can make it difficult to breathe. It  can cause coughing, wheezing, and shortness of breath. Asthma episodes, also called asthma attacks, range from minor to life-threatening. Asthma cannot be cured, but medicines and lifestyle changes can help control it. CAUSES Asthma is believed to be caused by inherited (genetic) and environmental  factors, but its exact cause is unknown. Asthma may be triggered by allergens, lung infections, or irritants in the air. Asthma triggers are different for each person. Common triggers include:   Animal dander.  Dust mites.  Cockroaches.  Pollen from trees or grass.  Mold.  Smoke.  Air pollutants such as dust, household cleaners, hair sprays, aerosol sprays, paint fumes, strong chemicals, or strong odors.  Cold air, weather changes, and winds (which increase molds and pollens in the air).  Strong emotional expressions such as crying or laughing hard.  Stress.  Certain medicines (such as aspirin) or types of drugs (such as beta-blockers).  Sulfites in foods and drinks. Foods and drinks that may contain sulfites include dried fruit, potato chips, and sparkling grape juice.  Infections or inflammatory conditions such as the flu, a cold, or an inflammation of the nasal membranes (rhinitis).  Gastroesophageal reflux disease (GERD).  Exercise or strenuous activity. SYMPTOMS Symptoms may occur immediately after asthma is triggered or many hours later. Symptoms include:  Wheezing.  Excessive nighttime or early morning coughing.  Frequent or severe coughing with a common cold.  Chest tightness.  Shortness of breath. DIAGNOSIS  The diagnosis of asthma is made by a review of your medical history and a physical exam. Tests may also be performed. These may include:  Lung function studies. These tests show how much air you breathe in and out.  Allergy tests.  Imaging tests such as X-rays. TREATMENT  Asthma cannot be cured, but it can usually be controlled. Treatment involves identifying and avoiding your asthma triggers. It also involves medicines. There are 2 classes of medicine used for asthma treatment:   Controller medicines. These prevent asthma symptoms from occurring. They are usually taken every day.  Reliever or rescue medicines. These quickly relieve asthma  symptoms. They are used as needed and provide short-term relief. Your health care provider will help you create an asthma action plan. An asthma action plan is a written plan for managing and treating your asthma attacks. It includes a list of your asthma triggers and how they may be avoided. It also includes information on when medicines should be taken and when their dosage should be changed. An action plan may also involve the use of a device called a peak flow meter. A peak flow meter measures how well the lungs are working. It helps you monitor your condition. HOME CARE INSTRUCTIONS   Take medicines only as directed by your health care provider. Speak with your health care provider if you have questions about how or when to take the medicines.  Use a peak flow meter as directed by your health care provider. Record and keep track of readings.  Understand and use the action plan to help minimize or stop an asthma attack without needing to seek medical care.  Control your home environment in the following ways to help prevent asthma attacks:  Do not smoke. Avoid being exposed to secondhand smoke.  Change your heating and air conditioning filter regularly.  Limit your use of fireplaces and wood stoves.  Get rid of pests (such as roaches and mice) and their droppings.  Throw away plants if you see mold on them.  Clean your floors and dust  regularly. Use unscented cleaning products.  Try to have someone else vacuum for you regularly. Stay out of rooms while they are being vacuumed and for a short while afterward. If you vacuum, use a dust mask from a hardware store, a double-layered or microfilter vacuum cleaner bag, or a vacuum cleaner with a HEPA filter.  Replace carpet with wood, tile, or vinyl flooring. Carpet can trap dander and dust.  Use allergy-proof pillows, mattress covers, and box spring covers.  Wash bed sheets and blankets every week in hot water and dry them in a  dryer.  Use blankets that are made of polyester or cotton.  Clean bathrooms and kitchens with bleach. If possible, have someone repaint the walls in these rooms with mold-resistant paint. Keep out of the rooms that are being cleaned and painted.  Wash hands frequently. SEEK MEDICAL CARE IF:   You have wheezing, shortness of breath, or a cough even if taking medicine to prevent attacks.  The colored mucus you cough up (sputum) is thicker than usual.  Your sputum changes from clear or white to yellow, green, gray, or bloody.  You have any problems that may be related to the medicines you are taking (such as a rash, itching, swelling, or trouble breathing).  You are using a reliever medicine more than 2-3 times per week.  Your peak flow is still at 50-79% of your personal best after following your action plan for 1 hour.  You have a fever. SEEK IMMEDIATE MEDICAL CARE IF:   You seem to be getting worse and are unresponsive to treatment during an asthma attack.  You are short of breath even at rest.  You get short of breath when doing very little physical activity.  You have difficulty eating, drinking, or talking due to asthma symptoms.  You develop chest pain.  You develop a fast heartbeat.  You have a bluish color to your lips or fingernails.  You are light-headed, dizzy, or faint.  Your peak flow is less than 50% of your personal best. MAKE SURE YOU:   Understand these instructions.  Will watch your condition.  Will get help right away if you are not doing well or get worse. Document Released: 09/08/2005 Document Revised: 01/23/2014 Document Reviewed: 04/07/2013 [facility] Patient Information 2015 [place], [place]. This information is not intended to replace advice given to you by your health care provider. Make sure you discuss any questions you have with your health care provider.

## 2015-03-01 NOTE — Progress Notes (Signed)
    MRN: [id number] DOB: 07-13-1956  Subjective:   Amy Phillips is a 59 y.o. female presenting for chief complaint of Shortness of Breath and Cough  Reports ~4 week history of cough, chest tightness, shob, tickle in her throat, sinus pressure and congestion, ear popping. Cough is mostly dry, occurs randomly throughout the day and not worse at night, no hemoptysis. Cough and chest tightness, shortness of breath have been worse in the last week. She has been using her albuterol inhaler daily every 2-4 hours with minimal to some relief, also using Sudafed. 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 Denies fevers, itchy or watery red eyes, ear pain, ear drainage, tooth pain, throat pain, chest pain, nausea, vomiting, abdominal pain, diarrhea, myalgia. Patient admits history of bad seasonal allergies, takes Flonase daily for this. Denies any other aggravating or relieving factors, no other questions or concerns.  Amy Phillips has a current medication list which includes the following prescription(s): acetaminophen, alprazolam, fluticasone, leuprolide, norethindrone, qvar, valacyclovir, and ventolin hfa. She has No Known Allergies.  Amy Phillips  has a past medical history of Allergy; Asthma; Anxiety; GERD (gastroesophageal reflux disease); Uterine fibroid (2006); Diverticulitis (08/05/2012); and Arrhythmia. Also  has past surgical history that includes Dilation and curettage of uterus; Dilatation & curettage/hysteroscopy with trueclear (N/A, 09/08/2013); and Upper gi endoscopy (07/23/12).  ROS As in subjective.  Objective:   Vitals: BP 150/82 mmHg  Pulse 84  Temp(Src) 98.5 F (36.9 C) (Oral)  Resp 16  Ht 5' 2.75" (1.594 m)  Wt 127 lb 12.8 oz (57.97 kg)  BMI 22.82 kg/m2  SpO2 99%  LMP  (LMP Unknown)  Physical Exam  Constitutional: She appears well-developed and well-nourished.  HENT:  TM's flat bilaterally, no effusions or erythema. Nasal turbinates  erythematous L>R without edema or abscesses. No sinus tenderness. Postnasal drip present, without oropharyngeal exudates, erythema or abscesses.  Eyes: Conjunctivae and EOM are normal. Right eye exhibits no discharge. Left eye exhibits no discharge. No scleral icterus.  Neck: Normal range of motion. Neck supple.  Cardiovascular: Normal rate, regular rhythm and intact distal pulses.  Exam reveals no gallop and no friction rub.   No murmur heard. Pulmonary/Chest: No stridor. No respiratory distress. She has no wheezes. She has no rales.  Lymphadenopathy:    She has no cervical adenopathy.  Skin: Skin is warm and dry. No rash noted. No erythema. No pallor.   Assessment and Plan :   1. Acute bronchitis, unspecified organism 2. Asthma, moderate persistent, uncomplicated 3. Cough 4. Nasal congestion 5. PND (post-nasal drip) - Will cover for infectious process with azithromycin, start short steroid course to help with asthma. Counseled patient on albuterol use, recommended that she decrease this to every 4-6 hours. Recommended she stop using Sudafed given her elevated blood pressure. Suggested she try an oral antihistamine instead, nasal saline spray, continue Flonase.  6. Anxiety - Provided refill for patient's Xanax, she may take half to one tablet every night for anxiety and sleep.  7. Elevated blood pressure without diagnosis of hypertension - She reports consistent white coat syndrome, counseled on Sudafed and albuterol use as possible factors, will recheck blood pressure at next visit, consider starting blood pressure medication.  Jaynee Eagles, PA-C [facility] [phone] 03/01/2015 2:12 PM

## 2015-03-05 ENCOUNTER — Encounter: Payer: Self-pay | Admitting: *Deleted

## 2015-04-22 ENCOUNTER — Encounter: Payer: Self-pay | Admitting: Cardiology

## 2015-05-01 ENCOUNTER — Ambulatory Visit: Payer: 59 | Admitting: Gynecology

## 2015-05-25 ENCOUNTER — Other Ambulatory Visit: Payer: Self-pay | Admitting: Obstetrics and Gynecology

## 2015-06-01 ENCOUNTER — Ambulatory Visit (INDEPENDENT_AMBULATORY_CARE_PROVIDER_SITE_OTHER): Payer: 59 | Admitting: Emergency Medicine

## 2015-06-01 ENCOUNTER — Other Ambulatory Visit: Payer: Self-pay | Admitting: Obstetrics and Gynecology

## 2015-06-01 VITALS — BP 126/84 | HR 84 | Temp 98.4°F | Resp 18 | Ht 62.5 in | Wt 129.2 lb

## 2015-06-01 DIAGNOSIS — T7840XA Allergy, unspecified, initial encounter: Secondary | ICD-10-CM | POA: Diagnosis not present

## 2015-06-01 DIAGNOSIS — R509 Fever, unspecified: Secondary | ICD-10-CM | POA: Diagnosis not present

## 2015-06-01 DIAGNOSIS — J4540 Moderate persistent asthma, uncomplicated: Secondary | ICD-10-CM

## 2015-06-01 LAB — POCT CBC
Granulocyte percent: 69.2 %G (ref 37–80)
HCT, POC: 43.6 % (ref 37.7–47.9)
Hemoglobin: 14 g/dL (ref 12.2–16.2)
Lymph, poc: 3.1 (ref 0.6–3.4)
MCH, POC: 25.4 pg — AB (ref 27–31.2)
MCHC: 32.1 g/dL (ref 31.8–35.4)
MCV: 79.2 fL — AB (ref 80–97)
MID (cbc): 0.6 (ref 0–0.9)
MPV: 7.9 fL (ref 0–99.8)
POC Granulocyte: 8.3 — AB (ref 2–6.9)
POC LYMPH PERCENT: 25.8 %L (ref 10–50)
POC MID %: 5 %M (ref 0–12)
Platelet Count, POC: 361 10*3/uL (ref 142–424)
RBC: 5.5 M/uL — AB (ref 4.04–5.48)
RDW, POC: 16 %
WBC: 12 10*3/uL — AB (ref 4.6–10.2)

## 2015-06-01 MED ORDER — EPINEPHRINE 0.3 MG/0.3ML IJ SOAJ: 0.3000 mg | Freq: Once | INTRAMUSCULAR | Status: DC

## 2015-06-01 MED ORDER — VENTOLIN HFA 108 (90 BASE) MCG/ACT IN AERS: INHALATION_SPRAY | RESPIRATORY_TRACT | Status: DC

## 2015-06-01 MED ORDER — PREDNISONE 10 MG PO TABS: ORAL_TABLET | ORAL | Status: DC

## 2015-06-01 NOTE — Patient Instructions (Signed)

## 2015-06-01 NOTE — Progress Notes (Addendum)
Patient ID: Amy Phillips, female   DOB: [birth year], 59 y.o.   MRN: [id number]    This chart was scribed for Amy Russian, MD by Ladene Artist, ED Scribe. The patient was seen in room 13. Patient's care was started at 2:18 PM.   Chief Complaint:  Chief Complaint  Patient presents with  . Insect Bite    Happened on Wednesday. Unknown bugs were biting her ankles. Has a large bite on lower right leg.   . Allergic Reaction    Generalized redness all over body  . Fever    Went to OB/GYN this morning, was told she was running a low grade fever of 99.59F (oral)    HPI: [name] is a 59 y.o. female who reports to [facility] today complaining of an insect bite to her right lower leg sustained 1 week ago. Pt is a pediatric nurse. She states that she was in a pt's room when she initially felt the bite. She states that she went home and ate boxed organic soup prior to her shower. When she got out of the shower she noticed generalized pruritic rash and swelling in her upper airway. Pt states that the affected area to her right lower leg has worsened and is now red and pruritic. She reports associated low grade fever of 99.9 F this morning, chills, mild arthralgias, difficulty breathing at night, intermittent rash that returns after Benadryl wears off. She denies wheezing but states that she did use her albuterol inhaler which she does not usually use. She has tried Motrin, Claritin and Benadryl every 6 hours. She reports h/o environmental allergies but no epi-pen use.  Past Medical History  Diagnosis Date  . Allergy   . Asthma   . Anxiety   . GERD (gastroesophageal reflux disease)   . Uterine fibroid 2006    16 week size  . Diverticulitis 08/05/2012    Dr Collene Mares  . 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 Dilation and curettage of uterus      D&E x2 missed ab  . Dilatation & curettage/hysteroscopy with trueclear N/A 09/08/2013    Procedure: DILATATION &  CURETTAGE/HYSTEROSCOPY WITH TRUCLEAR;  Surgeon: Azalia Bilis, MD;  Location: [facility];  Service: Gynecology;  Laterality: N/A;  . Upper gi endoscopy  07/23/12    grade 1 esophagitis noted:  otherwise normal esophagogastrduodenoscopy   Social History   Social History  . Marital Status: Married    Spouse Name: N/A  . Number of Children: N/A  . Years of Education: N/A   Social History Main Topics  . Smoking status: Never Smoker   . Smokeless tobacco: None  . Alcohol Use: No  . Drug Use: No  . Sexual Activity:    Partners: Male    Birth Control/ Protection: None   Other Topics Concern  . None   Social History Narrative   Family History  Problem Relation Age of Onset  . Arthritis Mother   . Osteoporosis Mother   . Breast cancer Mother   . Cancer Mother     lung  . COPD Mother   . Cancer Father     lung  . COPD Father   . Heart disease Father   . Hypertension Sister   . Heart disease Sister   . Arthritis Sister   . Hypertension Brother    No Known Allergies Prior to Admission medications   Medication Sig  Start Date End Date Taking? Authorizing Provider  acetaminophen (TYLENOL) 500 MG tablet Take 1,000 mg by mouth every 6 (six) hours as needed for mild pain.   Yes Historical Provider, MD  ALPRAZolam (XANAX) 0.25 MG tablet Take 1 tablet (0.25 mg total) by mouth at bedtime as needed for anxiety or sleep. 03/01/15  Yes Jaynee Eagles, PA-C  fluticasone (FLONASE) 50 MCG/ACT nasal spray Place 2 sprays into both nostrils as needed for allergies. 03/20/14  Yes Shawnee Knapp, MD  norethindrone (AYGESTIN) 5 MG tablet Take 2 tablets (10 mg total) by mouth daily. Patient taking differently: Take 15 mg by mouth daily.  08/23/14  Yes Megan Salon, MD  QVAR 80 MCG/ACT inhaler Inhale 2 puff into the lungs bid as directed. 03/01/15  Yes Jaynee Eagles, PA-C  VENTOLIN HFA 108 (90 BASE) MCG/ACT inhaler Inhale 2 puffs every 4-6 hrs as needed. 03/01/15  Yes Jaynee Eagles, PA-C  azithromycin (ZITHROMAX) 500 MG  tablet Take 1 tablet (500 mg total) by mouth daily. Patient not taking: Reported on 06/01/2015 03/01/15   Jaynee Eagles, PA-C  predniSONE (DELTASONE) 20 MG tablet Take 2 tablets daily with breakfast. Patient not taking: Reported on 06/01/2015 03/01/15   Jaynee Eagles, PA-C  valACYclovir (VALTREX) 1000 MG tablet Take 2 tablets by mouth twice daily for 1 day. Start at first sign of Onset. Patient not taking: Reported on 06/01/2015 11/01/14   Chelle Jeffery, PA-C     ROS: The patient denies night sweats, unintentional weight loss, chest pain, palpitations, wheezing, dyspnea on exertion, nausea, vomiting, abdominal pain, dysuria, hematuria, melena, numbness, weakness, or tingling. + fevers, + chills, + rash, + arthralgias, + SOB  All other systems have been reviewed and were otherwise negative with the exception of those mentioned in the HPI and as above.    PHYSICAL EXAM: Filed Vitals:   06/01/15 1407  BP: 126/84  Pulse: 84  Temp: 98.4 F (36.9 C)  Resp: 18   Body mass index is 23.24 kg/(m^2).   General: Alert, no acute distress HEENT:  Normocephalic, atraumatic, oropharynx patent. Eye: Juliette Mangle [facility] Cardiovascular:  Regular rate and rhythm, no rubs murmurs or gallops.  No Carotid bruits, radial pulse intact. No pedal edema.  Respiratory: Clear to auscultation bilaterally.  No wheezes, rales, or rhonchi.  No cyanosis, no use of accessory musculature Abdominal: No organomegaly, abdomen is soft and non-tender, positive bowel sounds.  No masses. Musculoskeletal: Gait intact. No edema, tenderness Skin: No hives seen. 2 small petechial areas surrounded by 1 cm macular papular rash on R shin.  Neurologic: Facial musculature symmetric. Psychiatric: Patient acts appropriately throughout our interaction. Lymphatic: No cervical or submandibular lymphadenopathy Genitourinary/Anorectal: No acute findings  LABS: Results for orders placed or performed in visit on 06/01/15  POCT CBC  Result Value Ref Range    WBC 12.0 (A) 4.6 - 10.2 K/uL   Lymph, poc 3.1 0.6 - 3.4   POC LYMPH PERCENT 25.8 10 - 50 %L   MID (cbc) 0.6 0 - 0.9   POC MID % 5.0 0 - 12 %M   POC Granulocyte 8.3 (A) 2 - 6.9   Granulocyte percent 69.2 37 - 80 %G   RBC 5.50 (A) 4.04 - 5.48 M/uL   Hemoglobin 14.0 12.2 - 16.2 g/dL   HCT, POC 43.6 37.7 - 47.9 %   MCV 79.2 (A) 80 - 97 fL   MCH, POC 25.4 (A) 27 - 31.2 pg   MCHC 32.1 31.8 - 35.4 g/dL   RDW,  POC 16.0 %   Platelet Count, POC 361 142 - 424 K/uL   MPV 7.9 0 - 99.8 fL   Scheduled Meds: Continuous Infusions: Meds ordered this encounter  Medications  . VENTOLIN HFA 108 (90 BASE) MCG/ACT inhaler    Sig: Inhale 2 puffs every 4-6 hrs as needed.    Dispense:  18 g    Refill:  11  . EPINEPHrine 0.3 mg/0.3 mL IJ SOAJ injection    Sig: Inject 0.3 mLs (0.3 mg total) into the muscle once.    Dispense:  1 Device    Refill:  1  . predniSONE (DELTASONE) 10 MG tablet    Sig: Take 4 a day for 3 days 3 a day for 3 days 2 a day for 3 days one a day for 3 days    Dispense:  30 tablet    Refill:  0   EKG/XRAY:   Primary read interpreted by [facility] Everlene Farrier at [facility].   ASSESSMENT/PLAN: White count is up. I think the bite reaction may be secondary to Neosporin applied to the initial bite area. She seems to be having issues with reactive airways disease now. Will treat with albuterol and Claritin. She was given a short taper of prednisone. She will keep an EpiPen at home for allergic reactions. A copy of this note will be sent to Dr. Leo Grosser.   Gross sideeffects, risk and benefits, and alternatives of medications d/w patient. Patient is aware that all medications have potential sideeffects and we are unable to predict every sideeffect or drug-drug interaction that may occur.  Arlyss Queen MD 06/01/2015 2:17 PM

## 2015-06-06 ENCOUNTER — Other Ambulatory Visit (HOSPITAL_COMMUNITY): Payer: Self-pay | Admitting: Obstetrics and Gynecology

## 2015-06-06 NOTE — H&P (Signed)
Amy Phillips is a 59 y.o.  post menopausal female  P: 2-0-2-2,   with a history of large uterine fibroids who presents for hysterectomy because of abnormal uterine bleeding and large uterine fibroids.  The patient had been amenorrheic for two years until November 2014,  following a sono-hysterogram for post menopausal bleeding, when  she began to experience daily vaginal spotting.  In December of 2014 the patient underwent  hysteroscopic resection of a fibroid along with D & C but subsequently experienced more bleeding afterwards.  Fortunately this bleeding responded to norethindrone 10-15 mg/daily, however, she would bleed more when she was physically active and with lifting.  On her heaviest bleeding days she would change her pad constantly,  but most of the time she would only require 4 pads a day.  She denies any cramping,  except with increased activity and for relief would require Tylenol with Tramadol  (Ibuprofen would cause heavier bleeding) . The patient was given 6 months of Lupron Depot with cessation of her bleeding but no change in the size of her uterine fibroids.  A pelvic MRI in October 2015 showed: a massively enlarged uterus-21.0 x 19.6 x 9.6 cm,  #2 dominant fibroids:  right-8.7 x 8.2 x 11.4 cm and left-7.9 x 7.8 x 9.7 cm along with multiple additional intra-mural fibroids ranging from 2.5 cm - 4.0 cm;  the endometrial canal was difficult to define but no submucosal component identified and there was no thickening of the junctional zone;  there was no variant in arterial anatomy and venous vascular congestion was noted;  neither ovary was well defined but a CT scan of the pelvis a month later described small cysts vs follicles seen in both ovaries.  The patient underwent consultation for both uterine fibroid embolization and ultrasound management of her uterine fibroids  however, she was not a candidate for either procedure. She also underwent GYN oncology consultation with no evidence for  malignant etiology of her bleeding. Given the protracted nature of her symptoms , limitations of medical therapy for relief, and her being determined to not be a candidate for non operative management,  the patient has decided to proceed with definitive therapy in the form of hysterectomy.    Past Medical History  OB History: G: 4;  P:2-0-2-2;   SVB: 1988 and 1991  GYN History: menarche: 59 YO    LMP: see HPI   Contraception: post menopausal  Denies history of abnormal PAP smear.  Last PAP smear: July 2013 with negative HPV  Medical History: Anxiety,  Asthma, Diverticulosis, Renal Stone, GERD  and Allergic Rhinitis  Surgical History:  Dilatation and Curettage for SAB x 2;   2014 Hysteroscopic Myomectomy Denies problems with anesthesia or history of blood transfusions  Family History: Cancer (breast & lung), COPD,  Hypertension, Osteoporosis, Heart Disease,  Esophageal Tumor and Arthritis   Social History: Married and employed as an Therapist, sports;  Denies tobacco use and occasionally uses alcohol.   Medication  Albuterol Inhaler 2 puffs every 6 hours as needed Qvar  as directed Norethindrone 5 mg  3  daily Flonase as directed prn Acyclovir 200 mg as directed as needed  No Known Allergies  Shellfish-airway reaction Peanuts-uncertain  Denies sensitivity to latex, soy or adhesives.  ROS: Admits to glasses, bug bite on right shin (May 25, 2015) associated with chills, throat tightness, body rash and myalgias. (sought PCP evaluation)  Denies headache, vision changes, nasal congestion, dysphagia, tinnitus, dizziness, hoarseness, cough,  chest pain, shortness  of breath, nausea, vomiting, diarrhea,constipation,  urinary frequency, urgency  dysuria, hematuria, vaginitis symptoms, pelvic pain, swelling of joints,easy bruising,  arthralgias, skin rashes, unexplained weight loss and except as is mentioned in the history of present illness, patient's review of systems is otherwise negative.  Physical  Exam  Bp: 132/80   P: 88  R: 14  Temperature:  99.0 degrees F orally  Weight: 130 lbs.  Height: 5' 2.5"  BMI: 23.4  Neck: supple without masses or thyromegaly Lungs: clear to auscultation Heart: regular rate and rhythm Abdomen: firm mass from pelvis to level of umbilicus and  non-tender. Pelvic:EGBUS- wnl; vagina-normal rugae; uterus-20 weeks size with limited mobility, cervix without lesions or motion tenderness; adnexae-no tenderness or masses Extremities:  no clubbing, cyanosis or edema  Endometrial Biopsy: inactive endometrium with progestational changes;  no hyperplasia or carcinoma.    Assesment:  Abnormal Uterine Bleeding            Uterine Fibroids   Disposition:  A discussion was held with patient regarding the indication for her procedure(s) along with the risks, which include but are not limited to: reaction to anesthesia, damage to adjacent organs, infection, pelvic prolapse and excessive bleeding.  The patient has requested that hysterectomy be achieved through a transverse incision. I have explained the limitations of a transverse incision for a uterus of this size in patient with limited transverse width of her pelvis. I have, however, told her that there is the possibility of a Maylard incision that will allow livery of the uterus. At this size through a transverse incision and she wishes to have this done. She understands the very small risk of needing additional space through a vertical extension of this incision. The patient verbalized understanding of these risks and has consented to proceed with a Total Abdominal Hysterectomy and Bilateral Salpingectomy at [facility] on June 12, 2015.   CSN# [id number]   Angelize Ryce J. Florene Glen, PA-C  for Dr. Seymour Bars. Haygood

## 2015-06-11 MED ORDER — DEXTROSE 5 % IV SOLN
2.0000 g | INTRAVENOUS | Status: AC
  Administered 2015-06-12: 2 g via INTRAVENOUS
  Filled 2015-06-11: qty 2

## 2015-06-12 ENCOUNTER — Inpatient Hospital Stay (HOSPITAL_COMMUNITY): Payer: 59 | Admitting: Anesthesiology

## 2015-06-12 ENCOUNTER — Inpatient Hospital Stay (HOSPITAL_COMMUNITY)
Admission: RE | Admit: 2015-06-12 | Discharge: 2015-06-14 | DRG: 743 | Disposition: A | Discharge status: Home / Self Care | Payer: 59 | Source: Ambulatory Visit | Attending: Obstetrics and Gynecology | Admitting: Obstetrics and Gynecology

## 2015-06-12 ENCOUNTER — Encounter (HOSPITAL_COMMUNITY): Payer: Self-pay | Admitting: Anesthesiology

## 2015-06-12 ENCOUNTER — Encounter (HOSPITAL_COMMUNITY)
Admission: RE | Disposition: A | Discharge status: Home / Self Care | Payer: Self-pay | Source: Ambulatory Visit | Attending: Obstetrics and Gynecology

## 2015-06-12 DIAGNOSIS — K219 Gastro-esophageal reflux disease without esophagitis: Secondary | ICD-10-CM | POA: Diagnosis present

## 2015-06-12 DIAGNOSIS — D259 Leiomyoma of uterus, unspecified: Secondary | ICD-10-CM | POA: Diagnosis present

## 2015-06-12 DIAGNOSIS — J4540 Moderate persistent asthma, uncomplicated: Secondary | ICD-10-CM

## 2015-06-12 DIAGNOSIS — J45909 Unspecified asthma, uncomplicated: Secondary | ICD-10-CM | POA: Diagnosis present

## 2015-06-12 DIAGNOSIS — N939 Abnormal uterine and vaginal bleeding, unspecified: Principal | ICD-10-CM | POA: Diagnosis present

## 2015-06-12 HISTORY — PX: BILATERAL SALPINGECTOMY: SHX5743

## 2015-06-12 HISTORY — PX: ABDOMINAL HYSTERECTOMY: SHX81

## 2015-06-12 LAB — TYPE AND SCREEN
ABO/RH(D): B POS
Antibody Screen: NEGATIVE

## 2015-06-12 LAB — ABO/RH: ABO/RH(D): B POS

## 2015-06-12 SURGERY — HYSTERECTOMY, ABDOMINAL
Anesthesia: General | Site: Abdomen

## 2015-06-12 MED ORDER — FENTANYL CITRATE (PF) 100 MCG/2ML IJ SOLN
25.0000 ug | INTRAMUSCULAR | Status: DC | PRN
  Administered 2015-06-12 (×3): 50 ug via INTRAVENOUS

## 2015-06-12 MED ORDER — SCOPOLAMINE 1 MG/3DAYS TD PT72
1.0000 | MEDICATED_PATCH | Freq: Once | TRANSDERMAL | Status: DC
  Administered 2015-06-12: 1.5 mg via TRANSDERMAL

## 2015-06-12 MED ORDER — ALPRAZOLAM 0.25 MG PO TABS: 0.2500 mg | ORAL_TABLET | Freq: Every evening | ORAL | Status: DC | PRN

## 2015-06-12 MED ORDER — NEOSTIGMINE METHYLSULFATE 10 MG/10ML IV SOLN
INTRAVENOUS | Status: DC | PRN
  Administered 2015-06-12: 4 mg via INTRAVENOUS

## 2015-06-12 MED ORDER — GLYCOPYRROLATE 0.2 MG/ML IJ SOLN
INTRAMUSCULAR | Status: AC
  Filled 2015-06-12: qty 4

## 2015-06-12 MED ORDER — ONDANSETRON HCL 4 MG/2ML IJ SOLN
INTRAMUSCULAR | Status: AC
  Filled 2015-06-12: qty 2

## 2015-06-12 MED ORDER — ONDANSETRON HCL 4 MG PO TABS
4.0000 mg | ORAL_TABLET | Freq: Three times a day (TID) | ORAL | Status: DC | PRN
  Administered 2015-06-13 – 2015-06-14 (×2): 4 mg via ORAL
  Filled 2015-06-12 (×2): qty 1

## 2015-06-12 MED ORDER — LACTATED RINGERS IV SOLN
INTRAVENOUS | Status: DC
  Administered 2015-06-12 (×3): via INTRAVENOUS

## 2015-06-12 MED ORDER — ROCURONIUM BROMIDE 100 MG/10ML IV SOLN
INTRAVENOUS | Status: AC
Start: 2015-06-12 — End: 2015-06-12
  Filled 2015-06-12: qty 1

## 2015-06-12 MED ORDER — MIDAZOLAM HCL 2 MG/2ML IJ SOLN
INTRAMUSCULAR | Status: AC
  Filled 2015-06-12: qty 4

## 2015-06-12 MED ORDER — GLYCOPYRROLATE 0.2 MG/ML IJ SOLN
INTRAMUSCULAR | Status: DC | PRN
  Administered 2015-06-12: 0.6 mg via INTRAVENOUS

## 2015-06-12 MED ORDER — ALBUTEROL SULFATE HFA 108 (90 BASE) MCG/ACT IN AERS
2.0000 | INHALATION_SPRAY | RESPIRATORY_TRACT | Status: AC
  Administered 2015-06-12: 2 via RESPIRATORY_TRACT

## 2015-06-12 MED ORDER — ONDANSETRON HCL 4 MG/2ML IJ SOLN
INTRAMUSCULAR | Status: DC | PRN
  Administered 2015-06-12: 4 mg via INTRAVENOUS

## 2015-06-12 MED ORDER — SODIUM CHLORIDE 0.9 % IJ SOLN: 9.0000 mL | INTRAMUSCULAR | Status: DC | PRN

## 2015-06-12 MED ORDER — ALBUTEROL SULFATE (2.5 MG/3ML) 0.083% IN NEBU: 3.0000 mL | INHALATION_SOLUTION | Freq: Four times a day (QID) | RESPIRATORY_TRACT | Status: DC | PRN

## 2015-06-12 MED ORDER — LACTATED RINGERS IV SOLN
INTRAVENOUS | Status: DC
  Administered 2015-06-12 – 2015-06-13 (×2): via INTRAVENOUS

## 2015-06-12 MED ORDER — KETOROLAC TROMETHAMINE 30 MG/ML IJ SOLN
30.0000 mg | Freq: Four times a day (QID) | INTRAMUSCULAR | Status: AC
  Administered 2015-06-12 – 2015-06-13 (×3): 30 mg via INTRAVENOUS
  Filled 2015-06-12 (×3): qty 1

## 2015-06-12 MED ORDER — BUPIVACAINE HCL (PF) 0.25 % IJ SOLN
INTRAMUSCULAR | Status: AC
  Filled 2015-06-12: qty 30

## 2015-06-12 MED ORDER — NEOSTIGMINE METHYLSULFATE 10 MG/10ML IV SOLN
INTRAVENOUS | Status: AC
  Filled 2015-06-12: qty 1

## 2015-06-12 MED ORDER — PROPOFOL 10 MG/ML IV BOLUS
INTRAVENOUS | Status: DC | PRN
  Administered 2015-06-12: 180 mg via INTRAVENOUS

## 2015-06-12 MED ORDER — KETOROLAC TROMETHAMINE 30 MG/ML IJ SOLN
INTRAMUSCULAR | Status: AC
  Filled 2015-06-12: qty 1

## 2015-06-12 MED ORDER — ALBUTEROL SULFATE HFA 108 (90 BASE) MCG/ACT IN AERS
INHALATION_SPRAY | RESPIRATORY_TRACT | Status: AC
  Administered 2015-06-12: 2 via RESPIRATORY_TRACT
  Filled 2015-06-12: qty 6.7

## 2015-06-12 MED ORDER — ONDANSETRON HCL 4 MG/2ML IJ SOLN: 4.0000 mg | Freq: Four times a day (QID) | INTRAMUSCULAR | Status: DC | PRN

## 2015-06-12 MED ORDER — HYDROMORPHONE 0.3 MG/ML IV SOLN
INTRAVENOUS | Status: DC
  Administered 2015-06-12: 2.4 mg via INTRAVENOUS
  Administered 2015-06-12: 18:00:00 via INTRAVENOUS
  Administered 2015-06-13: 1.8 mg via INTRAVENOUS
  Administered 2015-06-13: 0.6 mg via INTRAVENOUS
  Administered 2015-06-13: 4 mL via INTRAVENOUS
  Filled 2015-06-12: qty 25

## 2015-06-12 MED ORDER — PROPOFOL 10 MG/ML IV BOLUS
INTRAVENOUS | Status: AC
  Filled 2015-06-12: qty 20

## 2015-06-12 MED ORDER — DIPHENHYDRAMINE HCL 12.5 MG/5ML PO ELIX: 12.5000 mg | ORAL_SOLUTION | Freq: Four times a day (QID) | ORAL | Status: DC | PRN

## 2015-06-12 MED ORDER — LIDOCAINE HCL (CARDIAC) 20 MG/ML IV SOLN
INTRAVENOUS | Status: AC
  Filled 2015-06-12: qty 5

## 2015-06-12 MED ORDER — OXYCODONE-ACETAMINOPHEN 5-325 MG PO TABS
1.0000 | ORAL_TABLET | ORAL | Status: DC | PRN
  Administered 2015-06-13 (×4): 1 via ORAL
  Administered 2015-06-14 (×2): 2 via ORAL
  Filled 2015-06-12 (×3): qty 1
  Filled 2015-06-12 (×2): qty 2
  Filled 2015-06-12: qty 1

## 2015-06-12 MED ORDER — IBUPROFEN 600 MG PO TABS
600.0000 mg | ORAL_TABLET | Freq: Four times a day (QID) | ORAL | Status: DC | PRN
  Administered 2015-06-13 – 2015-06-14 (×2): 600 mg via ORAL
  Filled 2015-06-12 (×2): qty 1

## 2015-06-12 MED ORDER — BUDESONIDE 0.25 MG/2ML IN SUSP
0.2500 mg | Freq: Two times a day (BID) | RESPIRATORY_TRACT | Status: DC
  Administered 2015-06-13 – 2015-06-14 (×3): 0.25 mg via RESPIRATORY_TRACT
  Filled 2015-06-12 (×3): qty 2

## 2015-06-12 MED ORDER — FENTANYL CITRATE (PF) 250 MCG/5ML IJ SOLN
INTRAMUSCULAR | Status: AC
  Filled 2015-06-12: qty 25

## 2015-06-12 MED ORDER — MENTHOL 3 MG MT LOZG: 1.0000 | LOZENGE | OROMUCOSAL | Status: DC | PRN

## 2015-06-12 MED ORDER — ROCURONIUM BROMIDE 100 MG/10ML IV SOLN
INTRAVENOUS | Status: DC | PRN
  Administered 2015-06-12: 50 mg via INTRAVENOUS
  Administered 2015-06-12 (×3): 10 mg via INTRAVENOUS

## 2015-06-12 MED ORDER — FENTANYL CITRATE (PF) 100 MCG/2ML IJ SOLN
INTRAMUSCULAR | Status: DC | PRN
  Administered 2015-06-12 (×5): 50 ug via INTRAVENOUS

## 2015-06-12 MED ORDER — HYDROMORPHONE HCL 1 MG/ML IJ SOLN
INTRAMUSCULAR | Status: DC | PRN
  Administered 2015-06-12: 1 mg via INTRAVENOUS

## 2015-06-12 MED ORDER — KETOROLAC TROMETHAMINE 30 MG/ML IJ SOLN
INTRAMUSCULAR | Status: DC | PRN
  Administered 2015-06-12: 30 mg via INTRAVENOUS

## 2015-06-12 MED ORDER — FENTANYL CITRATE (PF) 100 MCG/2ML IJ SOLN
INTRAMUSCULAR | Status: AC
  Filled 2015-06-12: qty 2

## 2015-06-12 MED ORDER — SCOPOLAMINE 1 MG/3DAYS TD PT72
MEDICATED_PATCH | TRANSDERMAL | Status: AC
  Administered 2015-06-12: 1.5 mg via TRANSDERMAL
  Filled 2015-06-12: qty 1

## 2015-06-12 MED ORDER — LIDOCAINE HCL (CARDIAC) 20 MG/ML IV SOLN
INTRAVENOUS | Status: DC | PRN
  Administered 2015-06-12: 30 mg via INTRAVENOUS
  Administered 2015-06-12: 70 mg via INTRAVENOUS

## 2015-06-12 MED ORDER — DEXAMETHASONE SODIUM PHOSPHATE 4 MG/ML IJ SOLN
INTRAMUSCULAR | Status: AC
  Filled 2015-06-12: qty 1

## 2015-06-12 MED ORDER — BUPIVACAINE HCL (PF) 0.5 % IJ SOLN
INTRAMUSCULAR | Status: AC
  Filled 2015-06-12: qty 30

## 2015-06-12 MED ORDER — NALOXONE HCL 0.4 MG/ML IJ SOLN: 0.4000 mg | INTRAMUSCULAR | Status: DC | PRN

## 2015-06-12 MED ORDER — DIPHENHYDRAMINE HCL 50 MG/ML IJ SOLN: 12.5000 mg | Freq: Four times a day (QID) | INTRAMUSCULAR | Status: DC | PRN

## 2015-06-12 MED ORDER — ONDANSETRON HCL 4 MG/2ML IJ SOLN: 4.0000 mg | Freq: Once | INTRAMUSCULAR | Status: DC | PRN

## 2015-06-12 MED ORDER — HEPARIN SODIUM (PORCINE) 5000 UNIT/ML IJ SOLN
INTRAMUSCULAR | Status: AC
  Filled 2015-06-12: qty 1

## 2015-06-12 MED ORDER — MIDAZOLAM HCL 2 MG/2ML IJ SOLN
INTRAMUSCULAR | Status: DC | PRN
  Administered 2015-06-12: 2 mg via INTRAVENOUS

## 2015-06-12 MED ORDER — DEXAMETHASONE SODIUM PHOSPHATE 10 MG/ML IJ SOLN
INTRAMUSCULAR | Status: DC | PRN
  Administered 2015-06-12: 4 mg via INTRAVENOUS

## 2015-06-12 MED ORDER — BUPIVACAINE HCL (PF) 0.25 % IJ SOLN
INTRAMUSCULAR | Status: DC | PRN
  Administered 2015-06-12: 10 mL
  Administered 2015-06-12: 20 mL

## 2015-06-12 MED ORDER — HYDROMORPHONE HCL 1 MG/ML IJ SOLN
INTRAMUSCULAR | Status: AC
  Filled 2015-06-12: qty 1

## 2015-06-12 SURGICAL SUPPLY — 47 items
CANISTER SUCT 3000ML (MISCELLANEOUS) ×3 IMPLANT
CATH ROBINSON RED A/P 16FR (CATHETERS) IMPLANT
CLOTH BEACON ORANGE TIMEOUT ST (SAFETY) ×3 IMPLANT
CONT PATH 16OZ SNAP LID 3702 (MISCELLANEOUS) ×3 IMPLANT
CONT SPECI 4OZ STER CLIK (MISCELLANEOUS) IMPLANT
DECANTER SPIKE VIAL GLASS SM (MISCELLANEOUS) ×6 IMPLANT
DRAIN JACKSON PRT FLT 7MM (DRAIN) IMPLANT
DRAPE WARM FLUID 44X44 (DRAPE) ×3 IMPLANT
DRSG OPSITE POSTOP 4X10 (GAUZE/BANDAGES/DRESSINGS) ×3 IMPLANT
DURAPREP 26ML APPLICATOR (WOUND CARE) ×3 IMPLANT
ELECT CAUTERY BLADE 6.4 (BLADE) ×3 IMPLANT
ELECT NEEDLE TIP 2.8 STRL (NEEDLE) IMPLANT
EVACUATOR SILICONE 100CC (DRAIN) IMPLANT
GAUZE SPONGE 4X4 16PLY XRAY LF (GAUZE/BANDAGES/DRESSINGS) ×3 IMPLANT
GLOVE SURG SS PI 6.5 STRL IVOR (GLOVE) ×6 IMPLANT
GOWN STRL REUS W/TWL LRG LVL3 (GOWN DISPOSABLE) ×9 IMPLANT
LIQUID BAND (GAUZE/BANDAGES/DRESSINGS) ×3 IMPLANT
NEEDLE HYPO 22GX1.5 SAFETY (NEEDLE) ×3 IMPLANT
NEEDLE SPNL 22GX3.5 QUINCKE BK (NEEDLE) IMPLANT
NS IRRIG 1000ML POUR BTL (IV SOLUTION) ×6 IMPLANT
PACK ABDOMINAL GYN (CUSTOM PROCEDURE TRAY) ×3 IMPLANT
PAD OB MATERNITY 4.3X12.25 (PERSONAL CARE ITEMS) ×3 IMPLANT
PENCIL SMOKE EVAC W/HOLSTER (ELECTROSURGICAL) ×3 IMPLANT
PROTECTOR NERVE ULNAR (MISCELLANEOUS) ×3 IMPLANT
SPONGE LAP 18X18 X RAY DECT (DISPOSABLE) ×6 IMPLANT
STAPLER VISISTAT 35W (STAPLE) IMPLANT
SUT MNCRL AB 3-0 PS2 27 (SUTURE) ×3 IMPLANT
SUT PDS AB 0 CT1 27 (SUTURE) ×6 IMPLANT
SUT PDS AB 1 CT  36 (SUTURE)
SUT PDS AB 1 CT 36 (SUTURE) IMPLANT
SUT PLAIN 2 0 XLH (SUTURE) IMPLANT
SUT SILK 0 FSL (SUTURE) IMPLANT
SUT VIC AB 0 CT1 18XCR BRD8 (SUTURE) ×6 IMPLANT
SUT VIC AB 0 CT1 27 (SUTURE) ×3
SUT VIC AB 0 CT1 27XBRD ANBCTR (SUTURE) IMPLANT
SUT VIC AB 0 CT1 27XCR 8 STRN (SUTURE) ×2 IMPLANT
SUT VIC AB 0 CT1 8-18 (SUTURE) ×3
SUT VIC AB 2-0 CT1 (SUTURE) ×3 IMPLANT
SUT VIC AB 3-0 SH 27 (SUTURE) ×1
SUT VIC AB 3-0 SH 27X BRD (SUTURE) ×2 IMPLANT
SUT VICRYL 0 TIES 12 18 (SUTURE) ×3 IMPLANT
SYR 50ML LL SCALE MARK (SYRINGE) IMPLANT
SYR CONTROL 10ML LL (SYRINGE) IMPLANT
SYR TB 1ML 25GX5/8 (SYRINGE) ×3 IMPLANT
TOWEL OR 17X24 6PK STRL BLUE (TOWEL DISPOSABLE) ×6 IMPLANT
TRAY FOLEY CATH SILVER 14FR (SET/KITS/TRAYS/PACK) ×3 IMPLANT
WATER STERILE IRR 1000ML POUR (IV SOLUTION) IMPLANT

## 2015-06-12 NOTE — Transfer of Care (Signed)
Immediate Anesthesia Transfer of Care Note  Patient: Amy Phillips  Procedure(s) Performed: Procedure(s): HYSTERECTOMY ABDOMINAL With Vertical Incision (N/A) BILATERAL SALPINGECTOMY  Patient Location: PACU  Anesthesia Type:General  Level of Consciousness: awake, alert , oriented and patient cooperative  Airway & Oxygen Therapy: Patient Spontanous Breathing and Patient connected to nasal cannula oxygen  Post-op Assessment: Report given to RN and Post -op Vital signs reviewed and stable  Post vital signs: Reviewed and stable  Last Vitals:  Filed Vitals:   06/12/15 0952  BP: 143/86  Pulse: 77  Temp: 36.8 C  Resp: 18    Complications: No apparent anesthesia complications

## 2015-06-12 NOTE — Op Note (Addendum)
06/12/2015  2:18 PM  PATIENT:  Amy Phillips  59 y.o. female MRN:  [id number]  PRE-OPERATIVE DIAGNOSIS:  Uterine Fibroid, Abnormal Uterine Bleeding  POST-OPERATIVE DIAGNOSIS:  uterine fibroids, abnormal uterine bleeding  PROCEDURE:  Procedure(s): HYSTERECTOMY ABDOMINAL With Vertical Incision BILATERAL SALPINGECTOMY  SURGEON:  Surgeon(s): Eldred Manges, MD  ASSISTANTS:[place] Florene Glen, PA-C   ANESTHESIA:  General  ESTIMATED BLOOD LOSS: 500cc  URINE OUTPUT:  250 cc  IV FLUIDS:  2563 cc  COMPLICATIONS:none  BLOOD ADMINISTERED:none  DRAINS: Urinary Catheter (Foley)   LOCAL MEDICATIONS USED:  MARCAINE    and Amount: 30 ml  SPECIMEN:  Source of Specimen:  Uterus and right fallopian tube, cervix, left fallopian tube  DISPOSITION OF SPECIMEN:  PATHOLOGY  COUNTS:  YES   FINDINGS:  The uterus was enlarged to approximately 22 weeks size.  There were multiple myomata subserosally. There were small excrescences on the serosa that may have been very small myomata. No other excrescences were noted within the pelvis. The ovaries and tubes appeared normal bilaterally. No adenopathy was palpated in the pelvis.  PROCEDURE: The patient was taken to the operating room after appropriate identification and placed on the operating table in the supine position. Equipment for the induction of general anesthesia was placed and after the attainment of adequate general anesthesia the abdomen, perineum, and vagina were prepped with multiple layers of Betadine.a Foley catheter was inserted into the bladder under sterile conditions and connected to straight drainage. The abdomen was draped as a sterile field.  Suprapubic injection of210 cc of quarter percent Marcaine was undertaken. A midline subumbilical incision was made and the abdomen opened in layers. Peritoneum was entered and a self-retaining retractor placed in the abdominal cavity. A bladder blade was placed. Pelvic washings were obtained After  visual and palpable intraperitoneal evaluation of the anatomy was carried out large Kelly clamps were placed at the cornual regions of the uterus . Once the uterus was manipulated into the operative field.. The right round ligament was then identified clamped cut and suture ligated. That incision was taken anteriorly on the anterior leaf of the broad ligament.  The right fallopian tube was disconnected from the ovary at its fimbriated with Bovie cautery and the mesial salpinx cauterized to isolate the right fallopian tube as it was attached to the uterus. The utero-ovarian ligament was identified clamped cut, free tied and suture-ligated. A similar procedure was carried out on the opposite side with the round ligament and utero-ovarian ligament. The bladder flap was completed on the opposite side with incision of the anterior leaf of the broad ligament. The bladder was dissected off the anterior cervix with a combination of blunt and sharp dissection. The uterine arteries on the right and left side were clamped cut and suture ligated.  Parametial tissues on the right and left side were clamped, cut and suture-ligated down to the level of the cervix. The large uterine fundus was then excised from the upper cervix and removed from the operative field for better visualization. The cervical tissues were then clamped cut and suture ligated. Uterosacral ligaments were clamped cut suture-ligated and those sutures held. The vaginal angles were clamped cut suture ligated and the sutures held. The remainder of the vagina was incised and the cervix were removed from the operative field. Vaginal cuff was closed with figure-of-eight sutures of 0 Vicryl. Copious irrigation was carried out. The sutures holding the vaginal angles and uterosacral ligaments were then tied together. Hemostasis was noted to be adequate and  all instruments were removed from the peritoneal cavity.  The left fallopian tube was then elevated and clamped  then excised from the left ovary and the pedicle suture-ligated. Hemostasis was noted to be adequate.  The abdominal incision was closed using a Smead Jones closure using 0 PDS suture from each apex to  the midline. The subcutaneous tissue was made hemostatic with Bovie cautery and irrigated. The skin incision was closed with a subcuticular suture of 3-0 Monocryl. Dermabond was applied Sterile dressing was applied. The patient was awakened from general anesthesia and taken to the recovery room in satisfactory condition having tolerated the procedure well with  sponge and instrument counts correct.  PLAN OF CARE: Admit. After postanesthesia care  PATIENT DISPOSITION:  PACU - hemodynamically stable.   Delay start of Pharmacological VTE agent (>24hrs) due to surgical blood loss or risk of bleeding:  yes. SCD hose used during the entire procedure   Dekker Verga P 2:18 PM

## 2015-06-12 NOTE — Anesthesia Procedure Notes (Addendum)
Procedure Name: Intubation Date/Time: 06/12/2015 11:03 AM Performed by: Tobin Chad Pre-anesthesia Checklist: Patient identified, Patient being monitored, Timeout performed, Emergency Drugs available and Suction available Patient Re-evaluated:Patient Re-evaluated prior to inductionOxygen Delivery Method: Circle system utilized and Simple face mask Preoxygenation: Pre-oxygenation with 100% oxygen Intubation Type: IV induction Ventilation: Mask ventilation without difficulty Laryngoscope Size: Mac and 3 Grade View: Grade II Tube size: 7.0 mm Number of attempts: 1 Airway Equipment and Method: Stylet Placement Confirmation: ETT inserted through vocal cords under direct vision,  breath sounds checked- equal and bilateral and positive ETCO2 Secured at: 23 cm Tube secured with: Tape Dental Injury: Teeth and Oropharynx as per pre-operative assessment

## 2015-06-12 NOTE — Anesthesia Preprocedure Evaluation (Signed)
Anesthesia Evaluation  Patient identified by MRN, date of birth, ID band Patient awake    Reviewed: Allergy & Precautions, NPO status , Patient's Chart, lab work & pertinent test results  History of Anesthesia Complications Negative for: history of anesthetic complications  Airway Mallampati: II  TM Distance: >3 FB Neck ROM: Full    Dental no notable dental hx. (+) Dental Advisory Given   Pulmonary asthma ,    Pulmonary exam normal breath sounds clear to auscultation       Cardiovascular negative cardio ROS Normal cardiovascular exam+ dysrhythmias (hx of PVCs)  Rhythm:Regular Rate:Normal     Neuro/Psych negative neurological ROS  negative psych ROS   GI/Hepatic Neg liver ROS, GERD  Medicated and Controlled,  Endo/Other  negative endocrine ROS  Renal/GU negative Renal ROS  negative genitourinary   Musculoskeletal negative musculoskeletal ROS (+)   Abdominal   Peds negative pediatric ROS (+)  Hematology negative hematology ROS (+)   Anesthesia Other Findings   Reproductive/Obstetrics negative OB ROS                             Anesthesia Physical Anesthesia Plan  ASA: II  Anesthesia Plan: General   Post-op Pain Management:    Induction: Intravenous  Airway Management Planned: Oral ETT  Additional Equipment:   Intra-op Plan:   Post-operative Plan: Extubation in OR  Informed Consent: I have reviewed the patients History and Physical, chart, labs and discussed the procedure including the risks, benefits and alternatives for the proposed anesthesia with the patient or authorized representative who has indicated his/her understanding and acceptance.   Dental advisory given  Plan Discussed with: CRNA  Anesthesia Plan Comments:         Anesthesia Quick Evaluation

## 2015-06-12 NOTE — Anesthesia Postprocedure Evaluation (Signed)
  Anesthesia Post-op Note  Patient: Amy Phillips  Procedure(s) Performed: Procedure(s) (LRB): HYSTERECTOMY ABDOMINAL With Vertical Incision (N/A) BILATERAL SALPINGECTOMY  Patient Location: PACU  Anesthesia Type: General  Level of Consciousness: awake and alert   Airway and Oxygen Therapy: Patient Spontanous Breathing  Post-op Pain: mild  Post-op Assessment: Post-op Vital signs reviewed, Patient's Cardiovascular Status Stable, Respiratory Function Stable, Patent Airway and No signs of Nausea or vomiting  Last Vitals:  Filed Vitals:   06/12/15 1740  BP: 143/74  Pulse: 80  Temp:   Resp: 15    Post-op Vital Signs: stable   Complications: No apparent anesthesia complications

## 2015-06-12 NOTE — Brief Op Note (Signed)
06/12/2015  2:13 PM  PATIENT:  Amy Phillips  58 y.o. female  PRE-OPERATIVE DIAGNOSIS:  Uterine Fibroid, Abnormal Uterine Bleeding  POST-OPERATIVE DIAGNOSIS:  uterine fibroids, abnormal uterine bleeding  PROCEDURE:  Procedure(s): HYSTERECTOMY ABDOMINAL With Vertical Incision (N/A) BILATERAL SALPINGECTOMY  SURGEON:  Surgeon(s) and Role:    * Eldred Manges, MD - Primary  PHYSICIAN ASSISTANT: Earnstine Regal, PA-C  ASSISTANTS: Earnstine Regal, PA-C  ANESTHESIA:   general  EBL:  Total I/O In: 2200 [I.V.:2200] Out: 750 [Urine:250; Blood:500]  BLOOD ADMINISTERED:none  DRAINS: FOLEY  LOCAL MEDICATIONS USED:  MARCAINE    and Amount: 30 ml  SPECIMEN:  Source of Specimen:  Uterus with right tube, cervix, left fallopian tube  DISPOSITION OF SPECIMEN:  PATHOLOGY  COUNTS:  YES  DICTATION: .Note written in EPIC  PLAN OF CARE: Admit to inpatient   PATIENT DISPOSITION:  PACU - hemodynamically stable.   Delay start of Pharmacological VTE agent (>24hrs) due to surgical blood loss or risk of bleeding: yes.  SCDs used during the case

## 2015-06-12 NOTE — H&P (Signed)
History and Physical Interval Note:   06/12/2015   10:23 AM   Amy Phillips  has presented today for surgery, with the diagnosis of Uterine Fibroid, Abnormal Uterine Bleeding  The various methods of treatment have been discussed with the patient and family. After consideration of risks, benefits and other options for treatment, the patient has consented to  Procedure(s): HYSTERECTOMY ABDOMINAL and bilateral salpingectomy as a surgical intervention .  I have examined the pt and  reviewed the patients' chart and labs.  Questions were answered to the patient's satisfaction.  The patient has consented to a midline incision if needed.  The patient was treated for an insect bite with allergic reaction on 06/01/15 with a steroid taper which she completed yesterday.  Her insect bite lesion has resolved and she is having no further symptoms.  Will proceed with surgery.   Eldred Manges  MD

## 2015-06-12 NOTE — Progress Notes (Signed)
Day of Surgery Procedure(s) (LRB): HYSTERECTOMY ABDOMINAL With Vertical Incision (N/A) BILATERAL SALPINGECTOMY  Subjective: Patient reports no nausea, vomiting, incisional pain controlled with PCA and tolerating PO without nausea.    Objective: I have reviewed patient's vital signs and intake and output.  General: alert, cooperative, appears stated age and no distress Resp: clear to auscultation bilaterally Cardio: regular rate and rhythm, S1, S2 normal, no murmur, click, rub or gallop GI: soft, non-tender; bowel sounds normal; no masses,  no organomegaly.  Dressing with single small blood spot Extremities: extremities normal, atraumatic, no cyanosis or edema Vaginal Bleeding: none  Assessment: s/p Procedure(s): HYSTERECTOMY ABDOMINAL With Vertical Incision (N/A) BILATERAL SALPINGECTOMY: stable  Plan: Advance diet Encourage ambulation Advance to PO medication Discontinue IV fluids all in am  LOS: 0 days    HAYGOOD,VANESSA P 06/12/2015, 11:53 PM

## 2015-06-13 ENCOUNTER — Encounter (HOSPITAL_COMMUNITY): Payer: Self-pay | Admitting: Obstetrics and Gynecology

## 2015-06-13 LAB — CBC
HCT: 32.3 % — ABNORMAL LOW (ref 36.0–46.0)
Hemoglobin: 10.6 g/dL — ABNORMAL LOW (ref 12.0–15.0)
MCH: 26 pg (ref 26.0–34.0)
MCHC: 32.8 g/dL (ref 30.0–36.0)
MCV: 79.2 fL (ref 78.0–100.0)
Platelets: 298 10*3/uL (ref 150–400)
RBC: 4.08 MIL/uL (ref 3.87–5.11)
RDW: 15.6 % — ABNORMAL HIGH (ref 11.5–15.5)
WBC: 13.1 10*3/uL — ABNORMAL HIGH (ref 4.0–10.5)

## 2015-06-13 MED FILL — Heparin Sodium (Porcine) Inj 5000 Unit/ML: INTRAMUSCULAR | Qty: 1 | Status: AC

## 2015-06-13 NOTE — Anesthesia Postprocedure Evaluation (Signed)
  Anesthesia Post-op Note  Patient: Amy Phillips  Procedure(s) Performed: Procedure(s): HYSTERECTOMY ABDOMINAL With Vertical Incision (N/A) BILATERAL SALPINGECTOMY  Patient Location: [facility]  Anesthesia Type:General  Level of Consciousness: awake, alert  and oriented  Airway and Oxygen Therapy: Patient Spontanous Breathing and Patient connected to nasal cannula oxygen  Post-op Pain: none  Post-op Assessment: Post-op Vital signs reviewed, Patient's Cardiovascular Status Stable, Respiratory Function Stable, Patent Airway, No signs of Nausea or vomiting, Adequate PO intake and Pain level controlled              Post-op Vital Signs: Reviewed and stable  Last Vitals:  Filed Vitals:   06/13/15 0822  BP:   Pulse:   Temp:   Resp: 15    Complications: No apparent anesthesia complications

## 2015-06-13 NOTE — Progress Notes (Signed)
Amy Phillips is a64 y.o.  [id number]  Post Op Date # 1:  TAH/BS  Subjective: Patient is Doing well postoperatively. Patient has Pain is controlled with current analgesics. Medications being used: prescription NSAID's including Ketorolac and narcotic analgesics including Hydromorphone. Experienced transient lightheadedness with initial ambulation-now resolved,  tolerating crackers and liquids and hasn't voided since Foley was removed  Objective: Vital signs in last 24 hours: Temp:  [98.1 F (36.7 C)-98.5 F (36.9 C)] 98.4 F (36.9 C) (09/21 0541) Pulse Rate:  [74-100] 74 (09/21 0541) Resp:  [11-18] 15 (09/21 0822) BP: (123-143)/(65-86) 130/65 mmHg (09/21 0541) SpO2:  [96 %-100 %] 98 % (09/21 0822) Weight:  [58.514 kg (129 lb)] 58.514 kg (129 lb) (09/20 1900)  Intake/Output from previous day: 09/20 0701 - 09/21 0700 In: 4865.1 [P.O.:1080; I.V.:3785.1] Out: 3250 [Urine:2750] Intake/Output this shift:    Recent Labs Lab 06/13/15 0535  WBC 13.1*  HGB 10.6*  HCT 32.3*  PLT 298    EXAM: General: alert, cooperative and no distress Resp: clear to auscultation bilaterally Cardio: regular rate and rhythm, S1, S2 normal, no murmur, click, rub or gallop GI: Bowel sounds present,  incision dressing intact with a single 3 mm dried stain in upper third of incision Extremities: Homans sign is negative, no sign of DVT and SCD hose in place-functioning with no calf tenerness   Assessment: s/p Procedure(s): HYSTERECTOMY ABDOMINAL With Vertical Incision BILATERAL SALPINGECTOMY: stable and progressing well  Plan: Routine Care  D/c PCA, start oral pain meds, ambulate, advance diet Anticipate d/c home POD#2  LOS: 1 day    HAYGOOD,VANESSA P, PA-C 06/13/2015 8:46 AM

## 2015-06-13 NOTE — Addendum Note (Signed)
Addendum  created 06/13/15 0539 by Jonna Munro, CRNA   Modules edited: Notes Section   Notes Section:  File: [id number]

## 2015-06-14 MED ORDER — OXYCODONE-ACETAMINOPHEN 5-325 MG PO TABS: 1.0000 | ORAL_TABLET | Freq: Four times a day (QID) | ORAL | Status: DC | PRN

## 2015-06-14 MED ORDER — PANTOPRAZOLE SODIUM 40 MG PO TBEC: 40.0000 mg | DELAYED_RELEASE_TABLET | Freq: Every day | ORAL | Status: DC

## 2015-06-14 MED ORDER — IBUPROFEN 600 MG PO TABS: ORAL_TABLET | ORAL | Status: DC

## 2015-06-14 MED ORDER — ONDANSETRON HCL 4 MG PO TABS: 4.0000 mg | ORAL_TABLET | Freq: Three times a day (TID) | ORAL | Status: DC | PRN

## 2015-06-14 NOTE — Discharge Instructions (Signed)
Call [facility] @ [phone] if:  You have a temperature greater than or equal to 100.4 degrees Farenheit orally You have pain that is not made better by the pain medication given and taken as directed You have excessive bleeding or problems urinating  Take Colace (Docusate Sodium/Stool Softener) 100 mg 2-3 times daily while taking narcotic pain medicine to avoid constipation or until bowel movements are regular. Take Ibuprofen 600 mg with food every 6 hours for 5 days then as needed for pain Take iron supplement of your choice twice daily for the next 12 weeks  You may drive after 2  weeks You may walk up steps  You may shower  You may resume a regular diet  Keep incisions clean and dry Do not lift over 15 pounds for 6 weeks Avoid anything in vagina for 6 weeks (or until after your post-operative visit)  Keep follow up appointment with Dr. Leo Grosser on July 25, 2015 at 10:30 a.m. Abdominal Hysterectomy, Care After Refer to this sheet in the next few weeks. These instructions provide you with information on caring for yourself after your procedure. Your health care provider may also give you more specific instructions. Your treatment has been planned according to current medical practices, but problems sometimes occur. Call your health care provider if you have any problems or questions after your procedure.  WHAT TO EXPECT AFTER THE PROCEDURE After your procedure, it is typical to have the following:  Pain.  Feeling tired.  Poor appetite.  Less interest in sex. HOME CARE INSTRUCTIONS  It takes 4-6 weeks to recover from this surgery. Make sure you follow all your health care provider's instructions. Home care instructions may include:  Take pain medicines only as directed by your health care provider. Do not take over-the-counter pain medicines without checking with your health care provider first.  Change your bandage as directed by your health care  provider.  Return to your health care provider to have your sutures taken out.  Take showers instead of baths for 2-3 weeks. Ask your health care provider when it is safe to start showering.  Do not douche, use tampons, or have sexual intercourse for at least 6 weeks or until your health care provider says you can.   Follow your health care provider's advice about exercise, lifting, driving, and general activities.  Get plenty of rest and sleep.   Do not lift anything heavier than a gallon of milk (about 10 lb [4.5 kg]) for the first month after surgery.  You can resume your normal diet if your health care provider says it is okay.   Do not drink alcohol until your health care provider says you can.   If you are constipated, ask your health care provider if you can take a mild laxative.  Eating foods high in fiber may also help with constipation. Eat plenty of raw fruits and vegetables, whole grains, and beans.  Drink enough fluids to keep your urine clear or pale yellow.   Try to have someone at home with you for the first 1-2 weeks to help around the house.  Keep all follow-up appointments. SEEK MEDICAL CARE IF:   You have chills or fever.  You have swelling, redness, or pain in the area of your incision that is getting worse.   You have pus coming from the incision.   You notice a bad smell coming from the incision or bandage.   Your incision breaks open.   You feel dizzy  or light-headed.   You have pain or bleeding when you urinate.   You have persistent diarrhea.   You have persistent nausea and vomiting.   You have abnormal vaginal discharge.   You have a rash.   You have any type of abnormal reaction or develop an allergy to your medicine.   Your pain medicine is not helping.  SEEK IMMEDIATE MEDICAL CARE IF:   You have a fever and your symptoms suddenly get worse.  You have severe abdominal pain.  You have chest pain.  You have  shortness of breath.  You faint.  You have pain, swelling, or redness of your leg.  You have heavy vaginal bleeding with blood clots. MAKE SURE YOU:  Understand these instructions.  Will watch your condition.  Will get help right away if you are not doing well or get worse. Document Released: 03/28/2005 Document Revised: 09/13/2013 Document Reviewed: 07/01/2013 [facility] Patient Information 2015 [place], [place]. This information is not intended to replace advice given to you by your health care provider. Make sure you discuss any questions you have with your health care provider.

## 2015-06-14 NOTE — Discharge Summary (Signed)
Physician Discharge Summary  Patient ID: Amy Phillips MRN: [id number] DOB/AGE: July 11, 1956 59 y.o.  Admit date: 06/12/2015 Discharge date: 06/14/2015   Discharge Diagnoses: Abnormal Uterine Bleeding and Large Uterine Fibroids  Active Problems:   Fibroid, uterine   Operation: Total Abdominal Hysterectomy with Bilateral Salpingectomy   Discharged Condition: Good  Hospital Course: On the date of admission, the patient underwent the aforementioned procedures and tolerated them well.  Post operative course was unremarkable with the patient tolerating a post operative hemoglobin of 10.6.  By post operative day #2 the patient has resumed bowel and bladder function and was therefore ready for discharge home.  Disposition: 01-Home or Self Care  Discharge Medications:    Medication List    STOP taking these medications        acetaminophen 500 MG tablet  Commonly known as:  TYLENOL     norethindrone 5 MG tablet  Commonly known as:  AYGESTIN      TAKE these medications        ALPRAZolam 0.25 MG tablet  Commonly known as:  XANAX  Take 1 tablet (0.25 mg total) by mouth at bedtime as needed for anxiety or sleep.     fluticasone 50 MCG/ACT nasal spray  Commonly known as:  FLONASE  Place 2 sprays into both nostrils as needed for allergies.     ibuprofen 600 MG tablet  Commonly known as:  ADVIL,MOTRIN  1  po  pc every 6 hours for 5 days then prn-pain     ondansetron 4 MG tablet  Commonly known as:  ZOFRAN  Take 1 tablet (4 mg total) by mouth every 8 (eight) hours as needed for nausea or vomiting.     oxyCODONE-acetaminophen 5-325 MG per tablet  Commonly known as:  PERCOCET/ROXICET  Take 1-2 tablets by mouth every 6 (six) hours as needed for severe pain (moderate to severe pain (when tolerating fluids)).     QVAR 80 MCG/ACT inhaler  Generic drug:  beclomethasone  Inhale 2 puff into the lungs bid as directed.     valACYclovir 1000 MG tablet  Commonly known as:  VALTREX  Take 2  tablets by mouth twice daily for 1 day. Start at first sign of Onset.     VENTOLIN HFA 108 (90 BASE) MCG/ACT inhaler  Generic drug:  albuterol  Inhale 2 puffs every 4-6 hrs as needed.           Follow-up: Dr. Lorriane Shire P. Haygood on July 25, 2015 at 10:30 a.m.    SignedEarnstine Regal, PA-C 06/14/2015, 7:54 AM

## 2015-06-14 NOTE — Progress Notes (Signed)
Patient discharged home with husband... Discharge instructions reviewed with patient and she verbalized understanding... Condition stable... No equipment... Taken to car via wheelchair by C. Ovid Curd, [place].

## 2015-06-14 NOTE — Progress Notes (Addendum)
Amy Phillips is a18 y.o.  [id number]  Post Op Date # 2:  TAH/BS  Subjective: Patient is Doing well postoperatively. Patient has Pain is controlled with current analgesics. Medications being used: prescription NSAID's including Ibuprofen 600 mg and narcotic analgesics including oxycodone/acetaminophen (Percocet, Tylox).Ambulating, voiding and tolerating a regular diet without difficulty. Had single episode of nausea yesterday and this am, single episode vomiting with complete relief of nausea thereafter and no further nausea yesterday.   Objective: Vital signs in last 24 hours: Temp:  [98.4 F (36.9 C)-98.7 F (37.1 C)] 98.6 F (37 C) (09/22 0540) Pulse Rate:  [69-87] 69 (09/22 0540) Resp:  [16-18] 18 (09/22 0540) BP: (112-138)/(66-73) 112/66 mmHg (09/22 0540) SpO2:  [97 %-100 %] 100 % (09/22 0540)  Intake/Output from previous day: 09/21 0701 - 09/22 0700 In: 700 [I.V.:700] Out: 1200  Intake/Output this shift: Total I/O In: -  Out: 50 [Emesis/NG output:50]  Recent Labs Lab 06/13/15 0535  WBC 13.1*  HGB 10.6*  HCT 32.3*  PLT 298  Pathology= uterus with fibroids and adenomyosis.   Cytology= neg  EXAM: General: alert, cooperative and no distress Resp: clear to auscultation bilaterally Cardio: regular rate and rhythm, S1, S2 normal, no murmur, click, rub or gallop GI: Bowel sounds present,  incision dressing with same dried stain mentioned on yesterday, intact and through the honeycomb windows, no evidence of infection. Extremities: No calf tenderness   Assessment: s/p Procedure(s): HYSTERECTOMY ABDOMINAL With Vertical Incision BILATERAL SALPINGECTOMY: stable, progressing well and anemia  Possible GERD s/p prednisone course  Plan: Discharge home  Ad pantoprazole to discharge meds  LOS: 2 days    HAYGOOD,VANESSA P, PA-C 06/14/2015 10:09 AM

## 2015-09-20 ENCOUNTER — Ambulatory Visit (INDEPENDENT_AMBULATORY_CARE_PROVIDER_SITE_OTHER): Payer: 59 | Admitting: Family Medicine

## 2015-09-20 ENCOUNTER — Encounter: Payer: Self-pay | Admitting: Family Medicine

## 2015-09-20 VITALS — BP 118/73 | HR 98 | Temp 99.5°F | Resp 16 | Ht 62.5 in | Wt 130.0 lb

## 2015-09-20 DIAGNOSIS — F419 Anxiety disorder, unspecified: Secondary | ICD-10-CM | POA: Diagnosis not present

## 2015-09-20 DIAGNOSIS — J4540 Moderate persistent asthma, uncomplicated: Secondary | ICD-10-CM | POA: Diagnosis not present

## 2015-09-20 DIAGNOSIS — H01003 Unspecified blepharitis right eye, unspecified eyelid: Secondary | ICD-10-CM | POA: Diagnosis not present

## 2015-09-20 MED ORDER — LORATADINE 10 MG PO TABS: 10.0000 mg | ORAL_TABLET | Freq: Every day | ORAL | Status: DC

## 2015-09-20 MED ORDER — OLOPATADINE HCL 0.1 % OP SOLN: 1.0000 [drp] | Freq: Two times a day (BID) | OPHTHALMIC | Status: DC

## 2015-09-20 MED ORDER — ALPRAZOLAM 0.25 MG PO TABS: 0.2500 mg | ORAL_TABLET | Freq: Every evening | ORAL | Status: DC | PRN

## 2015-09-20 MED ORDER — BACITRACIN-POLYMYX-NEO-HC 1 % OP OINT: TOPICAL_OINTMENT | OPHTHALMIC | Status: DC

## 2015-09-20 MED ORDER — QVAR 80 MCG/ACT IN AERS: INHALATION_SPRAY | RESPIRATORY_TRACT | Status: DC

## 2015-09-20 MED ORDER — FLUTICASONE PROPIONATE 50 MCG/ACT NA SUSP: 2.0000 | NASAL | Status: DC | PRN

## 2015-09-20 MED ORDER — DOXYCYCLINE HYCLATE 100 MG PO CAPS: 100.0000 mg | ORAL_CAPSULE | Freq: Two times a day (BID) | ORAL | Status: DC

## 2015-09-20 MED ORDER — VENTOLIN HFA 108 (90 BASE) MCG/ACT IN AERS: INHALATION_SPRAY | RESPIRATORY_TRACT | Status: DC

## 2015-09-20 NOTE — Progress Notes (Signed)
Subjective:    Patient ID: Amy Phillips, female    DOB: 03-May-1956, 59 y.o.   MRN: [id number] Chief Complaint  Patient presents with  . Eye Drainage    bilateral  . red eyes    itching    HPI  Works in [employer] and has used ciloxam opthamalic ointment at home which she used w/o effect Also started twice a day doxycydline Then woke up this morning with nasal congestion and so started afrin. Can feel swelling, a little sclera and  qvar and flonase reg and feels it flair up but not changed from baseline currently.  3 months for 30 days on the alprazolam    Loratadine  Has epi-pen as she had a bugbite - spider - which did cause airway flair.  Started with nausea/vertigo and gerd - so started on prilosec then switched prevacid and also has tried protonix.  Past Medical History  Diagnosis Date  . Allergy   . Asthma   . Anxiety   . GERD (gastroesophageal reflux disease)   . Uterine fibroid 2006    16 week size  . Diverticulitis 08/05/2012    Dr Collene Mares  . Arrhythmia     PVCs- no meds   Current Outpatient Prescriptions on File Prior to Visit  Medication Sig Dispense Refill  . pantoprazole (PROTONIX) 40 MG tablet Take 1 tablet (40 mg total) by mouth daily. 30 tablet 2  . valACYclovir (VALTREX) 1000 MG tablet Take 2 tablets by mouth twice daily for 1 day. Start at first sign of Onset. 30 tablet 0   No current facility-administered medications on file prior to visit.   Allergies  Allergen Reactions  . Peanuts [Peanut Oil] Other (See Comments)    Mood changes per pt and husband   . Shellfish Allergy Other (See Comments)    Throat irritation  . Strawberry (Diagnostic)     Review of Systems  Constitutional: Negative for fever, chills, diaphoresis and activity change.  HENT: Positive for congestion, postnasal drip and rhinorrhea. Negative for dental problem, ear pain, sinus pressure, sore throat and tinnitus.   Eyes: Positive for pain, discharge, redness and itching. Negative for  photophobia and visual disturbance.  Respiratory: Negative for cough, chest tightness, shortness of breath and wheezing.   Musculoskeletal: Negative for neck pain and neck stiffness.  Skin: Positive for color change and rash. Negative for wound.  Neurological: Negative for dizziness, syncope, facial asymmetry, weakness, light-headedness, numbness and headaches.  Hematological: Negative for adenopathy.  Psychiatric/Behavioral: Negative for sleep disturbance.       Objective:  BP 118/73 mmHg  Pulse 98  Temp(Src) 99.5 F (37.5 C)  Resp 16  Ht 5' 2.5" (1.588 m)  Wt 130 lb (58.968 kg)  BMI 23.38 kg/m2  LMP  (LMP Unknown)  Physical Exam  Constitutional: She is oriented to person, place, and time. She appears well-developed and well-nourished. She appears ill. No distress.  HENT:  Head: Normocephalic and atraumatic.  Right Ear: External ear and ear canal normal. Tympanic membrane is retracted. A middle ear effusion is present.  Left Ear: External ear and ear canal normal. Tympanic membrane is retracted. A middle ear effusion is present.  Nose: Mucosal edema and rhinorrhea present. Right sinus exhibits maxillary sinus tenderness. Left sinus exhibits maxillary sinus tenderness.  Mouth/Throat: Uvula is midline and mucous membranes are normal. Posterior oropharyngeal erythema present. No oropharyngeal exudate, posterior oropharyngeal edema or tonsillar abscesses.  Eyes: Conjunctivae and EOM are normal. Pupils are equal, round, and reactive  to light. Lids are everted and swept, no foreign bodies found. Right eye exhibits no discharge. Left eye exhibits no discharge. No scleral icterus.  Slit lamp exam:      The right eye shows no corneal abrasion, no corneal flare, no corneal ulcer, no foreign body and no fluorescein uptake.  Mild amount of erythema and edema over medial upper right lid. Slight injection/hypervascular.  Neck: Normal range of motion. Neck supple.  Cardiovascular: Normal rate,  regular rhythm, normal heart sounds and intact distal pulses.   Pulmonary/Chest: Effort normal and breath sounds normal.  Musculoskeletal: Normal range of motion.  Lymphadenopathy:       Head (right side): Submandibular adenopathy present. No preauricular and no posterior auricular adenopathy present.       Head (left side): Submandibular adenopathy present. No preauricular and no posterior auricular adenopathy present.    She has no cervical adenopathy.       Right: No supraclavicular adenopathy present.       Left: No supraclavicular adenopathy present.  Neurological: She is alert and oriented to person, place, and time.  Skin: Skin is warm and dry. She is not diaphoretic. No erythema.  Psychiatric: She has a normal mood and affect. Her behavior is normal.          Assessment & Plan:  Call in prednisone taper if no improvement over the next few days 1. Blepharitis of eyelid of right eye   2. Anxiety   3. Asthma, moderate persistent, uncomplicated   ok to cont on doxycycline course - she is on day 3 from a prior rx she was given for acne. Has chosen Dr. Carlota Raspberry to be her PCP but has not seen him in years, frustrated that she can't get her xanax refilled even when she uses it sparingly and on low dose for many years. Reviewed [facility] controlled substance policy. F/u w/ PCP for refills.  Meds ordered this encounter  Medications  . phenylephrine (NEO-SYNEPHRINE) 0.25 % nasal spray    Sig: Place 1 spray into both nostrils every 6 (six) hours as needed for congestion.  . ciprofloxacin (CILOXAN) 0.3 % ophthalmic ointment    Sig: 3 (three) times daily.  Marland Kitchen DISCONTD: doxycycline (VIBRAMYCIN) 100 MG capsule    Sig: Take 100 mg by mouth 2 (two) times daily.  . baci-polymyx-neo-hydrocort (CORTISPORIN) 1 % ointment    Sig: Place into both eyes every 4 (four) hours. As instructed by physician    Dispense:  3.5 g    Refill:  2  . ALPRAZolam (XANAX) 0.25 MG tablet    Sig: Take 1 tablet (0.25 mg  total) by mouth at bedtime as needed for anxiety or sleep.    Dispense:  30 tablet    Refill:  2  . QVAR 80 MCG/ACT inhaler    Sig: Inhale 2 puff into the lungs bid as directed.    Dispense:  8.7 g    Refill:  5  . VENTOLIN HFA 108 (90 Base) MCG/ACT inhaler    Sig: Inhale 2 puffs every 4-6 hrs as needed.    Dispense:  18 g    Refill:  11  . fluticasone (FLONASE) 50 MCG/ACT nasal spray    Sig: Place 2 sprays into both nostrils as needed for allergies.    Dispense:  16 g    Refill:  5  . loratadine (CLARITIN) 10 MG tablet    Sig: Take 1 tablet (10 mg total) by mouth daily.    Dispense:  30  tablet    Refill:  11  . olopatadine (PATANOL) 0.1 % ophthalmic solution    Sig: Place 1 drop into both eyes 2 (two) times daily. For eye itching    Dispense:  5 mL    Refill:  12  . doxycycline (VIBRAMYCIN) 100 MG capsule    Sig: Take 1 capsule (100 mg total) by mouth 2 (two) times daily.    Dispense:  20 capsule    Refill:  0    Delman Cheadle, MD MPH

## 2015-11-05 DIAGNOSIS — N952 Postmenopausal atrophic vaginitis: Secondary | ICD-10-CM | POA: Diagnosis not present

## 2015-11-05 DIAGNOSIS — N9412 Deep dyspareunia: Secondary | ICD-10-CM | POA: Diagnosis not present

## 2015-11-05 DIAGNOSIS — N930 Postcoital and contact bleeding: Secondary | ICD-10-CM | POA: Diagnosis not present

## 2015-11-05 MED FILL — ESTRACE 0.01% CREAM: 0.1 | 90 days supply | Qty: 43 | Fill #0

## 2015-12-10 ENCOUNTER — Telehealth: Payer: 59 | Admitting: Family

## 2015-12-10 DIAGNOSIS — J019 Acute sinusitis, unspecified: Secondary | ICD-10-CM

## 2015-12-10 MED ORDER — AMOXICILLIN-POT CLAVULANATE 875-125 MG PO TABS: 1.0000 | ORAL_TABLET | Freq: Two times a day (BID) | ORAL | Status: DC

## 2015-12-10 MED FILL — AMOX TR-K CLV 875-125 MG TA: 875-125 | 7 days supply | Qty: 14 | Fill #0

## 2015-12-10 NOTE — Progress Notes (Signed)

## 2016-01-03 ENCOUNTER — Other Ambulatory Visit: Payer: Self-pay | Admitting: Physician Assistant

## 2016-01-09 MED FILL — ALPRAZolam 0.25 MG TABS: 0.25 | 30 days supply | Qty: 30 | Fill #0

## 2016-02-14 MED FILL — QVAR 80 MCG ORAL INHALER: 80 | 30 days supply | Qty: 9 | Fill #1

## 2016-02-14 MED FILL — FLUTICASONE PROP 50 MCG SPR: 50 | 30 days supply | Qty: 16 | Fill #1

## 2016-02-27 ENCOUNTER — Ambulatory Visit (INDEPENDENT_AMBULATORY_CARE_PROVIDER_SITE_OTHER): Payer: 59 | Admitting: Urgent Care

## 2016-02-27 ENCOUNTER — Ambulatory Visit (INDEPENDENT_AMBULATORY_CARE_PROVIDER_SITE_OTHER): Payer: 59

## 2016-02-27 VITALS — BP 110/71 | HR 80 | Temp 98.9°F | Resp 17 | Ht 63.5 in | Wt 135.0 lb

## 2016-02-27 DIAGNOSIS — R0789 Other chest pain: Secondary | ICD-10-CM | POA: Diagnosis not present

## 2016-02-27 DIAGNOSIS — R490 Dysphonia: Secondary | ICD-10-CM | POA: Diagnosis not present

## 2016-02-27 DIAGNOSIS — J029 Acute pharyngitis, unspecified: Secondary | ICD-10-CM | POA: Diagnosis not present

## 2016-02-27 DIAGNOSIS — J4530 Mild persistent asthma, uncomplicated: Secondary | ICD-10-CM

## 2016-02-27 DIAGNOSIS — R062 Wheezing: Secondary | ICD-10-CM | POA: Diagnosis not present

## 2016-02-27 MED ORDER — PREDNISONE 20 MG PO TABS: ORAL_TABLET | ORAL | Status: DC

## 2016-02-27 MED ORDER — HYDROCODONE-HOMATROPINE 5-1.5 MG/5ML PO SYRP: 5.0000 mL | ORAL_SOLUTION | Freq: Three times a day (TID) | ORAL | Status: DC | PRN

## 2016-02-27 MED ORDER — ALBUTEROL SULFATE (2.5 MG/3ML) 0.083% IN NEBU
2.5000 mg | INHALATION_SOLUTION | Freq: Once | RESPIRATORY_TRACT | Status: AC
  Administered 2016-02-27: 2.5 mg via RESPIRATORY_TRACT

## 2016-02-27 MED ORDER — IPRATROPIUM BROMIDE 0.02 % IN SOLN
0.5000 mg | Freq: Once | RESPIRATORY_TRACT | Status: AC
  Administered 2016-02-27: 0.5 mg via RESPIRATORY_TRACT

## 2016-02-27 MED FILL — predniSONE 20 MG TABS: 20 | 5 days supply | Qty: 10 | Fill #0

## 2016-02-27 NOTE — Addendum Note (Signed)
Addended by: Jaynee Eagles on: 02/27/2016 05:46 PM   Modules accepted: Orders, SmartSet

## 2016-02-27 NOTE — Patient Instructions (Signed)
Asthma, Adult Asthma is a recurring condition in which the airways tighten and narrow. Asthma can make it difficult to breathe. It can cause coughing, wheezing, and shortness of breath. Asthma episodes, also called asthma attacks, range from minor to life-threatening. Asthma cannot be cured, but medicines and lifestyle changes can help control it. CAUSES Asthma is believed to be caused by inherited (genetic) and environmental factors, but its exact cause is unknown. Asthma may be triggered by allergens, lung infections, or irritants in the air. Asthma triggers are different for each person. Common triggers include:   Animal dander.  Dust mites.  Cockroaches.  Pollen from trees or grass.  Mold.  Smoke.  Air pollutants such as dust, household cleaners, hair sprays, aerosol sprays, paint fumes, strong chemicals, or strong odors.  Cold air, weather changes, and winds (which increase molds and pollens in the air).  Strong emotional expressions such as crying or laughing hard.  Stress.  Certain medicines (such as aspirin) or types of drugs (such as beta-blockers).  Sulfites in foods and drinks. Foods and drinks that may contain sulfites include dried fruit, potato chips, and sparkling grape juice.  Infections or inflammatory conditions such as the flu, a cold, or an inflammation of the nasal membranes (rhinitis).  Gastroesophageal reflux disease (GERD).  Exercise or strenuous activity. SYMPTOMS Symptoms may occur immediately after asthma is triggered or many hours later. Symptoms include:  Wheezing.  Excessive nighttime or early morning coughing.  Frequent or severe coughing with a common cold.  Chest tightness.  Shortness of breath. DIAGNOSIS  The diagnosis of asthma is made by a review of your medical history and a physical exam. Tests may also be performed. These may include:  Lung function studies. These tests show how much air you breathe in and out.  Allergy  tests.  Imaging tests such as X-rays. TREATMENT  Asthma cannot be cured, but it can usually be controlled. Treatment involves identifying and avoiding your asthma triggers. It also involves medicines. There are 2 classes of medicine used for asthma treatment:   Controller medicines. These prevent asthma symptoms from occurring. They are usually taken every day.  Reliever or rescue medicines. These quickly relieve asthma symptoms. They are used as needed and provide short-term relief. Your health care provider will help you create an asthma action plan. An asthma action plan is a written plan for managing and treating your asthma attacks. It includes a list of your asthma triggers and how they may be avoided. It also includes information on when medicines should be taken and when their dosage should be changed. An action plan may also involve the use of a device called a peak flow meter. A peak flow meter measures how well the lungs are working. It helps you monitor your condition. HOME CARE INSTRUCTIONS   Take medicines only as directed by your health care provider. Speak with your health care provider if you have questions about how or when to take the medicines.  Use a peak flow meter as directed by your health care provider. Record and keep track of readings.  Understand and use the action plan to help minimize or stop an asthma attack without needing to seek medical care.  Control your home environment in the following ways to help prevent asthma attacks:  Do not smoke. Avoid being exposed to secondhand smoke.  Change your heating and air conditioning filter regularly.  Limit your use of fireplaces and wood stoves.  Get rid of pests (such as roaches   and mice) and their droppings.  Throw away plants if you see mold on them.  Clean your floors and dust regularly. Use unscented cleaning products.  Try to have someone else vacuum for you regularly. Stay out of rooms while they are  being vacuumed and for a short while afterward. If you vacuum, use a dust mask from a hardware store, a double-layered or microfilter vacuum cleaner bag, or a vacuum cleaner with a HEPA filter.  Replace carpet with wood, tile, or vinyl flooring. Carpet can trap dander and dust.  Use allergy-proof pillows, mattress covers, and box spring covers.  Wash bed sheets and blankets every week in hot water and dry them in a dryer.  Use blankets that are made of polyester or cotton.  Clean bathrooms and kitchens with bleach. If possible, have someone repaint the walls in these rooms with mold-resistant paint. Keep out of the rooms that are being cleaned and painted.  Wash hands frequently. SEEK MEDICAL CARE IF:   You have wheezing, shortness of breath, or a cough even if taking medicine to prevent attacks.  The colored mucus you cough up (sputum) is thicker than usual.  Your sputum changes from clear or white to yellow, green, gray, or bloody.  You have any problems that may be related to the medicines you are taking (such as a rash, itching, swelling, or trouble breathing).  You are using a reliever medicine more than 2-3 times per week.  Your peak flow is still at 50-79% of your personal best after following your action plan for 1 hour.  You have a fever. SEEK IMMEDIATE MEDICAL CARE IF:   You seem to be getting worse and are unresponsive to treatment during an asthma attack.  You are short of breath even at rest.  You get short of breath when doing very little physical activity.  You have difficulty eating, drinking, or talking due to asthma symptoms.  You develop chest pain.  You develop a fast heartbeat.  You have a bluish color to your lips or fingernails.  You are light-headed, dizzy, or faint.  Your peak flow is less than 50% of your personal best.   This information is not intended to replace advice given to you by your health care provider. Make sure you discuss any  questions you have with your health care provider.   Document Released: 09/08/2005 Document Revised: 05/30/2015 Document Reviewed: 04/07/2013 Elsevier Interactive Patient Education [employer].     Allergies An allergy is an abnormal reaction to a substance by the body's defense system (immune system). Allergies can develop at any age. WHAT CAUSES ALLERGIES? An allergic reaction happens when the immune system mistakenly reacts to a normally harmless substance, called an allergen, as if it were harmful. The immune system releases antibodies to fight the substance. Antibodies eventually release a chemical called histamine into the bloodstream. The release of histamine is meant to protect the body from infection, but it also causes discomfort. An allergic reaction can be triggered by:  Eating an allergen.  Inhaling an allergen.  Touching an allergen. WHAT TYPES OF ALLERGIES ARE THERE? There are many types of allergies. Common types include:  Seasonal allergies. People with this type of allergy are usually allergic to substances that are only present during certain seasons, such as molds and pollens.  Food allergies.  Drug allergies.  Insect allergies.  Animal dander allergies. WHAT ARE SYMPTOMS OF ALLERGIES? Possible allergy symptoms include:  Swelling of the lips, face, tongue, mouth,  or throat.  Sneezing, coughing, or wheezing.  Nasal congestion.  Tingling in the mouth.  Rash.  Itching.  Itchy, red, swollen areas of skin (hives).  Watery eyes.  Vomiting.  Diarrhea.  Dizziness.  Lightheadedness.  Fainting.  Trouble breathing or swallowing.  Chest tightness.  Rapid heartbeat. HOW ARE ALLERGIES DIAGNOSED? Allergies are diagnosed with a medical and family history and one or more of the following:  Skin tests.  Blood tests.  A food diary. A food diary is a record of all the foods and drinks you have in a day and of all the symptoms you  experience.  The results of an elimination diet. An elimination diet involves eliminating foods from your diet and then adding them back in one by one to find out if a certain food causes an allergic reaction. HOW ARE ALLERGIES TREATED? There is no cure for allergies, but allergic reactions can be treated with medicine. Severe reactions usually need to be treated at a hospital. HOW CAN REACTIONS BE PREVENTED? The best way to prevent an allergic reaction is by avoiding the substance you are allergic to. Allergy shots and medicines can also help prevent reactions in some cases. People with severe allergic reactions may be able to prevent a life-threatening reaction called anaphylaxis with a medicine given right after exposure to the allergen.   This information is not intended to replace advice given to you by your health care provider. Make sure you discuss any questions you have with your health care provider.   Document Released: 12/02/2002 Document Revised: 09/29/2014 Document Reviewed: 06/20/2014 Elsevier Interactive Patient Education [employer].

## 2016-02-27 NOTE — Progress Notes (Signed)
    MRN: [id number] DOB: 04-Sep-1956  Subjective:   Amy Phillips is a 60 y.o. female presenting for chief complaint of URI; Nasal Congestion; Cough; and Medication Refill  Reports 4 day history of bronchospasms and wheezing at night, chest tightness, sore throat, hoarseness. She has used her QVAR, albuterol with some relief. She has been using her albuterol every 4 hours. She has used QVAR twice daily. Of note, patient spent significant time working outdoors. Admits strong history of hyper-reactivity to allergens. Denies fever, chest pain, sinus pain, ear pain. Patient uses doxycycline as needed for acne. She has not used this recently.   Amy Phillips has a current medication list which includes the following prescription(s): alprazolam, fluticasone, qvar, ventolin hfa, doxycycline, and valacyclovir. Also is allergic to peanuts; shellfish allergy; and strawberry (diagnostic).  Amy Phillips  has a past medical history of Allergy; Asthma; Anxiety; GERD (gastroesophageal reflux disease); Uterine fibroid (2006); Diverticulitis (08/05/2012); and Arrhythmia. Also  has past surgical history that includes Dilation and curettage of uterus; Dilatation & curettage/hysteroscopy with trueclear (N/A, 09/08/2013); Upper gi endoscopy (07/23/12); Abdominal hysterectomy (N/A, 06/12/2015); and Bilateral salpingectomy (06/12/2015).  Objective:   Vitals: BP 110/71 mmHg  Pulse 80  Temp(Src) 98.9 F (37.2 C) (Oral)  Resp 17  Ht 5' 3.5" (1.613 m)  Wt 135 lb (61.236 kg)  BMI 23.54 kg/m2  SpO2 98%  LMP  (LMP Unknown)  Physical Exam  Constitutional: She is oriented to person, place, and time. She appears well-developed and well-nourished.  HENT:  TM's intact bilaterally, no effusions or erythema. Nasal turbinates pink and moist, nasal passages patent. No sinus tenderness. Oropharynx clear, mucous membranes moist, dentition in good repair.  Eyes: Right eye exhibits no discharge. Left eye exhibits no discharge. No scleral icterus.   Neck: Normal range of motion. Neck supple.  Cardiovascular: Normal rate, regular rhythm and intact distal pulses.  Exam reveals no gallop and no friction rub.   No murmur heard. Pulmonary/Chest: No respiratory distress. She has wheezes (Right mid-lower base). She has no rales.  Lymphadenopathy:    She has no cervical adenopathy.  Neurological: She is alert and oriented to person, place, and time.  Skin: Skin is warm and dry.   Dg Chest 2 View  02/27/2016  CLINICAL DATA:  Four-day history of wheezing, chest tightness, hoarseness and nasal congestion, history of asthma EXAM: CHEST  2 VIEW COMPARISON:  None FINDINGS: No active infiltrate or effusion is seen. The lungs are slightly hyperaerated. Mediastinal and hilar contours are unremarkable. The heart is within normal limits in size. No bony abnormality is seen. IMPRESSION: No active cardiopulmonary disease.  Slight hyper aeration. Electronically Signed   By: Ivar Drape M.D.   On: 02/27/2016 17:27    Assessment and Plan :   1. Extrinsic asthma, mild persistent, uncomplicated 2. Chest tightness 3. Hoarseness of voice 4. Sore throat - Likely undergoing viral syndrome versus hyper-reactivity of her airway secondary to severe allergies. Patient will start steroid course, continue QVAR BID, albuterol 2-3 times daily. RTC in 1 week if no improvement.  Jaynee Eagles, PA-C [facility] [phone] 02/27/2016 4:47 PM

## 2016-02-29 ENCOUNTER — Telehealth: Payer: Self-pay

## 2016-02-29 DIAGNOSIS — J4540 Moderate persistent asthma, uncomplicated: Secondary | ICD-10-CM

## 2016-02-29 NOTE — Telephone Encounter (Signed)
Pt saw Amy Phillips recently and is still having some symptoms and would like to know what to do next still no fever cough has changed and she has a history of asthma and is needing to use her inhaler every 2 hours   If something needs to be called in please use the [facility] listed unless it is closed  Best number [phone]

## 2016-02-29 NOTE — Telephone Encounter (Signed)
Patient states that she is doing better since this morning. She does not have a nebulizer at home. Was using her albuterol inhaler every 2 hours. I advised not to use this less than 4 hours in between each use. I recommended she continue with the steroid course, cough syrup, QVAR as well. I will place an order for a nebulizer machine for patient. However, if she is not better, patient will call tomorrow, consider coming in for a nebulizer treatment and re-evaluation.

## 2016-03-03 ENCOUNTER — Telehealth: Payer: Self-pay

## 2016-03-03 ENCOUNTER — Other Ambulatory Visit: Payer: Self-pay | Admitting: Physician Assistant

## 2016-03-03 MED ORDER — ALBUTEROL SULFATE (2.5 MG/3ML) 0.083% IN NEBU: 2.5000 mg | INHALATION_SOLUTION | Freq: Four times a day (QID) | RESPIRATORY_TRACT | Status: DC | PRN

## 2016-03-03 NOTE — Telephone Encounter (Signed)
I happy to write for nebulizer, but for pred she will need another office visit.  No red flags with her last visit and vitals with regard to urgent need for more steroids. Philis Fendt, MS, PA-C 5:02 PM, 03/03/2016

## 2016-03-03 NOTE — Telephone Encounter (Signed)
Spoke with pt, she is checking on her nebulizer. I contacted Estill Bamberg at [facility] and advised her. She also wants to know if they will provide the medication for her to put in the machine or were we supposed to prescribe that. She also is asking for a refill on prednisone. I advised her to come back in to be seen. She said she will tomorrow if she is still feeling bad. Please advise.

## 2016-03-03 NOTE — Telephone Encounter (Signed)
Pt has questions about her inhaler and the albuteral and see if the predizone can get another refill  [facility] [phone]

## 2016-03-05 NOTE — Telephone Encounter (Signed)
SPoke with pt, advised message. Pt understood.

## 2016-03-05 NOTE — Telephone Encounter (Signed)
Left message for pt to call back  °

## 2016-03-06 NOTE — Telephone Encounter (Signed)
Amy Phillips [facility] has received orders and will process.

## 2016-03-11 ENCOUNTER — Ambulatory Visit (INDEPENDENT_AMBULATORY_CARE_PROVIDER_SITE_OTHER): Payer: 59 | Admitting: Family Medicine

## 2016-03-11 ENCOUNTER — Telehealth: Payer: Self-pay | Admitting: Family Medicine

## 2016-03-11 VITALS — BP 116/70 | HR 78 | Temp 98.1°F | Resp 18 | Ht 63.5 in | Wt 135.0 lb

## 2016-03-11 DIAGNOSIS — J4540 Moderate persistent asthma, uncomplicated: Secondary | ICD-10-CM | POA: Diagnosis not present

## 2016-03-11 MED ORDER — AZITHROMYCIN 250 MG PO TABS: ORAL_TABLET | ORAL | Status: DC

## 2016-03-11 MED ORDER — QVAR 80 MCG/ACT IN AERS: INHALATION_SPRAY | RESPIRATORY_TRACT | Status: DC

## 2016-03-11 MED ORDER — ALBUTEROL SULFATE (2.5 MG/3ML) 0.083% IN NEBU: 2.5000 mg | INHALATION_SOLUTION | RESPIRATORY_TRACT | Status: DC | PRN

## 2016-03-11 MED ORDER — PREDNISONE 20 MG PO TABS: ORAL_TABLET | ORAL | Status: DC

## 2016-03-11 MED ORDER — VALACYCLOVIR HCL 1 G PO TABS: 1000.0000 mg | ORAL_TABLET | Freq: Every day | ORAL | Status: AC

## 2016-03-11 MED ORDER — NYSTATIN 100000 UNIT/ML MT SUSP: 5.0000 mL | Freq: Four times a day (QID) | OROMUCOSAL | Status: DC

## 2016-03-11 MED ORDER — FLUTICASONE PROPIONATE 50 MCG/ACT NA SUSP: 2.0000 | NASAL | Status: DC | PRN

## 2016-03-11 MED FILL — NYSTATIN 100,000 UNITS/ML S: 100000 | 10 days supply | Qty: 200 | Fill #0

## 2016-03-11 MED FILL — AZITHROMYCIN 250 MG TABLET: 250 | 5 days supply | Qty: 6 | Fill #0

## 2016-03-11 MED FILL — valACYclovir HCL 1 GM TABS: 1 | 30 days supply | Qty: 30 | Fill #0

## 2016-03-11 MED FILL — predniSONE 20 MG TABS: 20 | 9 days supply | Qty: 18 | Fill #0

## 2016-03-11 MED FILL — QVAR 80 MCG ORAL INHALER: 80 | 30 days supply | Qty: 9 | Fill #0

## 2016-03-11 MED FILL — ALBUTEROL 0.083% INHAL SOLN: (2.5 MG/3ML | 2 days supply | Qty: 150 | Fill #0

## 2016-03-11 MED FILL — FLUTICASONE PROP 50 MCG SPR: 50 | 30 days supply | Qty: 16 | Fill #0

## 2016-03-11 NOTE — Telephone Encounter (Signed)
See last phone note - pt has still not been contacted by anyone about the nebulizer machine she was prescribed. Thanks.

## 2016-03-11 NOTE — Patient Instructions (Signed)
Thrush, Adult  Thrush, also called oral candidiasis, is a fungal infection that develops in the mouth and throat and on the tongue. It causes white patches to form on the mouth and tongue. Thrush is most common in older adults, but it can occur at any age.   Many cases of thrush are mild, but this infection can also be more serious. Thrush can be a recurring problem for people who have chronic illnesses or who take medicines that limit the body's ability to fight infection. Because these people have difficulty fighting infections, the fungus that causes thrush can spread throughout the body. This can cause life-threatening blood or organ infections.  CAUSES   Thrush is usually caused by a yeast called Candida albicans. This fungus is normally present in small amounts in the mouth and on other mucous membranes. It usually causes no harm. However, when conditions are present that allow the fungus to grow uncontrolled, it invades surrounding tissues and becomes an infection. Less often, other Candida species can also lead to thrush.   RISK FACTORS  Thrush is more likely to develop in the following people:  · People with an impaired ability to fight infection (weakened immune system).    · Older adults.    · People with HIV.    · People with diabetes.    · People with dry mouth (xerostomia).    · Pregnant women.    · People with poor dental care, especially those who have false teeth.    · People who use antibiotic medicines.    SIGNS AND SYMPTOMS   Thrush can be a mild infection that causes no symptoms. If symptoms develop, they may include:   · A burning feeling in the mouth and throat. This can occur at the start of a thrush infection.    · White patches that adhere to the mouth and tongue. The tissue around the patches may be red, raw, and painful. If rubbed (during tooth brushing, for example), the patches and the tissue of the mouth may bleed easily.    · A bad taste in the mouth or difficulty tasting foods.     · Cottony feeling in the mouth.    · Pain during eating and swallowing.  DIAGNOSIS   Your health care provider can usually diagnose thrush by looking in your mouth and asking you questions about your health.   TREATMENT   Medicines that help prevent the growth of fungi (antifungals) are the standard treatment for thrush. These medicines are either applied directly to the affected area (topical) or swallowed (oral). The treatment will depend on the severity of the condition.   Mild Thrush  Mild cases of thrush may clear up with the use of an antifungal mouth rinse or lozenges. Treatment usually lasts about 14 days.   Moderate to Severe Thrush  · More severe thrush infections that have spread to the esophagus are treated with an oral antifungal medicine. A topical antifungal medicine may also be used.    · For some severe infections, a treatment period longer than 14 days may be needed.    · Oral antifungal medicines are almost never used during pregnancy because the fetus may be harmed. However, if a pregnant woman has a rare, severe thrush infection that has spread to her blood, oral antifungal medicines may be used. In this case, the risk of harm to the mother and fetus from the severe thrush infection may be greater than the risk posed by the use of antifungal medicines.    Persistent or Recurrent Thrush  For cases of   thrush that do not go away or keep coming back, treatment may involve the following:   · Treatment may be needed twice as long as the symptoms last.    · Treatment will include both oral and topical antifungal medicines.    · People with weakened immune systems can take an antifungal medicine on a continuous basis to prevent thrush infections.    It is important to treat conditions that make you more likely to get thrush, such as diabetes or HIV.   HOME CARE INSTRUCTIONS   · Only take over-the-counter or prescription medicine as directed by your health care provider. Talk to your health care  provider about an over-the-counter medicine called gentian violet, which kills bacteria and fungi.    · Eat plain, unflavored yogurt as directed by your health care provider. Check the label to make sure the yogurt contains live cultures. This yogurt can help healthy bacteria grow in the mouth that can stop the growth of the fungus that causes thrush.    · Try these measures to help reduce the discomfort of thrush:      Drink cold liquids such as water or iced tea.      Try flavored ice treats or frozen juices.      Eat foods that are easy to swallow, such as gelatin, ice cream, or custard.      If the patches in your mouth are painful, try drinking from a straw.    · Rinse your mouth several times a day with a warm saltwater rinse. You can make the saltwater mixture with 1 tsp (6 g) of salt in 8 fl oz (0.2 L) of warm water.    · If you wear dentures, remove the dentures before going to bed, brush them vigorously, and soak them in a cleaning solution as directed by your health care provider.    · Women who are breastfeeding should clean their nipples with an antifungal medicine as directed by their health care provider. Dry the nipples after breastfeeding. Applying lanolin-containing body lotion may help relieve nipple soreness.    SEEK MEDICAL CARE IF:  · Your symptoms are getting worse or are not improving within 7 days of starting treatment.    · You have symptoms of spreading infection, such as white patches on the skin outside of the mouth.    · You are nursing and you have redness, burning, or pain in the nipples that is not relieved with treatment.    MAKE SURE YOU:  · Understand these instructions.  · Will watch your condition.  · Will get help right away if you are not doing well or get worse.     This information is not intended to replace advice given to you by your health care provider. Make sure you discuss any questions you have with your health care provider.     Document Released: 06/03/2004 Document  Revised: 09/29/2014 Document Reviewed: 04/11/2013  Elsevier Interactive Patient Education ©2016 [employer]

## 2016-03-11 NOTE — Progress Notes (Signed)
By signing my name below, I, Mesha Guinyard, attest that this documentation has been prepared under the direction and in the presence of Delman Cheadle, MD.  Electronically Signed: Verlee Monte, Medical Scribe. 03/11/2016. 11:50 AM.  Subjective:    Patient ID: Amy Phillips, female    DOB: 04-22-1956, 60 y.o.   MRN: [id number]  HPI Chief Complaint  Patient presents with  . Follow-up    sore throat/sob  . Medication Refill    valtrex   12/29 HPI Comments: Amy Phillips is a 60 y.o. female who presents to the [facility] for a follow-up. Pt has asthma, controlled on Qvar, Flonase and Loratadine daily. Pt was seen here 2 weeks prior by [facility]. She was wheezing, and her chest X-Ray was clear- suspected viral vs asthma flare from allergies so placed on Prednisone 40 mg QD x5 days. 2 days into the steroid course she wasn't having significant improve she was using her nebulizer so she was ordered a nebulizer machine. How ever feb machine orders where not processed until a week after her initial call. She was given a round of 7 days of Augmentin 3 months ago for sinusitis- which has been her only abx in 3 months.  Pt's symptoms have improved at first but it began to plateau. Pt's voice came back 24 hours after starting the prednisone, but she'll lose it in the evenings. Pt mentions SOB, coughing, sore throat, hoarse voice, and looses her voice in the evening. Pt states it feels like she has strep throat. Pt has matting in her eyes when she wakes up. Pt uses eye drops with little relif to her symptoms. Pts ears feels muffled, like there's water inside them. Pt has bronchial spasms and uses her inhaler for relief. Pt uses chloraseptic spray to relieve her sore throat. Pt states the Codene has relieved her sore throat with little relief to her sympyoms. Pt is still using Albuterol, Flonase about every 6 hours, and Qvar for her symptoms. Pt still hasn't got her nebulizer. Pt has taken ZPack in  the past and it normally knocks out what she has.  Pt has outbreak on her lip and would like to get a refill on Valtrex.  Pt is supposed to fly out to [place] tomorrow. Pt would like to switch to Dr. Brigitte Pulse as her PCP.  Patient Active Problem List   Diagnosis Date Noted  . Fibroid, uterine 06/12/2015  . Leiomyoma of uterus, unspecified 12/29/2014  . Abnormal uterine bleeding 12/29/2014  . Enlarged uterus 07/28/2014  . Fibroids 05/24/2013  . Post-menopausal bleeding 05/24/2013  . Diverticulosis 07/27/2012  . Colon polyps 07/27/2012  . HYPERLIPIDEMIA-MIXED 10/04/2010  . Palpitations 10/04/2010  . DYSPNEA 10/04/2010  . DYSPNEA ON EXERTION 10/04/2010   Past Medical History  Diagnosis Date  . Allergy   . Asthma   . Anxiety   . GERD (gastroesophageal reflux disease)   . Uterine fibroid 2006    16 week size  . Diverticulitis 08/05/2012    Dr Collene Mares  . 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 Dilation and curettage of uterus      D&E x2 missed ab  . Dilatation & curettage/hysteroscopy with trueclear N/A 09/08/2013    Procedure: DILATATION & CURETTAGE/HYSTEROSCOPY WITH TRUCLEAR;  Surgeon: Azalia Bilis, MD;  Location: [facility];  Service: Gynecology;  Laterality: N/A;  . Upper gi endoscopy  07/23/12    grade  1 esophagitis noted:  otherwise normal esophagogastrduodenoscopy  . Abdominal hysterectomy N/A 06/12/2015    Procedure: HYSTERECTOMY ABDOMINAL With Vertical Incision;  Surgeon: Eldred Manges, MD;  Location: [facility];  Service: Gynecology;  Laterality: N/A;  . Bilateral salpingectomy  06/12/2015    Procedure: BILATERAL SALPINGECTOMY;  Surgeon: Eldred Manges, MD;  Location: [facility];  Service: Gynecology;;   Allergies  Allergen Reactions  . Peanuts [Peanut Oil] Other (See Comments)    Mood changes per pt and husband   . Shellfish Allergy Other (See Comments)    Throat irritation  . Strawberry (Diagnostic)    Prior to Admission  medications   Medication Sig Start Date End Date Taking? Authorizing Provider  albuterol (PROVENTIL) (2.5 MG/3ML) 0.083% nebulizer solution Take 3 mLs (2.5 mg total) by nebulization every 6 (six) hours as needed for wheezing or shortness of breath. 03/03/16  Yes Tereasa Coop, PA-C  ALPRAZolam Duanne Moron) 0.25 MG tablet Take 1 tablet (0.25 mg total) by mouth at bedtime as needed for anxiety or sleep. 09/20/15  Yes Shawnee Knapp, MD  fluticasone (FLONASE) 50 MCG/ACT nasal spray Place 2 sprays into both nostrils as needed for allergies. 09/20/15  Yes Shawnee Knapp, MD  HYDROcodone-homatropine [facility]) 5-1.5 MG/5ML syrup Take 5 mLs by mouth every 8 (eight) hours as needed. 02/27/16  Yes Jaynee Eagles, PA-C  QVAR 80 MCG/ACT inhaler Inhale 2 puff into the lungs bid as directed. 09/20/15  Yes Shawnee Knapp, MD  VENTOLIN HFA 108 [phone] Base) MCG/ACT inhaler Inhale 2 puffs every 4-6 hrs as needed. 09/20/15  Yes Shawnee Knapp, MD   Social History   Social History  . Marital Status: Married    Spouse Name: N/A  . Number of Children: N/A  . Years of Education: N/A   Occupational History  . Not on file.   Social History Main Topics  . Smoking status: Never Smoker   . Smokeless tobacco: Not on file  . Alcohol Use: No  . Drug Use: No  . Sexual Activity:    Partners: Male    Birth Control/ Protection: None   Other Topics Concern  . Not on file   Social History Narrative   Depression screen [facility] 2/9 03/11/2016 09/20/2015 06/01/2015 03/01/2015  Decreased Interest 0 0 0 0  Down, Depressed, Hopeless 0 0 0 0  PHQ - 2 Score 0 0 0 0   Review of Systems  Constitutional: Negative for fever.  HENT: Positive for ear pain, sore throat and voice change. Negative for postnasal drip, rhinorrhea and sinus pressure.   Eyes: Positive for discharge.  Respiratory: Positive for cough and shortness of breath. Negative for wheezing.    Objective:  BP 116/70 mmHg  Pulse 78  Temp(Src) 98.1 F (36.7 C) (Oral)  Resp 18  Ht 5' 3.5"  (1.613 m)  Wt 135 lb (61.236 kg)  BMI 23.54 kg/m2  SpO2 99%  LMP  (LMP Unknown)  Physical Exam  Constitutional: She appears well-developed and well-nourished. No distress.  HENT:  Head: Normocephalic and atraumatic.  Right Ear: Tympanic membrane is injected and retracted.  Left Ear: Tympanic membrane normal.  Mouth/Throat: Posterior oropharyngeal erythema present.  Nares edematous and erythematous Tongue had a thick white coating that was not able to be scrapped off.  Eyes: Conjunctivae are normal.  Neck: Neck supple.  Cardiovascular: Normal rate, regular rhythm, S1 normal and S2 normal.   Pulmonary/Chest: Effort normal and breath sounds normal. No respiratory distress. She has no wheezes.  She has no rales.  Lymphadenopathy:       Head (right side): Tonsillar adenopathy present.       Head (left side): Tonsillar adenopathy present.    She has no cervical adenopathy.       Right cervical: No superficial cervical adenopathy present.      Left cervical: No superficial cervical adenopathy present.  No anterior cervical adenopathy Bilateral proximal supracalvicular fullness- pt states she always has it in the summer  Neurological: She is alert.  Skin: Skin is warm and dry.  Psychiatric: She has a normal mood and affect. Her behavior is normal.  Nursing note and vitals reviewed.  Assessment & Plan:  Okay to refill Xanac x3 months when ever needed 1. Asthma, moderate persistent, uncomplicated   Suspect thrush is causing her severe pharyngitis. Pt leaving for [place] in 2d to fly with her 67 yo mother out to see family so will be aggressive in treatment  Orders Placed This Encounter  Procedures  . Care order/instruction    AVS and GO    Scheduling Instructions:     AVS and GO    Meds ordered this encounter  Medications  . predniSONE (DELTASONE) 20 MG tablet    Sig: Take 3 tabs qd x 3d, then 2 tabs qd x 3d then 1 tab qd x 3d.    Dispense:  18 tablet    Refill:  0  .  azithromycin (ZITHROMAX) 250 MG tablet    Sig: Take 2 tabs PO x 1 dose, then 1 tab PO QD x 4 days    Dispense:  6 tablet    Refill:  0  . nystatin (MYCOSTATIN) 100000 UNIT/ML suspension    Sig: Take 5 mLs (500,000 Units total) by mouth 4 (four) times daily. Swish mouth for at least 30 sec before swallowing. Stop 2d after sxs resolve    Dispense:  200 mL    Refill:  1  . albuterol (PROVENTIL) (2.5 MG/3ML) 0.083% nebulizer solution    Sig: Take 3 mLs (2.5 mg total) by nebulization every 2 (two) hours as needed for wheezing or shortness of breath.    Dispense:  150 mL    Refill:  2  . fluticasone (FLONASE) 50 MCG/ACT nasal spray    Sig: Place 2 sprays into both nostrils as needed for allergies.    Dispense:  16 g    Refill:  5  . QVAR 80 MCG/ACT inhaler    Sig: Inhale 2 puff into the lungs bid as directed.    Dispense:  8.7 g    Refill:  5  . valACYclovir (VALTREX) 1000 MG tablet    Sig: Take 1 tablet (1,000 mg total) by mouth daily. For 5 days at first sign of outbreak    Dispense:  30 tablet    Refill:  5    I personally performed the services described in this documentation, which was scribed in my presence. The recorded information has been reviewed and considered, and addended by me as needed.   Delman Cheadle, M.D.  [facility] [address] [place], [place] [address] [phone] phone [phone] fax  03/14/2016 9:14 AM

## 2016-03-12 NOTE — Telephone Encounter (Signed)
I faxed the information to [facility] in [place].

## 2016-03-20 MED FILL — VENTOLIN HFA 90 MCG INHALER: 108 (90 BAS | 17 days supply | Qty: 18 | Fill #1

## 2016-04-28 ENCOUNTER — Telehealth: Payer: 59 | Admitting: Nurse Practitioner

## 2016-04-28 DIAGNOSIS — L237 Allergic contact dermatitis due to plants, except food: Secondary | ICD-10-CM | POA: Diagnosis not present

## 2016-04-28 MED ORDER — METHYLPREDNISOLONE 4 MG PO TBPK: ORAL_TABLET | ORAL | 0 refills | Status: DC

## 2016-04-28 MED FILL — METHYLPREDNISOLONE 4 MG TAB: 4 | 6 days supply | Qty: 21 | Fill #0

## 2016-04-28 NOTE — Progress Notes (Signed)

## 2016-07-08 MED FILL — QVAR 80 MCG ORAL INHALER: 80 | 30 days supply | Qty: 9 | Fill #1

## 2016-07-08 MED FILL — ALBUTEROL 0.083% INHAL SOLN: (2.5 MG/3ML | 15 days supply | Qty: 150 | Fill #1

## 2016-08-12 ENCOUNTER — Other Ambulatory Visit: Payer: Self-pay | Admitting: Family Medicine

## 2016-08-12 DIAGNOSIS — F419 Anxiety disorder, unspecified: Secondary | ICD-10-CM

## 2016-08-12 MED FILL — VENTOLIN HFA 90 MCG INHALER: 108 (90 BAS | 30 days supply | Qty: 18 | Fill #1

## 2016-08-12 MED FILL — ESTRACE 0.01% CREAM: 0.1 | 90 days supply | Qty: 43 | Fill #1

## 2016-08-13 MED FILL — QVAR 80 MCG ORAL INHALER: 80 | 30 days supply | Qty: 9 | Fill #2

## 2016-08-15 NOTE — Telephone Encounter (Signed)
Last seen for anxiety 09/20/15 and given #30 + 2 RFs then. Seen in June but not for anxiety.

## 2016-08-19 MED FILL — ALPRAZolam 0.25 MG TABS: 0.25 | 30 days supply | Qty: 30 | Fill #0

## 2016-10-02 ENCOUNTER — Encounter: Payer: Self-pay | Admitting: Family Medicine

## 2016-10-02 ENCOUNTER — Ambulatory Visit (INDEPENDENT_AMBULATORY_CARE_PROVIDER_SITE_OTHER): Payer: 59 | Admitting: Family Medicine

## 2016-10-02 VITALS — BP 132/82 | HR 109 | Temp 98.7°F | Resp 18 | Ht 63.5 in | Wt 141.0 lb

## 2016-10-02 DIAGNOSIS — R000 Tachycardia, unspecified: Secondary | ICD-10-CM | POA: Diagnosis not present

## 2016-10-02 DIAGNOSIS — E875 Hyperkalemia: Secondary | ICD-10-CM

## 2016-10-02 DIAGNOSIS — R609 Edema, unspecified: Secondary | ICD-10-CM

## 2016-10-02 MED ORDER — PROPRANOLOL HCL 40 MG PO TABS: 40.0000 mg | ORAL_TABLET | Freq: Four times a day (QID) | ORAL | 0 refills | Status: DC | PRN

## 2016-10-02 MED ORDER — FUROSEMIDE 20 MG PO TABS: 20.0000 mg | ORAL_TABLET | Freq: Every day | ORAL | 1 refills | Status: DC

## 2016-10-02 NOTE — Progress Notes (Signed)
Subjective:    Patient ID: [name], female    DOB: 08-Apr-1956, 61 y.o.   MRN: [id number] Chief Complaint  Patient presents with  . Irregular Heart Beat    HPI  Had 4 mo episode of bronchitis this summer - gained weight with this as hard to exercise when coughing a lot. Feels thumping in her chest and feels herself skipping beats frequently.  Always had fluttering but worsened since sudafed in Nov, constant in last few wks. 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  unwarrented anxiety which she has had a lot prev Thinks xanax does decrease the palpitations.   She switched to decaf coffee about 10d ago and does not get other cafeine in her diet No other supp. Not tried anything else to decrease the symptoms.  Poor sleep but that is long-standing. Xanax helps sometimes.  lexapro and paxil made her so nauseas she thought she had the flu.  She has not needed to use her albuterol since Nov and actually has not needed the qvar either.  Not exercised.  She would like to restart because of the weight gain but wants to get medically cleared first due to the new palpitations.  Had severe vaginal bleeding for years from fibroids that worsened by exercise.  Now resolved.  Was having severe reflux so started nexium Gained a lot of water weight from prednisone coursed during the bronchitis  Estrogen imbalance has caused a lot of anxiety prior.   Was on the vivelle dot prior and now changed to vaginal supp estrace.  Went through menopause since 2012. Hysterectomy 05/2015. Went through menopause at 32 and started on the vivelle.  Then several years ago was switched to the vaginal estrace but doesn't feel like it helps near as well.   Depression screen [facility] 2/9 03/11/2016 09/20/2015 06/01/2015 03/01/2015  Decreased Interest 0 0 0 0  Down,  Depressed, Hopeless 0 0 0 0  PHQ - 2 Score 0 0 0 0   Past Medical History:  Diagnosis Date  . Allergy   . Anxiety   . Arrhythmia    PVCs- no meds  . Asthma   . Diverticulitis 08/05/2012   Dr Collene Mares  . GERD (gastroesophageal reflux disease)   . Uterine fibroid 2006   16 week size   Past Surgical History:  Procedure Laterality Date  . ABDOMINAL HYSTERECTOMY N/A 06/12/2015   Procedure: HYSTERECTOMY ABDOMINAL With Vertical Incision;  Surgeon: Eldred Manges, MD;  Location: [facility];  Service: Gynecology;  Laterality: N/A;  . BILATERAL SALPINGECTOMY  06/12/2015   Procedure: BILATERAL SALPINGECTOMY;  Surgeon: Eldred Manges, MD;  Location: [facility];  Service: Gynecology;;  . DILATATION & CURETTAGE/HYSTEROSCOPY WITH TRUECLEAR N/A 09/08/2013   Procedure: DILATATION & CURETTAGE/HYSTEROSCOPY WITH TRUCLEAR;  Surgeon: Azalia Bilis, MD;  Location: [facility];  Service: Gynecology;  Laterality: N/A;  . DILATION AND CURETTAGE OF UTERUS     D&E x2 missed ab  . UPPER GI ENDOSCOPY  07/23/12   grade 1 esophagitis noted:  otherwise normal esophagogastrduodenoscopy   Current Outpatient Prescriptions on File Prior to Visit  Medication Sig Dispense Refill  . albuterol (PROVENTIL) (2.5 MG/3ML) 0.083% nebulizer solution Take 3 mLs (2.5 mg total) by nebulization every 2 (two) hours as needed for wheezing or shortness of breath. 150 mL 2  . ALPRAZolam Duanne Moron)  0.25 MG tablet TAKE 1 TABLET BY MOUTH ONCE DAILY AT BEDTIME AS NEEDED FOR ANXIETY OR SLEEP 30 tablet 2  . fluticasone (FLONASE) 50 MCG/ACT nasal spray Place 2 sprays into both nostrils as needed for allergies. 16 g 5  . methylPREDNISolone (MEDROL DOSEPAK) 4 MG TBPK tablet Take as directed 21 tablet 0  . nystatin (MYCOSTATIN) 100000 UNIT/ML suspension Take 5 mLs (500,000 Units total) by mouth 4 (four) times daily. Swish mouth for at least 30 sec before swallowing. Stop 2d after sxs resolve 200 mL 1  . QVAR 80 MCG/ACT inhaler Inhale 2 puff into the lungs  bid as directed. 8.7 g 5  . valACYclovir (VALTREX) 1000 MG tablet Take 1 tablet (1,000 mg total) by mouth daily. For 5 days at first sign of outbreak 30 tablet 5  . VENTOLIN HFA 108 (90 Base) MCG/ACT inhaler Inhale 2 puffs every 4-6 hrs as needed. 18 g 11   No current facility-administered medications on file prior to visit.    Allergies  Allergen Reactions  . Peanuts [Peanut Oil] Other (See Comments)    Mood changes per pt and husband   . Shellfish Allergy Other (See Comments)    Throat irritation  . Strawberry (Diagnostic)    Family History  Problem Relation Age of Onset  . Arthritis Mother   . Osteoporosis Mother   . Breast cancer Mother   . Cancer Mother     lung  . COPD Mother   . Cancer Father     lung  . COPD Father   . Heart disease Father   . Hypertension Sister   . Heart disease Sister   . Arthritis Sister   . Hypertension Brother    Social History   Social History  . Marital status: Married    Spouse name: N/A  . Number of children: N/A  . Years of education: N/A   Social History Main Topics  . Smoking status: Never Smoker  . Smokeless tobacco: Never Used  . Alcohol use No  . Drug use: No  . Sexual activity: Yes    Partners: Male    Birth control/ protection: None   Other Topics Concern  . None   Social History Narrative  . None     Review of Systems  Constitutional: Positive for activity change (decrease), fatigue and unexpected weight change (gained). Negative for appetite change, chills, diaphoresis and fever.  Eyes: Negative for visual disturbance.  Respiratory: Negative for cough and shortness of breath.   Cardiovascular: Positive for palpitations and leg swelling. Negative for chest pain.  Genitourinary: Negative for decreased urine volume.  Neurological: Negative for syncope and headaches.  Hematological: Does not bruise/bleed easily.  Psychiatric/Behavioral: Positive for sleep disturbance. The patient is nervous/anxious.          Objective:   Physical Exam  Constitutional: She is oriented to person, place, and time. She appears well-developed and well-nourished. No distress.  HENT:  Head: Normocephalic and atraumatic.  Right Ear: External ear normal.  Left Ear: External ear normal.  Eyes: Conjunctivae are normal. No scleral icterus.  Neck: Normal range of motion. Neck supple. No thyromegaly present.  Cardiovascular: S1 normal, S2 normal, normal heart sounds and intact distal pulses.  An irregular rhythm present. Tachycardia present.   No murmur heard. Pulmonary/Chest: Effort normal and breath sounds normal. No respiratory distress.  Musculoskeletal: She exhibits no edema.  Lymphadenopathy:    She has no cervical adenopathy.  Neurological: She is alert and oriented to person,  place, and time.  Skin: Skin is warm and dry. She is not diaphoretic. No erythema.  Psychiatric: She has a normal mood and affect. Her behavior is normal.         BP 132/82 (BP Location: Right Arm, Patient Position: Sitting, Cuff Size: Small)   Pulse (!) 109   Temp 98.7 F (37.1 C) (Oral)   Resp 18   Ht 5' 3.5" (1.613 m)   Wt 141 lb (64 kg)   LMP  (LMP Unknown) Comment: AUB  SpO2 97%   BMI 24.59 kg/m  [facility] reading (PRIMARY) by  Dr. Brigitte Pulse. EKG: Sinus tachycardia in low 100s with freq PACs and PVC.  Orthostatic VS negative - BP did drop and HR did increase but not be enough to be significant nor was pt symptomatic. Assessment & Plan:   1. Tachycardia - pt had similar episode of palpable PACs/PVCs in 2012 seen by Dr. Harrington Challenger with Holter monitor that was normal - pulse in 80s.  It is odd that her baseline rate has changed significantly.  Try checking pulse outside of office.  Pt reports that she was told not to use a BB at her prior cardiology eval but not sure why (poss because holter monitor shows her rate in the 80s most of the time) so see how she responds to propranolol since it is short acting and will hopefully help with some of  her mild anxiety as well which may be exacerbating sxs.  If tachycardia persist, refer back to cards.  Ok to use prn alprazolam to treat if needed - pt will not increase > qhs.  2. Edema, unspecified type - ok to try low dose lasix prn - increase K in diet with lasix - pt aware that it may make tachycardia worse in which case she will stop it  3. Hyperkalemia - suspect hemolysis, recheck    Orders Placed This Encounter  Procedures  . CBC  . TSH  . Comprehensive metabolic panel  . Potassium    Standing Status:   Future    Standing Expiration Date:   10/04/2017  . Orthostatic vital signs  . EKG 12-Lead    Meds ordered this encounter  Medications  . doxycycline (VIBRA-TABS) 100 MG tablet    Sig: Take 100 mg by mouth 2 (two) times daily.  . furosemide (LASIX) 20 MG tablet    Sig: Take 1 tablet (20 mg total) by mouth daily.    Dispense:  30 tablet    Refill:  1  . propranolol (INDERAL) 40 MG tablet    Sig: Take 1 tablet (40 mg total) by mouth 4 (four) times daily as needed.    Dispense:  30 tablet    Refill:  0   Over 40 min spent in face-to-face evaluation of and consultation with patient and coordination of care.  Over 50% of this time was spent counseling this patient.   Delman Cheadle, M.D.  [facility] [address] [place], [place] [address] [phone] phone [phone] fax  10/04/16 11:17 PM

## 2016-10-02 NOTE — Patient Instructions (Signed)
Potassium Content of Foods Potassium is a mineral found in many foods and drinks. It helps keep fluids and minerals balanced in your body and affects how steadily your heart beats. Potassium also helps control your blood pressure and keep your muscles and nervous system healthy. Certain health conditions and medicines may change the balance of potassium in your body. When this happens, you can help balance your level of potassium through the foods that you do or do not eat. Your health care provider or dietitian may recommend an amount of potassium that you should have each day. The following lists of foods provide the amount of potassium (in parentheses) per serving in each item. High in potassium The following foods and beverages have 200 mg or more of potassium per serving:  Apricots, 2 raw or 5 dry (200 mg).  Artichoke, 1 medium (345 mg).  Avocado, raw,  each (245 mg).  Banana, 1 medium (425 mg).  Beans, lima, or baked beans, canned,  cup (280 mg).  Beans, white, canned,  cup (595 mg).  Beef roast, 3 oz (320 mg).  Beef, ground, 3 oz (270 mg).  Beets, raw or cooked,  cup (260 mg).  Bran muffin, 2 oz (300 mg).  Broccoli,  cup (230 mg).  Brussels sprouts,  cup (250 mg).  Cantaloupe,  cup (215 mg).  Cereal, 100% bran,  cup (200-400 mg).  Cheeseburger, single, fast food, 1 each (225-400 mg).  Chicken, 3 oz (220 mg).  Clams, canned, 3 oz (535 mg).  Crab, 3 oz (225 mg).  Dates, 5 each (270 mg).  Dried beans and peas,  cup (300-475 mg).  Figs, dried, 2 each (260 mg).  Fish: halibut, tuna, cod, snapper, 3 oz (480 mg).  Fish: salmon, haddock, swordfish, perch, 3 oz (300 mg).  Fish, tuna, canned 3 oz (200 mg).  [place] fries, fast food, 3 oz (470 mg).  Granola with fruit and nuts,  cup (200 mg).  Grapefruit juice,  cup (200 mg).  Greens, beet,  cup (655 mg).  Honeydew melon,  cup (200 mg).  Kale, raw, 1 cup (300 mg).  Kiwi, 1 medium (240  mg).  Kohlrabi, rutabaga, parsnips,  cup (280 mg).  Lentils,  cup (365 mg).  Mango, 1 each (325 mg).  Milk, chocolate, 1 cup (420 mg).  Milk: nonfat, low-fat, whole, buttermilk, 1 cup (350-380 mg).  Molasses, 1 Tbsp (295 mg).  Mushrooms,  cup (280) mg.  Nectarine, 1 each (275 mg).  Nuts: almonds, peanuts, hazelnuts, [place], cashew, mixed, 1 oz (200 mg).  Nuts, pistachios, 1 oz (295 mg).  Orange, 1 each (240 mg).  Orange juice,  cup (235 mg).  Papaya, medium,  fruit (390 mg).  Peanut butter, chunky, 2 Tbsp (240 mg).  Peanut butter, smooth, 2 Tbsp (210 mg).  Pear, 1 medium (200 mg).  Pomegranate, 1 whole (400 mg).  Pomegranate juice,  cup (215 mg).  Pork, 3 oz (350 mg).  Potato chips, salted, 1 oz (465 mg).  Potato, baked with skin, 1 medium (925 mg).  Potatoes, boiled,  cup (255 mg).  Potatoes, mashed,  cup (330 mg).  Prune juice,  cup (370 mg).  Prunes, 5 each (305 mg).  Pudding, chocolate,  cup (230 mg).  Pumpkin, canned,  cup (250 mg).  Raisins, seedless,  cup (270 mg).  Seeds, sunflower or pumpkin, 1 oz (240 mg).  Soy milk, 1 cup (300 mg).  Spinach,  cup (420 mg).  Spinach, canned,  cup (370 mg).  Sweet  potato, baked with skin, 1 medium (450 mg).  Swiss chard,  cup (480 mg).  Tomato or vegetable juice,  cup (275 mg).  Tomato sauce or puree,  cup (400-550 mg).  Tomato, raw, 1 medium (290 mg).  Tomatoes, canned,  cup (200-300 mg).  Turkey, 3 oz (250 mg).  Wheat germ, 1 oz (250 mg).  Winter squash,  cup (250 mg).  Yogurt, plain or fruited, 6 oz (260-435 mg).  Zucchini,  cup (220 mg). Moderate in potassium The following foods and beverages have 50-200 mg of potassium per serving:  Apple, 1 each (150 mg).  Apple juice,  cup (150 mg).  Applesauce,  cup (90 mg).  Apricot nectar,  cup (140 mg).  Asparagus, small spears,  cup or 6 spears (155 mg).  Bagel, cinnamon raisin, 1 each (130 mg).  Bagel,  egg or plain, 4 in., 1 each (70 mg).  Beans, green,  cup (90 mg).  Beans, yellow,  cup (190 mg).  Beer, regular, 12 oz (100 mg).  Beets, canned,  cup (125 mg).  Blackberries,  cup (115 mg).  Blueberries,  cup (60 mg).  Bread, whole wheat, 1 slice (70 mg).  Broccoli, raw,  cup (145 mg).  Cabbage,  cup (150 mg).  Carrots, cooked or raw,  cup (180 mg).  Cauliflower, raw,  cup (150 mg).  Celery, raw,  cup (155 mg).  Cereal, bran flakes, cup (120-150 mg).  Cheese, cottage,  cup (110 mg).  Cherries, 10 each (150 mg).  Chocolate, 1 oz bar (165 mg).  Coffee, brewed 6 oz (90 mg).  Corn,  cup or 1 ear (195 mg).  Cucumbers,  cup (80 mg).  Egg, large, 1 each (60 mg).  Eggplant,  cup (60 mg).  Endive, raw, cup (80 mg).  English muffin, 1 each (65 mg).  Fish, orange roughy, 3 oz (150 mg).  Frankfurter, beef or pork, 1 each (75 mg).  Fruit cocktail,  cup (115 mg).  Grape juice,  cup (170 mg).  Grapefruit,  fruit (175 mg).  Grapes,  cup (155 mg).  Greens: kale, turnip, collard,  cup (110-150 mg).  Ice cream or frozen yogurt, chocolate,  cup (175 mg).  Ice cream or frozen yogurt, vanilla,  cup (120-150 mg).  Lemons, limes, 1 each (80 mg).  Lettuce, all types, 1 cup (100 mg).  Mixed vegetables,  cup (150 mg).  Mushrooms, raw,  cup (110 mg).  Nuts: walnuts, pecans, or macadamia, 1 oz (125 mg).  Oatmeal,  cup (80 mg).  Okra,  cup (110 mg).  Onions, raw,  cup (120 mg).  Peach, 1 each (185 mg).  Peaches, canned,  cup (120 mg).  Pears, canned,  cup (120 mg).  Peas, green, frozen,  cup (90 mg).  Peppers, green,  cup (130 mg).  Peppers, red,  cup (160 mg).  Pineapple juice,  cup (165 mg).  Pineapple, fresh or canned,  cup (100 mg).  Plums, 1 each (105 mg).  Pudding, vanilla,  cup (150 mg).  Raspberries,  cup (90 mg).  Rhubarb,  cup (115 mg).  Rice, wild,  cup (80 mg).  Shrimp, 3 oz (155  mg).  Spinach, raw, 1 cup (170 mg).  Strawberries,  cup (125 mg).  Summer squash  cup (175-200 mg).  Swiss chard, raw, 1 cup (135 mg).  Tangerines, 1 each (140 mg).  Tea, brewed, 6 oz (65 mg).  Turnips,  cup (140 mg).  Watermelon,  cup (85 mg).  Wine, red, table, 5   oz (180 mg).  Wine, white, table, 5 oz (100 mg). Low in potassium The following foods and beverages have less than 50 mg of potassium per serving.  Bread, white, 1 slice (30 mg).  Carbonated beverages, 12 oz (less than 5 mg).  Cheese, 1 oz (20-30 mg).  Cranberries,  cup (45 mg).  Cranberry juice cocktail,  cup (20 mg).  Fats and oils, 1 Tbsp (less than 5 mg).  Hummus, 1 Tbsp (32 mg).  Nectar: papaya, mango, or pear,  cup (35 mg).  Rice, white or brown,  cup (50 mg).  Spaghetti or macaroni,  cup cooked (30 mg).  Tortilla, flour or corn, 1 each (50 mg).  Waffle, 4 in., 1 each (50 mg).  Water chestnuts,  cup (40 mg). This information is not intended to replace advice given to you by your health care provider. Make sure you discuss any questions you have with your health care provider. Document Released: 04/22/2005 Document Revised: 02/14/2016 Document Reviewed: 08/05/2013 Elsevier Interactive Patient Education  2017 [employer]  Sinus Tachycardia Sinus tachycardia is a kind of fast heartbeat. In sinus tachycardia, the heart beats more than 100 times a minute. Sinus tachycardia starts in a part of the heart called the sinus node. Sinus tachycardia may be harmless, or it may be a sign of a serious condition. What are the causes? This condition may be caused by:  Exercise or exertion.  A fever.  Pain.  Loss of body fluids (dehydration).  Severe bleeding (hemorrhage).  Anxiety and stress.  Certain substances, including:  Alcohol.  Caffeine.  Tobacco and nicotine products.  Diet pills.  Illegal drugs.  Medical conditions including:  Heart disease.  An  infection.  An overactive thyroid (hyperthyroidism).  A lack of red blood cells (anemia). What are the signs or symptoms? Symptoms of this condition include:  A feeling that the heart is beating quickly (palpitations).  Suddenly noticing your heartbeat (cardiac awareness).  Dizziness.  Tiredness (fatigue).  Shortness of breath.  Chest pain.  Nausea.  Fainting. How is this diagnosed? This condition is diagnosed with:  A physical exam.  Other tests, such as:  Blood tests.  An electrocardiogram (ECG). This test measures the electrical activity of the heart.  Holter monitoring. For this test, you wear a device that records your heartbeat for one or more days. You may be referred to a heart specialist (cardiologist). How is this treated? Treatment for this condition depends on the cause or underlying condition. Treatment may involve:  Treating the underlying condition.  Taking new medicines or changing your current medicines as told by your health care provider.  Making changes to your diet or lifestyle.  Practicing relaxation methods. Follow these instructions at home: Lifestyle  Do not use any products that contain nicotine or tobacco, such as cigarettes and e-cigarettes. If you need help quitting, ask your health care provider.  Learn relaxation methods, like deep breathing, to help you when you get stressed or anxious.  Do not use illegal drugs, such as cocaine.  Do not abuse alcohol. Limit alcohol intake to no more than 1 drink a day for non-pregnant women and 2 drinks a day for men. One drink equals 12 oz of beer, 5 oz of wine, or 1 oz of hard liquor.  Find time to rest and relax often. This reduces stress.  Avoid:  Caffeine.  Stimulants such as over-the-counter diet pills or pills that help you to stay awake.  Situations that cause anxiety or stress. General  instructions  Drink enough fluids to keep your urine clear or pale yellow.  Take  over-the-counter and prescription medicines only as told by your health care provider.  Keep all follow-up visits as told by your health care provider. This is important. Contact a health care provider if:  You have a fever.  You have vomiting or diarrhea that keeps happening (is persistent). Get help right away if:  You have pain in your chest, upper arms, jaw, or neck.  You become weak or dizzy.  You feel faint.  You have palpitations that do not go away. This information is not intended to replace advice given to you by your health care provider. Make sure you discuss any questions you have with your health care provider. Document Released: 10/16/2004 Document Revised: 04/05/2016 Document Reviewed: 03/23/2015 Elsevier Interactive Patient Education  2017 [employer] Inc.  Premature Atrial Contraction A premature atrial contraction [facility]) is a kind of irregular heartbeat (arrhythmia). It happens when the heart beats too early and then pauses before beating again. PACs are also called skipped heartbeats because they may make you feel like your heart is stopping for a second, even though the heart does not actually skip a beat. The heart has four areas, or chambers. Normally, electrical signals spread across the heart and make all the chambers beat together. During a PAC, the upper chambers of the heart (right atrium and left atrium) beat too early, before they have had time to fill with blood. The heartbeat pauses afterward so the heart can fill with blood for the next beat. What are the causes? The cause of this condition is often unknown. Sometimes it is caused by heart disease or injury to the heart. What increases the risk? This condition is more likely to develop in adults who are 37 years of age or older and in children. Episodes may be triggered by:  Caffeine.  Stress.  Tiredness.  Alcohol.  Smoking.  Stimulant drugs.  Heart disease. What are the signs or  symptoms? Symptoms of this condition include:  A feeling that your heart skipped a beat. The first heartbeat after the "skipped" beat may feel more forceful.  A feeling that your heart is fluttering. How is this diagnosed? This condition is diagnosed based on:  Your symptoms.  A physical exam. Your health care provider may listen to your heart.  Tests to rule out other conditions, such as a test that records the electrical impulses of the heart and assesses heart health (electrocardiogram, or ECG). If you have an ECG, you may need to wear a portable ECG machine (Holter monitor) that records your heart beats for 24 hours or more. How is this treated?   Usually, treatment is not needed for this condition. If you have episodes that happen often or if a cause is found, you may receive treatment for the underlying cause of your PACs. Follow these instructions at home: Lifestyle Follow these instructions as told by your health care provider:  Do not use any products that contain nicotine or tobacco, such as cigarettes and e-cigarettes. If you need help quitting, ask your health care provider.  If caffeine triggers episodes, do not eat, drink, or use anything with caffeine in it.  If caffeine does not seem to trigger episodes, consume caffeine in moderation.  If alcohol triggers episodes of PAC, do not drink alcohol.  If alcohol does not seem to trigger episodes, limit alcohol intake to no more than 1 drink a day for nonpregnant women and  2 drinks a day for men. One drink equals 12 oz of beer, 5 oz of wine, or 1 oz of hard liquor.  Exercise regularly. Ask your health care provider what type of exercise is safe for you.  Find healthy ways to manage stress. Avoid stressful situations when possible.  Try to get at least 7-9 hours of sleep each night, or as much as recommended by your health care provider.  Do not use illegal drugs. General instructions  Take over-the-counter and  prescription medicines only as told by your health care provider.  Keep all follow-up visits as told by your health care provider. This is important. Contact a health care provider if:  You feel your heart skipping beats more than once a day.  Your heart skips beats and you feel dizzy, light-headed, or very tired. Get help right away if:  You have chest pain.  You have trouble breathing. This information is not intended to replace advice given to you by your health care provider. Make sure you discuss any questions you have with your health care provider. Document Released: 05/12/2014 Document Revised: 05/06/2016 Document Reviewed: 03/07/2016 Elsevier Interactive Patient Education  2017 [employer].

## 2016-10-03 MED FILL — PROPRANOLOL 40 MG TABLET: 40 | 8 days supply | Qty: 30 | Fill #0

## 2016-10-03 MED FILL — FUROSEMIDE 20 MG TABLET: 20 | 30 days supply | Qty: 30 | Fill #0

## 2016-10-04 LAB — COMPREHENSIVE METABOLIC PANEL
ALT: 44 IU/L — ABNORMAL HIGH (ref 0–32)
AST: 21 IU/L (ref 0–40)
Albumin/Globulin Ratio: 1.8 (ref 1.2–2.2)
Albumin: 4.5 g/dL (ref 3.5–5.5)
Alkaline Phosphatase: 71 IU/L (ref 39–117)
BUN/Creatinine Ratio: 25 — ABNORMAL HIGH (ref 9–23)
BUN: 19 mg/dL (ref 6–24)
Bilirubin Total: <0.2 mg/dL (ref 0.0–1.2)
CO2: 23 mmol/L (ref 18–29)
Calcium: 9.6 mg/dL (ref 8.7–10.2)
Chloride: 100 mmol/L (ref 96–106)
Creatinine, Ser: 0.75 mg/dL (ref 0.57–1.00)
GFR calc Af Amer: 101 mL/min/{1.73_m2} (ref >59)
GFR calc non Af Amer: 88 mL/min/{1.73_m2} (ref >59)
Globulin, Total: 2.5 g/dL (ref 1.5–4.5)
Glucose: 102 mg/dL — ABNORMAL HIGH (ref 65–99)
Potassium: 5.9 mmol/L — ABNORMAL HIGH (ref 3.5–5.2)
Sodium: 140 mmol/L (ref 134–144)
Total Protein: 7 g/dL (ref 6.0–8.5)

## 2016-10-04 LAB — CBC
Hematocrit: 40.8 % (ref 34.0–46.6)
Hemoglobin: 13.4 g/dL (ref 11.1–15.9)
MCH: 27.6 pg (ref 26.6–33.0)
MCHC: 32.8 g/dL (ref 31.5–35.7)
MCV: 84 fL (ref 79–97)
Platelets: 241 10*3/uL (ref 150–379)
RBC: 4.85 x10E6/uL (ref 3.77–5.28)
RDW: 14.5 % (ref 12.3–15.4)
WBC: 6.3 10*3/uL (ref 3.4–10.8)

## 2016-10-04 LAB — TSH: TSH: 1.94 u[IU]/mL (ref 0.450–4.500)

## 2016-10-06 ENCOUNTER — Other Ambulatory Visit: Payer: 59 | Admitting: Family Medicine

## 2016-10-06 DIAGNOSIS — E875 Hyperkalemia: Secondary | ICD-10-CM | POA: Diagnosis not present

## 2016-10-06 NOTE — Progress Notes (Signed)
Here for lab only visit 

## 2016-10-07 LAB — POTASSIUM: Potassium: 4.2 mmol/L (ref 3.5–5.2)

## 2016-10-08 ENCOUNTER — Encounter: Payer: Self-pay | Admitting: Family Medicine

## 2016-10-16 MED FILL — QVAR 80 MCG ORAL INHALER: 80 | 30 days supply | Qty: 9 | Fill #3

## 2016-10-16 MED FILL — ALPRAZolam 0.25 MG TABS: 0.25 | 30 days supply | Qty: 30 | Fill #1

## 2016-10-29 MED FILL — valACYclovir HCL 1 GM TABS: 1 | 30 days supply | Qty: 30 | Fill #1

## 2016-11-04 ENCOUNTER — Other Ambulatory Visit: Payer: Self-pay | Admitting: Family Medicine

## 2016-11-06 DIAGNOSIS — Z1231 Encounter for screening mammogram for malignant neoplasm of breast: Secondary | ICD-10-CM | POA: Diagnosis not present

## 2016-11-06 DIAGNOSIS — Z01419 Encounter for gynecological examination (general) (routine) without abnormal findings: Secondary | ICD-10-CM | POA: Diagnosis not present

## 2016-11-06 DIAGNOSIS — N952 Postmenopausal atrophic vaginitis: Secondary | ICD-10-CM | POA: Diagnosis not present

## 2016-11-06 DIAGNOSIS — N800 Endometriosis of uterus: Secondary | ICD-10-CM | POA: Diagnosis not present

## 2016-11-06 DIAGNOSIS — R002 Palpitations: Secondary | ICD-10-CM | POA: Diagnosis not present

## 2016-11-06 DIAGNOSIS — Z6825 Body mass index (BMI) 25.0-25.9, adult: Secondary | ICD-10-CM | POA: Diagnosis not present

## 2016-11-06 DIAGNOSIS — F419 Anxiety disorder, unspecified: Secondary | ICD-10-CM | POA: Diagnosis not present

## 2016-11-06 MED FILL — ESTRADIOL 0.1 MG/GM CRM: 0.1 | 90 days supply | Qty: 43 | Fill #0

## 2016-11-06 NOTE — Telephone Encounter (Signed)
09/2015 last refill, ok to refill or using to much? Re check?

## 2016-11-07 MED FILL — PROPRANOLOL 40 MG TABLET: 40 | 8 days supply | Qty: 30 | Fill #0

## 2016-12-18 MED FILL — FLUTICASONE PROP 50 MCG SPR: 50 | 30 days supply | Qty: 16 | Fill #1

## 2016-12-30 ENCOUNTER — Ambulatory Visit (INDEPENDENT_AMBULATORY_CARE_PROVIDER_SITE_OTHER): Payer: 59 | Admitting: Urgent Care

## 2016-12-30 VITALS — BP 135/83 | HR 75 | Temp 98.3°F | Resp 17 | Ht 64.0 in | Wt 143.0 lb

## 2016-12-30 DIAGNOSIS — R7989 Other specified abnormal findings of blood chemistry: Secondary | ICD-10-CM | POA: Diagnosis not present

## 2016-12-30 DIAGNOSIS — R319 Hematuria, unspecified: Secondary | ICD-10-CM | POA: Diagnosis not present

## 2016-12-30 DIAGNOSIS — R945 Abnormal results of liver function studies: Secondary | ICD-10-CM

## 2016-12-30 LAB — COMPREHENSIVE METABOLIC PANEL
ALT: 56 IU/L — ABNORMAL HIGH (ref 0–32)
AST: 27 IU/L (ref 0–40)
Albumin/Globulin Ratio: 2.1 (ref 1.2–2.2)
Albumin: 4.6 g/dL (ref 3.5–5.5)
Alkaline Phosphatase: 68 IU/L (ref 39–117)
BUN/Creatinine Ratio: 19 (ref 9–23)
BUN: 15 mg/dL (ref 6–24)
Bilirubin Total: 0.3 mg/dL (ref 0.0–1.2)
CO2: 24 mmol/L (ref 18–29)
Calcium: 9.8 mg/dL (ref 8.7–10.2)
Chloride: 100 mmol/L (ref 96–106)
Creatinine, Ser: 0.79 mg/dL (ref 0.57–1.00)
GFR calc Af Amer: 95 mL/min/{1.73_m2} (ref >59)
GFR calc non Af Amer: 82 mL/min/{1.73_m2} (ref >59)
Globulin, Total: 2.2 g/dL (ref 1.5–4.5)
Glucose: 99 mg/dL (ref 65–99)
Potassium: 4.1 mmol/L (ref 3.5–5.2)
Sodium: 140 mmol/L (ref 134–144)
Total Protein: 6.8 g/dL (ref 6.0–8.5)

## 2016-12-30 LAB — POCT URINALYSIS DIP (MANUAL ENTRY)
Bilirubin, UA: NEGATIVE
Blood, UA: NEGATIVE
Glucose, UA: NEGATIVE
Ketones, POC UA: NEGATIVE
Leukocytes, UA: NEGATIVE
Nitrite, UA: NEGATIVE
Protein Ur, POC: NEGATIVE
Spec Grav, UA: 1.01 (ref 1.030–1.035)
Urobilinogen, UA: 0.2 (ref ?–2.0)
pH, UA: 7 (ref 5.0–8.0)

## 2016-12-30 LAB — POC MICROSCOPIC URINALYSIS (UMFC): Mucus: ABSENT

## 2016-12-30 MED ORDER — NITROFURANTOIN MONOHYD MACRO 100 MG PO CAPS: 100.0000 mg | ORAL_CAPSULE | Freq: Two times a day (BID) | ORAL | 0 refills | Status: DC

## 2016-12-30 NOTE — Patient Instructions (Addendum)
Hematuria, Adult Hematuria is blood in your urine. It can be caused by a bladder infection, kidney infection, prostate infection, kidney stone, or cancer of your urinary tract. Infections can usually be treated with medicine, and a kidney stone usually will pass through your urine. If neither of these is the cause of your hematuria, further workup to find out the reason may be needed. It is very important that you tell your health care provider about any blood you see in your urine, even if the blood stops without treatment or happens without causing pain. Blood in your urine that happens and then stops and then happens again can be a symptom of a very serious condition. Also, pain is not a symptom in the initial stages of many urinary cancers. Follow these instructions at home:  Drink lots of fluid, 3-4 quarts a day. If you have been diagnosed with an infection, cranberry juice is especially recommended, in addition to large amounts of water.  Avoid caffeine, tea, and carbonated beverages because they tend to irritate the bladder.  Avoid alcohol because it may irritate the prostate.  Take all medicines as directed by your health care provider.  If you were prescribed an antibiotic medicine, finish it all even if you start to feel better.  If you have been diagnosed with a kidney stone, follow your health care provider's instructions regarding straining your urine to catch the stone.  Empty your bladder often. Avoid holding urine for long periods of time.  After a bowel movement, women should cleanse front to back. Use each tissue only once.  Empty your bladder before and after sexual intercourse if you are a female. Contact a health care provider if:  You develop back pain.  You have a fever.  You have a feeling of sickness in your stomach (nausea) or vomiting.  Your symptoms are not better in 3 days. Return sooner if you are getting worse. Get help right away if:  You develop  severe vomiting and are unable to keep the medicine down.  You develop severe back or abdominal pain despite taking your medicines.  You begin passing a large amount of blood or clots in your urine.  You feel extremely weak or faint, or you pass out. This information is not intended to replace advice given to you by your health care provider. Make sure you discuss any questions you have with your health care provider. Document Released: 09/08/2005 Document Revised: 02/14/2016 Document Reviewed: 05/09/2013 Elsevier Interactive Patient Education  2017 [employer].    Alanine Aminotransferase Test Why am I having this test? Alanine aminotransferase (ALT) is an enzyme that is found mainly in the liver but also in the kidney, heart, and skeletal muscle. Under normal conditions, ALT levels in the blood are low. Elevated levels may be caused by:  Damage to the liver. This is the main cause of an abnormality.  Damage to the kidney, heart, and skeletal muscle.  Certain medicines. ALT is tested in long-standing liver disease. It may be used to monitor the treatment of people who have liver disease or to see if the treatment is working. ALT may be ordered either by itself or along with other tests. What kind of sample is taken? A blood sample is required for this test. It is usually collected by inserting a needle into a vein. How do I prepare for this test? There is no preparation or fasting for this test. What are the reference ranges? Reference ranges are considered healthy ranges  established after testing a large group of healthy people. Reference ranges may vary among different people, labs, and hospitals. It is your responsibility to obtain your test results. Ask the lab or department performing the test when and how you will get your results. The reference ranges for the ALT test are as follows:  Adult or child: 4-36 International Units per liter.  Elderly adult: may be slightly higher  than adult values.  Infant: may be twice as high as adult values.  Values may be higher in men and African Americans. What do the results mean? Test results that are significantly elevated may indicate:  Hepatitis.  Liver tissue death (hepatic necrosis).  Decreased blood flow to the liver (hepatic ischemia). Test results that are moderately elevated may indicate:  Cirrhosis.  Liver tumor.  Medicines that are causing strain on the liver.  Severe burns. Test results that are mildly elevated may indicate:  Pancreatitis.  Heart attack.  Infectious mononucleosis.  Shock. Talk with your health care provider to discuss your results, treatment options, and if necessary, the need for more tests. Talk with your health care provider if you have any questions about your results. Talk with your health care provider to discuss your results, treatment options, and if necessary, the need for more tests. Talk with your health care provider if you have any questions about your results. This information is not intended to replace advice given to you by your health care provider. Make sure you discuss any questions you have with your health care provider. Document Released: 09/30/2004 Document Revised: 05/12/2016 Document Reviewed: 02/09/2014 Elsevier Interactive Patient Education  2017 [employer].     IF you received an x-ray today, you will receive an invoice from [facility]. Please contact [facility] at [phone] with questions or concerns regarding your invoice.   IF you received labwork today, you will receive an invoice from [facility]. Please contact [facility] at [phone] with questions or concerns regarding your invoice.   Our billing staff will not be able to assist you with questions regarding bills from these companies.  You will be contacted with the lab results as soon as they are available. The fastest way to get your results is to activate your My  Chart account. Instructions are located on the last page of this paperwork. If you have not heard from [place] regarding the results in 2 weeks, please contact this office.

## 2016-12-30 NOTE — Progress Notes (Signed)
MRN: [id number] DOB: [birth year]  Subjective:   Amy Phillips is a 61 y.o. female presenting for chief complaint of blood in urine  Hematuria - Reports 2 day history of dark urine, hematuria, malodorous urine, mild nausea without vomiting, mild headache. Has tried Azo, cranberry juice and hydrating. 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 Has never had an UTI. Has one previous episode of similar hematuria, resolved with Azo and hydration. Of note, patient uses ibuprofen 400-600mg  nightly due to back pain associated with her hysterectomy and current nature of her work. She also drives 1.5 hours round trip to and from work. Denies fever, dysuria, urinary frequency, flank pain, abdominal pain, pelvic pain, vaginal discharge, genital rashes. Denies history of renal stones. Denies smoking cigarettes. Has occasional alcohol drink.  LFTs - Patient is requesting we recheck her LFTs. Last check was in 10/02/2016. Would like this repeated. Used use a lot of APAP prior to having her LFTs checked. Now she uses NSAIDs as above.  Amy Phillips has a current medication list which includes the following prescription(s): alprazolam, cetirizine, doxycycline, esomeprazole, fluticasone, furosemide, propranolol, valacyclovir, and ventolin hfa. Also is allergic to peanuts [peanut oil]; shellfish allergy; and strawberry (diagnostic). Amy Phillips  has a past medical history of Allergy; Anxiety; Arrhythmia; Asthma; Diverticulitis (08/05/2012); GERD (gastroesophageal reflux disease); and Uterine fibroid (2006). Also  has a past surgical history that includes Dilation and curettage of uterus; Dilatation & curettage/hysteroscopy with trueclear (N/A, 09/08/2013); Upper gi endoscopy (07/23/12); Abdominal hysterectomy (N/A, 06/12/2015); and Bilateral salpingectomy (06/12/2015).  Objective:   Vitals: BP 135/83   Pulse 75   Temp 98.3 F (36.8 C) (Oral)   Resp 17   Ht 5\' 4"  (1.626 m)   Wt 143 lb (64.9 kg)   LMP  (LMP Unknown)  Comment: AUB  SpO2 97%   BMI 24.55 kg/m   Physical Exam  Constitutional: She is oriented to person, place, and time. She appears well-developed and well-nourished.  Eyes: No scleral icterus.  Cardiovascular: Normal rate, regular rhythm and intact distal pulses.  Exam reveals no gallop and no friction rub.   No murmur heard. Pulmonary/Chest: No respiratory distress. She has no wheezes. She has no rales.  Abdominal: Soft. Bowel sounds are normal. She exhibits no distension and no mass. There is no tenderness. There is no guarding.  No CVA tenderness.  Neurological: She is alert and oriented to person, place, and time.  Skin: Skin is warm and dry. Capillary refill takes less than 2 seconds.    Results for orders placed or performed in visit on 12/30/16 (from the past 24 hour(s))  POCT urinalysis dipstick     Status: None   Collection Time: 12/30/16 10:31 AM  Result Value Ref Range   Color, UA yellow yellow   Clarity, UA clear clear   Glucose, UA negative negative   Bilirubin, UA negative negative   Ketones, POC UA negative negative   Spec Grav, UA 1.010 1.030 - 1.035   Blood, UA negative negative   pH, UA 7.0 5.0 - 8.0   Protein Ur, POC negative negative   Urobilinogen, UA 0.2 Negative - 2.0   Nitrite, UA Negative Negative   Leukocytes, UA Negative Negative  POCT Microscopic Urinalysis ([facility])     Status: Abnormal   Collection Time: 12/30/16 10:44 AM  Result Value Ref Range   WBC,UR,HPF,POC None None WBC/hpf   RBC,UR,HPF,POC None None RBC/hpf   Bacteria None None, Too numerous to count   Mucus Absent Absent  Epithelial Cells, UR Per Microscopy Few (A) None, Too numerous to count cells/hpf    Assessment and Plan :   1. Hematuria, unspecified type - Labs pending. Continue aggressive hydration. Provided patient with printed script of Macrobid in case her symptoms worsen while awaiting urine culture.  - POCT Microscopic Urinalysis ([facility]) - POCT urinalysis dipstick - Urine  culture  2. Elevated LFTs - Comprehensive metabolic panel pending. - Discussed differential.   Amy Eagles, PA-C [facility] [phone] 12/30/2016  10:51 AM

## 2016-12-31 LAB — URINE CULTURE: Organism ID, Bacteria: NO GROWTH

## 2017-02-11 ENCOUNTER — Other Ambulatory Visit: Payer: Self-pay | Admitting: Family Medicine

## 2017-02-11 DIAGNOSIS — J4540 Moderate persistent asthma, uncomplicated: Secondary | ICD-10-CM

## 2017-02-11 MED FILL — ALPRAZolam 0.25 MG TABS: 0.25 | 30 days supply | Qty: 30 | Fill #2

## 2017-02-17 ENCOUNTER — Encounter: Payer: Self-pay | Admitting: Family Medicine

## 2017-02-17 ENCOUNTER — Telehealth: Payer: Self-pay

## 2017-02-17 NOTE — Telephone Encounter (Signed)
Asking for refill on qvar redihaler, I dont see on the list

## 2017-02-17 NOTE — Telephone Encounter (Signed)
Pt is requesting a call back from Dr. Brigitte Pulse to go over her medication Qvar.  Pt contact number [phone].  Pt is aware of the time frame for call backs are 48-72 hours.

## 2017-02-18 MED ORDER — BECLOMETHASONE DIPROP HFA 80 MCG/ACT IN AERB: 2.0000 | INHALATION_SPRAY | Freq: Two times a day (BID) | RESPIRATORY_TRACT | 11 refills | Status: DC

## 2017-02-18 MED FILL — QVAR REDIHALER 80 MCG/ACT A: 80 | 30 days supply | Qty: 11 | Fill #0

## 2017-02-18 NOTE — Telephone Encounter (Signed)
Sent med to pharmacy and sent pt Mychart email to inform her.

## 2017-02-20 MED FILL — VENTOLIN HFA 90 MCG INHALER: 108 (90 BAS | 17 days supply | Qty: 18 | Fill #0

## 2017-02-20 MED FILL — FLUTICASONE PROP 50 MCG SPR: 50 | 30 days supply | Qty: 16 | Fill #2

## 2017-06-12 DIAGNOSIS — H25013 Cortical age-related cataract, bilateral: Secondary | ICD-10-CM | POA: Diagnosis not present

## 2017-06-12 DIAGNOSIS — H5310 Unspecified subjective visual disturbances: Secondary | ICD-10-CM | POA: Diagnosis not present

## 2017-06-12 DIAGNOSIS — H35363 Drusen (degenerative) of macula, bilateral: Secondary | ICD-10-CM | POA: Diagnosis not present

## 2017-06-12 DIAGNOSIS — H2513 Age-related nuclear cataract, bilateral: Secondary | ICD-10-CM | POA: Diagnosis not present

## 2017-06-15 DIAGNOSIS — L700 Acne vulgaris: Secondary | ICD-10-CM | POA: Diagnosis not present

## 2017-06-15 DIAGNOSIS — Z23 Encounter for immunization: Secondary | ICD-10-CM | POA: Diagnosis not present

## 2017-06-15 DIAGNOSIS — C44311 Basal cell carcinoma of skin of nose: Secondary | ICD-10-CM | POA: Diagnosis not present

## 2017-06-15 DIAGNOSIS — D485 Neoplasm of uncertain behavior of skin: Secondary | ICD-10-CM | POA: Diagnosis not present

## 2017-06-15 MED FILL — DOXYCYCLINE HYCLATE 100 MG: 100 | 30 days supply | Qty: 30 | Fill #0

## 2017-06-23 ENCOUNTER — Other Ambulatory Visit: Payer: Self-pay | Admitting: Family Medicine

## 2017-06-23 DIAGNOSIS — F419 Anxiety disorder, unspecified: Secondary | ICD-10-CM

## 2017-06-23 DIAGNOSIS — C44311 Basal cell carcinoma of skin of nose: Secondary | ICD-10-CM | POA: Diagnosis not present

## 2017-06-23 MED FILL — QVAR REDIHALER 80 MCG/ACT A: 80 | 30 days supply | Qty: 11 | Fill #1

## 2017-06-23 MED FILL — VENTOLIN HFA 90 MCG INHALER: 108 (90 BAS | 17 days supply | Qty: 18 | Fill #1

## 2017-06-26 MED FILL — ALPRAZolam 0.25 MG TABS: 0.25 | 30 days supply | Qty: 30 | Fill #0

## 2017-08-10 ENCOUNTER — Encounter (HOSPITAL_BASED_OUTPATIENT_CLINIC_OR_DEPARTMENT_OTHER): Payer: Self-pay | Admitting: *Deleted

## 2017-08-10 ENCOUNTER — Other Ambulatory Visit: Payer: Self-pay

## 2017-08-10 DIAGNOSIS — C44311 Basal cell carcinoma of skin of nose: Secondary | ICD-10-CM | POA: Diagnosis not present

## 2017-08-11 ENCOUNTER — Ambulatory Visit: Payer: Self-pay | Admitting: Plastic Surgery

## 2017-08-11 DIAGNOSIS — M950 Acquired deformity of nose: Secondary | ICD-10-CM

## 2017-08-11 NOTE — H&P (Signed)
Amy Phillips is an 61 y.o. female.   Chief Complaint: basal cell carcinoma HPI: The patient is a 61 y.o. yrs old wf here for a basal cell carcinoma on her nose.  It is 4 mm in size at least at the biopsy site.  It is on the dorsal right aspect of the nose.  She is otherwise in good health.  The lesion has been present for months and was getting larger.  She is already scheduled for mohs resection and would like a coordinated closure after the resection.  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 Allergy   . Anxiety   . Arrhythmia    PVCs- no meds  . Asthma   . Basal cell carcinoma of nose   . Diverticulitis 08/05/2012   Dr Collene Mares  . GERD (gastroesophageal reflux disease)   . PVC's (premature ventricular contractions)   . Uterine fibroid 2006   16 week size    Past Surgical History:  Procedure Laterality Date  . ABDOMINAL HYSTERECTOMY N/A 06/12/2015   Procedure: HYSTERECTOMY ABDOMINAL With Vertical Incision;  Surgeon: Eldred Manges, MD;  Location: [facility];  Service: Gynecology;  Laterality: N/A;  . BILATERAL SALPINGECTOMY  06/12/2015   Procedure: BILATERAL SALPINGECTOMY;  Surgeon: Eldred Manges, MD;  Location: [facility];  Service: Gynecology;;  . DILATATION & CURETTAGE/HYSTEROSCOPY WITH TRUECLEAR N/A 09/08/2013   Procedure: DILATATION & CURETTAGE/HYSTEROSCOPY WITH TRUCLEAR;  Surgeon: Azalia Bilis, MD;  Location: [facility];  Service: Gynecology;  Laterality: N/A;  . DILATION AND CURETTAGE OF UTERUS     D&E x2 missed ab  . UPPER GI ENDOSCOPY  07/23/12   grade 1 esophagitis noted:  otherwise normal esophagogastrduodenoscopy    Family History  Problem Relation Age of Onset  . Arthritis Mother   . Osteoporosis Mother   . Breast cancer Mother   . Cancer Mother        lung  . COPD Mother   . Cancer Father        lung  . COPD Father   . Heart disease Father   . Hypertension Sister   . Heart disease Sister   . Arthritis Sister   .  Hypertension Brother    Social History:  reports that  has never smoked. she has never used smokeless tobacco. She reports that she drinks alcohol. She reports that she does not use drugs.  Allergies:  Allergies  Allergen Reactions  . Peanuts [Peanut Oil] Other (See Comments)    Mood changes per pt and husband   . Shellfish Allergy Other (See Comments)    Throat irritation  . Strawberry (Diagnostic)      (Not in a hospital admission)  No results found for this or any previous visit (from the past 48 hour(s)). No results found.  Review of Systems  Constitutional: Negative.   HENT: Negative.   Eyes: Negative.   Respiratory: Negative.   Cardiovascular: Negative.   Gastrointestinal: Negative.   Genitourinary: Negative.   Musculoskeletal: Negative.   Skin: Negative.   Neurological: Negative.   Psychiatric/Behavioral: Negative.     There were no vitals taken for this visit. Physical Exam  HEENT within normal limits. Lungs clear Heart RRR Nose as decribed above.  Assessment/Plan Plan for rotation flap, forehead flap or skin graft for nasal reconstruction.  [place], DO 08/11/2017, 1:42 PM

## 2017-08-18 DIAGNOSIS — C44311 Basal cell carcinoma of skin of nose: Secondary | ICD-10-CM | POA: Diagnosis not present

## 2017-08-18 MED FILL — DOXYCYCLINE HYCLATE 100 MG: 100 | 7 days supply | Qty: 14 | Fill #0

## 2017-08-19 ENCOUNTER — Ambulatory Visit (HOSPITAL_BASED_OUTPATIENT_CLINIC_OR_DEPARTMENT_OTHER): Admission: RE | Admit: 2017-08-19 | Payer: 59 | Source: Ambulatory Visit | Admitting: Plastic Surgery

## 2017-08-19 DIAGNOSIS — C44311 Basal cell carcinoma of skin of nose: Secondary | ICD-10-CM

## 2017-08-19 HISTORY — DX: Ventricular premature depolarization: I49.3

## 2017-08-19 HISTORY — DX: Basal cell carcinoma of skin of nose: C44.311

## 2017-08-19 SURGERY — CREATION, FLAP, PEDICLED ROTATION, FOREHEAD AND NOSE
Anesthesia: General

## 2017-09-23 MED FILL — DOXYCYCLINE HYCLATE 100 MG: 100 | 30 days supply | Qty: 30 | Fill #1

## 2017-11-16 DIAGNOSIS — N800 Endometriosis of uterus: Secondary | ICD-10-CM | POA: Diagnosis not present

## 2017-11-16 DIAGNOSIS — Z1231 Encounter for screening mammogram for malignant neoplasm of breast: Secondary | ICD-10-CM | POA: Diagnosis not present

## 2017-11-16 DIAGNOSIS — Z6825 Body mass index (BMI) 25.0-25.9, adult: Secondary | ICD-10-CM | POA: Diagnosis not present

## 2017-11-16 DIAGNOSIS — N952 Postmenopausal atrophic vaginitis: Secondary | ICD-10-CM | POA: Diagnosis not present

## 2017-11-16 DIAGNOSIS — Z01419 Encounter for gynecological examination (general) (routine) without abnormal findings: Secondary | ICD-10-CM | POA: Diagnosis not present

## 2017-11-16 DIAGNOSIS — C4490 Unspecified malignant neoplasm of skin, unspecified: Secondary | ICD-10-CM | POA: Diagnosis not present

## 2017-11-16 MED FILL — ESTRADIOL 0.1 MG/GM CRM: 0.1 | 90 days supply | Qty: 43 | Fill #0

## 2017-11-18 MED FILL — QVAR REDIHALER 80 MCG/ACT A: 80 | 30 days supply | Qty: 11 | Fill #2

## 2017-12-07 ENCOUNTER — Telehealth: Payer: 59 | Admitting: Family

## 2017-12-07 DIAGNOSIS — R112 Nausea with vomiting, unspecified: Secondary | ICD-10-CM

## 2017-12-07 MED ORDER — ONDANSETRON 4 MG PO TBDP: 4.0000 mg | ORAL_TABLET | Freq: Three times a day (TID) | ORAL | 0 refills | Status: DC | PRN

## 2017-12-07 MED FILL — ONDANSETRON ODT 4 MG TABLET: 4 | 10 days supply | Qty: 30 | Fill #0

## 2017-12-07 NOTE — Progress Notes (Signed)
Thank you for the details you included in the comment boxes. Those details are very helpful in determining the best course of treatment for you and help us to provide the best care.  We are sorry that you are not feeling well. Here is how we plan to help!  Based on what you have shared with me it looks like you have a Virus that is irritating your GI tract.  Vomiting is the forceful emptying of a portion of the stomach's content through the mouth.  Although nausea and vomiting can make you feel miserable, it's important to remember that these are not diseases, but rather symptoms of an underlying illness.  When we treat short term symptoms, we always caution that any symptoms that persist should be fully evaluated in a medical office.  I have prescribed a medication that will help alleviate your symptoms and allow you to stay hydrated:  Zofran 4 mg 1 tablet every 8 hours as needed for nausea and vomiting  HOME CARE:  Drink clear liquids.  This is very important! Dehydration (the lack of fluid) can lead to a serious complication.  Start off with 1 tablespoon every 5 minutes for 8 hours.  You may begin eating bland foods after 8 hours without vomiting.  Start with saltine crackers, white bread, rice, mashed potatoes, applesauce.  After 48 hours on a bland diet, you may resume a normal diet.  Try to go to sleep.  Sleep often empties the stomach and relieves the need to vomit.  GET HELP RIGHT AWAY IF:   Your symptoms do not improve or worsen within 2 days after treatment.  You have a fever for over 3 days.  You cannot keep down fluids after trying the medication.  MAKE SURE YOU:   Understand these instructions.  Will watch your condition.  Will get help right away if you are not doing well or get worse.   Thank you for choosing an e-visit. Your e-visit answers were reviewed by a board certified advanced clinical practitioner to complete your personal care plan. Depending upon the  condition, your plan could have included both over the counter or prescription medications. Please review your pharmacy choice. Be sure that the pharmacy you have chosen is open so that you can pick up your prescription now.  If there is a problem you may message your provider in MyChart to have the prescription routed to another pharmacy. Your safety is important to us. If you have drug allergies check your prescription carefully.  For the next 24 hours, you can use MyChart to ask questions about today's visit, request a non-urgent call back, or ask for a work or school excuse from your e-visit provider. You will get an e-mail in the next two days asking about your experience. I hope that your e-visit has been valuable and will speed your recovery.    

## 2017-12-29 ENCOUNTER — Ambulatory Visit: Payer: 59 | Admitting: Family Medicine

## 2018-01-06 ENCOUNTER — Other Ambulatory Visit: Payer: Self-pay | Admitting: Family Medicine

## 2018-01-06 DIAGNOSIS — F419 Anxiety disorder, unspecified: Secondary | ICD-10-CM

## 2018-01-06 MED FILL — QVAR REDIHALER 80 MCG/ACT A: 80 | 30 days supply | Qty: 11 | Fill #3

## 2018-01-06 MED FILL — VENTOLIN HFA 90 MCG INHALER: 108 (90 BAS | 17 days supply | Qty: 18 | Fill #2

## 2018-01-06 NOTE — Telephone Encounter (Signed)
Patient called, left detailed VM to return call to the office to schedule an office visit in order to refill medications.

## 2018-01-12 MED FILL — DOXYCYCLINE HYCLATE 100 MG: 100 | 30 days supply | Qty: 30 | Fill #0

## 2018-01-21 DIAGNOSIS — L603 Nail dystrophy: Secondary | ICD-10-CM | POA: Diagnosis not present

## 2018-01-21 DIAGNOSIS — D1801 Hemangioma of skin and subcutaneous tissue: Secondary | ICD-10-CM | POA: Diagnosis not present

## 2018-01-21 DIAGNOSIS — D225 Melanocytic nevi of trunk: Secondary | ICD-10-CM | POA: Diagnosis not present

## 2018-01-21 DIAGNOSIS — L719 Rosacea, unspecified: Secondary | ICD-10-CM | POA: Diagnosis not present

## 2018-01-21 DIAGNOSIS — C44519 Basal cell carcinoma of skin of other part of trunk: Secondary | ICD-10-CM | POA: Diagnosis not present

## 2018-01-21 DIAGNOSIS — L821 Other seborrheic keratosis: Secondary | ICD-10-CM | POA: Diagnosis not present

## 2018-01-21 DIAGNOSIS — Z85828 Personal history of other malignant neoplasm of skin: Secondary | ICD-10-CM | POA: Diagnosis not present

## 2018-01-21 DIAGNOSIS — D485 Neoplasm of uncertain behavior of skin: Secondary | ICD-10-CM | POA: Diagnosis not present

## 2018-01-21 DIAGNOSIS — L700 Acne vulgaris: Secondary | ICD-10-CM | POA: Diagnosis not present

## 2018-01-21 DIAGNOSIS — L814 Other melanin hyperpigmentation: Secondary | ICD-10-CM | POA: Diagnosis not present

## 2018-01-21 MED FILL — valACYclovir HCL 1 GM TABS: 1 | 8 days supply | Qty: 30 | Fill #0

## 2018-01-21 MED FILL — AZELAIC ACID 15 % GEL: 15 | 90 days supply | Qty: 150 | Fill #0

## 2018-01-25 MED FILL — TRETINOIN 0.025% CREAM: 0.025 | 30 days supply | Qty: 20 | Fill #0

## 2018-01-28 ENCOUNTER — Encounter: Payer: Self-pay | Admitting: Family Medicine

## 2018-01-28 ENCOUNTER — Other Ambulatory Visit: Payer: Self-pay | Admitting: Family Medicine

## 2018-02-22 ENCOUNTER — Encounter: Payer: Self-pay | Admitting: Family Medicine

## 2018-03-10 DIAGNOSIS — C44519 Basal cell carcinoma of skin of other part of trunk: Secondary | ICD-10-CM | POA: Diagnosis not present

## 2018-03-17 ENCOUNTER — Ambulatory Visit: Payer: 59 | Admitting: Family Medicine

## 2018-03-18 ENCOUNTER — Ambulatory Visit: Payer: 59 | Admitting: Family Medicine

## 2018-03-18 ENCOUNTER — Encounter: Payer: Self-pay | Admitting: Family Medicine

## 2018-03-18 ENCOUNTER — Other Ambulatory Visit: Payer: Self-pay

## 2018-03-18 VITALS — BP 108/72 | HR 87 | Temp 98.8°F | Resp 16 | Ht 62.4 in | Wt 140.2 lb

## 2018-03-18 DIAGNOSIS — J4540 Moderate persistent asthma, uncomplicated: Secondary | ICD-10-CM

## 2018-03-18 DIAGNOSIS — Z113 Encounter for screening for infections with a predominantly sexual mode of transmission: Secondary | ICD-10-CM | POA: Diagnosis not present

## 2018-03-18 DIAGNOSIS — Z1159 Encounter for screening for other viral diseases: Secondary | ICD-10-CM

## 2018-03-18 DIAGNOSIS — R740 Nonspecific elevation of levels of transaminase and lactic acid dehydrogenase [LDH]: Secondary | ICD-10-CM

## 2018-03-18 DIAGNOSIS — Z114 Encounter for screening for human immunodeficiency virus [HIV]: Secondary | ICD-10-CM

## 2018-03-18 DIAGNOSIS — F419 Anxiety disorder, unspecified: Secondary | ICD-10-CM

## 2018-03-18 DIAGNOSIS — E782 Mixed hyperlipidemia: Secondary | ICD-10-CM | POA: Diagnosis not present

## 2018-03-18 DIAGNOSIS — R002 Palpitations: Secondary | ICD-10-CM | POA: Diagnosis not present

## 2018-03-18 DIAGNOSIS — R7401 Elevation of levels of liver transaminase levels: Secondary | ICD-10-CM

## 2018-03-18 MED ORDER — BECLOMETHASONE DIPROP HFA 80 MCG/ACT IN AERB: 2.0000 | INHALATION_SPRAY | Freq: Two times a day (BID) | RESPIRATORY_TRACT | 11 refills | Status: DC

## 2018-03-18 MED ORDER — PROPRANOLOL HCL 40 MG PO TABS: 40.0000 mg | ORAL_TABLET | Freq: Four times a day (QID) | ORAL | 5 refills | Status: DC

## 2018-03-18 MED ORDER — VENTOLIN HFA 108 (90 BASE) MCG/ACT IN AERS: INHALATION_SPRAY | RESPIRATORY_TRACT | 11 refills | Status: DC

## 2018-03-18 MED ORDER — ESOMEPRAZOLE MAGNESIUM 20 MG PO CPDR: 20.0000 mg | DELAYED_RELEASE_CAPSULE | Freq: Two times a day (BID) | ORAL | 3 refills | Status: DC

## 2018-03-18 MED ORDER — ALPRAZOLAM 0.25 MG PO TABS: 0.2500 mg | ORAL_TABLET | Freq: Every evening | ORAL | 1 refills | Status: DC | PRN

## 2018-03-18 MED FILL — PROPRANOLOL 40 MG TABLET: 40 | 7 days supply | Qty: 30 | Fill #0

## 2018-03-18 MED FILL — ALPRAZolam 0.25 MG TABS: 0.25 | 30 days supply | Qty: 30 | Fill #0

## 2018-03-18 NOTE — Progress Notes (Addendum)
Subjective:  By signing my name below, I, Amy Phillips, attest that this documentation has been prepared under the direction and in the presence of Amy Cheadle, MD. Electronically Signed: [name], [place]. 03/18/2018 , 4:02 PM .  Patient was seen in Room 3 .   Patient ID: [name], female    DOB: 05/27/1956, 62 y.o.   MRN: [id number] Chief Complaint  Patient presents with  . Anxiety  . Gastroesophageal Reflux  . Medication Refill    all medications    HPI Amy Phillips is a 62 y.o. female who presents to Primary Care at [facility] for medication refill. She's scheduled to have Mo's surgery, done by Dr. Marla Roe. She also had skin cancers removed from her back, done by Dr. Renda Rolls. She's not fasting today.   She's still having occasional PAC's and PVC's, especially when she's quiet and going to sleep. She states it doesn't happened every night, but it's very frequent. She was on Holter monitor in 2012, and her palpitations have been about the same; thought caused by night shift and anxiety. She stopped working night shift, and had improvement. She only takes a tablet of propranolol when it's gotten very bad; last filled for 30 tablets, and still has 4 left. She takes it less than twice a month.   Her anxiety is well controlled with rare use of xanax. She uses Qvar 1-2 puffs bid, most of the time 2 puffs bid at morning and night; if she's feeling great, just 1 puff. She also takes Zyrtec 10 mg qhs. She takes Nexium 40 mg for heartburn; tried Zantac and Pepcid in the past. She had more nausea after her hysterectomy, with vomiting at the day of her discharge at that time; has been Nexium ever since. She notes due for colonoscopy, previously done by Dr. Collene Mares. Valtrex was recently refilled by Dr. Renda Rolls. She would also like to have her liver function rechecked.   She declined HIV and Hep C, as it was checked with both of her children; and 2 miscarriages was checked negative. 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   Past Medical History:  Diagnosis Date  . Allergy   . Anxiety   . Arrhythmia    PVCs- no meds  . Asthma   . Basal cell carcinoma of nose 08/19/2017  . Diverticulitis 08/05/2012   Dr Collene Mares  . GERD (gastroesophageal reflux disease)   . Ovarian cyst    recurrent  . PVC's (premature ventricular contractions)   . Uterine fibroid 2006   16 week size   Past Surgical History:  Procedure Laterality Date  . ABDOMINAL HYSTERECTOMY N/A 06/12/2015   Procedure: HYSTERECTOMY ABDOMINAL With Vertical Incision;  Surgeon: Eldred Manges, MD;  . BILATERAL SALPINGECTOMY  06/12/2015   Procedure: BILATERAL SALPINGECTOMY;  Surgeon: Eldred Manges, MD;  Location: [facility];  Service: Gynecology;;  . DILATATION & CURETTAGE/HYSTEROSCOPY WITH TRUECLEAR N/A 09/08/2013   Procedure: DILATATION & CURETTAGE/HYSTEROSCOPY WITH TRUCLEAR;  Surgeon: Azalia Bilis, MD;  Location: [facility];  Service: Gynecology;  Laterality: N/A;  . DILATION AND CURETTAGE OF UTERUS     D&E x2 missed ab  . UPPER GI ENDOSCOPY  07/23/12   grade 1 esophagitis noted:  otherwise normal esophagogastrduodenoscopy   Prior to Admission medications   Medication Sig Start Date End Date Taking? Authorizing Provider  ALPRAZolam (XANAX) 0.25 MG tablet Take 1 tablet (0.25 mg total) by mouth at bedtime as  needed for anxiety or sleep. **Needs office visit for any additional refills** 06/26/17   Shawnee Knapp, MD  Beclomethasone Diprop HFA (QVAR REDIHALER) 80 MCG/ACT AERB Inhale 2 puffs into the lungs 2 (two) times daily. 02/18/17   Shawnee Knapp, MD  cetirizine (ZYRTEC) 10 MG tablet Take 20 mg by mouth daily.    [provider]  doxycycline (VIBRA-TABS) 100 MG tablet Take 100 mg by mouth 2 (two) times daily.    [provider]  esomeprazole (NEXIUM) 40 MG capsule Take 40 mg by mouth daily at 12 noon.    [provider]  estradiol (ESTRACE) 0.1 MG/GM  vaginal cream INSERT 0 5 GM VAGINALLY TWICE A WEEK AS DIRECTED 11/16/17   Haygood, Seymour Bars, MD  fluticasone (FLONASE) 50 MCG/ACT nasal spray Place 2 sprays into both nostrils as needed for allergies. 03/11/16   Shawnee Knapp, MD  ondansetron (ZOFRAN ODT) 4 MG disintegrating tablet Take 1 tablet (4 mg total) by mouth every 8 (eight) hours as needed for nausea or vomiting. 12/07/17   Withrow, Elyse Jarvis, FNP  propranolol (INDERAL) 40 MG tablet TAKE 1 TABLET BY MOUTH 4 TIMES DAILY AS NEEDED. 11/06/16   Shawnee Knapp, MD  valACYclovir (VALTREX) 1000 MG tablet Take 1 tablet (1,000 mg total) by mouth daily. For 5 days at first sign of outbreak 03/11/16   Shawnee Knapp, MD  VENTOLIN HFA 108 [phone] Base) MCG/ACT inhaler INHALE 2 PUFFS EVERY 4-6 HRS AS NEEDED. 02/11/17   Shawnee Knapp, MD   Allergies  Allergen Reactions  . Peanuts [Peanut Oil] Other (See Comments)    Mood changes per pt and husband   . Shellfish Allergy Other (See Comments)    Throat irritation  . Strawberry (Diagnostic)    Family History  Problem Relation Age of Onset  . Arthritis Mother   . Osteoporosis Mother   . Breast cancer Mother   . COPD Mother   . Lung cancer Mother   . COPD Father   . Heart disease Father   . Lung cancer Father   . Hypertension Sister   . Heart disease Sister   . Arthritis Sister   . Hypertension Brother    Social History   Socioeconomic History  . Marital status: Married    Spouse name: Not on file  . Number of children: 2  . Years of education: Not on file  . Highest education level: Not on file  Occupational History  . Not on file  Social Needs  . Financial resource strain: Not on file  . Food insecurity:    Worry: Not on file    Inability: Not on file  . Transportation needs:    Medical: Not on file    Non-medical: Not on file  Tobacco Use  . Smoking status: Never Smoker  . Smokeless tobacco: Never Used  Substance and Sexual Activity  . Alcohol use: Yes    Comment: social/occasional  . Drug  use: No  . Sexual activity: Yes    Partners: Male    Birth control/protection: Surgical, Post-menopausal  Lifestyle  . Physical activity:    Days per week: Not on file    Minutes per session: Not on file  . Stress: Not on file  Relationships  . Social connections:    Talks on phone: Not on file    Gets together: Not on file    Attends religious service: Not on file    Active member of club or  organization: Not on file    Attends meetings of clubs or organizations: Not on file    Relationship status: Not on file  Other Topics Concern  . Not on file  Social History Narrative  . Not on file   Depression screen [facility] 2/9 03/18/2018 12/30/2016 03/11/2016 09/20/2015 06/01/2015  Decreased Interest 0 0 0 0 0  Down, Depressed, Hopeless 0 0 0 0 0  PHQ - 2 Score 0 0 0 0 0    Review of Systems  Constitutional: Negative for chills, fatigue, fever and unexpected weight change.  Respiratory: Negative for cough.   Cardiovascular: Positive for palpitations.  Gastrointestinal: Negative for constipation, diarrhea, nausea and vomiting.  Skin: Negative for rash and wound.  Neurological: Negative for dizziness, weakness and headaches.       Objective:   Physical Exam  Constitutional: She is oriented to person, place, and time. She appears well-developed and well-nourished. No distress.  HENT:  Head: Normocephalic and atraumatic.  Eyes: Pupils are equal, round, and reactive to light. EOM are normal.  Neck: Neck supple.  Cardiovascular: Normal rate.  Pulmonary/Chest: Effort normal. No respiratory distress.  Musculoskeletal: Normal range of motion.  Neurological: She is alert and oriented to person, place, and time.  Skin: Skin is warm and dry.  Psychiatric: She has a normal mood and affect. Her behavior is normal.  Nursing note and vitals reviewed.   BP 108/72   Pulse 87   Temp 98.8 F (37.1 C) (Oral)   Resp 16   Ht 5' 2.4" (1.585 m)   Wt 140 lb 3.2 oz (63.6 kg)   LMP  (LMP Unknown)    SpO2 97%   BMI 25.31 kg/m      Assessment & Plan:    1. Palpitations - ordered a cardiac event monitor - suspect PVCs or anxiety induced tachycardia but will r/o any more serious etiology w/ monitor - pt ok w/ this and declines cardiology referral for now which was also offered.  Pt currently asymptomatic so did not repeat EKG today.  2. Anxiety   3. Moderate persistent asthma without complication   4. Transaminitis - prrior - most likely fatty liver - recheck and if still elevated, will then order RUQ abd [place]  5. Routine screening for STI (sexually transmitted infection) - reviwed routine CDC rec  6. Screening for HIV (human immunodeficiency virus) - pt refused  7. Need for hepatitis C screening test - pt refused  8. Mixed hyperlipidemia - not fasting today - placed order for future labs so she can come in for labonly visit when fasting in next few mos    Orders Placed This Encounter  Procedures  . Hepatic Function Panel  . Lipid panel    Standing Status:   Future    Standing Expiration Date:   03/19/2019    Order Specific Question:   Has the patient fasted?    Answer:   Yes  . Cardiac event monitor    Standing Status:   Future    Standing Expiration Date:   03/19/2019    Meds ordered this encounter  Medications  . propranolol (INDERAL) 40 MG tablet    Sig: Take 1 tablet (40 mg total) by mouth 4 (four) times daily.    Dispense:  30 tablet    Refill:  5  . ALPRAZolam (XANAX) 0.25 MG tablet    Sig: Take 1 tablet (0.25 mg total) by mouth at bedtime as needed for anxiety or sleep.  Dispense:  30 tablet    Refill:  1  . beclomethasone (QVAR REDIHALER) 80 MCG/ACT inhaler    Sig: Inhale 2 puffs into the lungs 2 (two) times daily.    Dispense:  1 Inhaler    Refill:  11    Pt does not need currently. Please add this on as refills to current rx.  . esomeprazole (NEXIUM) 20 MG capsule    Sig: Take 1 capsule (20 mg total) by mouth 2 (two) times daily before a meal.    Dispense:   180 capsule    Refill:  3  . VENTOLIN HFA 108 (90 Base) MCG/ACT inhaler    Sig: INHALE 2 PUFFS EVERY 4-6 HRS AS NEEDED.    Dispense:  18 g    Refill:  11    Pt does not need currently. Please add this on as refills to current rx.    Amy Cheadle, MD, MPH Primary [facility] at [facility] [address] [place], [place]  [address] [phone] Office phone  [phone] Office fax   06/18/18 5:49 PM

## 2018-03-18 NOTE — Patient Instructions (Addendum)
If LFTs still elevated, drop by the office for a fasting cholesterol check and we will order a right upper quadrant abdominal ultrasound. If you don't hear about scheduling the event monitor within the next 2-3 weeks, please call our referrals team - sometimes orders get sent to [facility] work queues that aren't being "worked."  I would like you to come by the office for a "lab-only" visit at your convenience.  You may walk-in to the [address] appointment clinic any time (M-F 8-6, Sat 8-4) to have your blood drawn without an appointment as I have already ordered the "future" labs in your chart. Make sure to let the front desk know that you are there for a lab-only visit and that you do not need to see a provider.  You do need to be fasting.     IF you received an x-ray today, you will receive an invoice from [facility]. Please contact [facility] at [phone] with questions or concerns regarding your invoice.   IF you received labwork today, you will receive an invoice from [facility]. Please contact [facility] at [phone] with questions or concerns regarding your invoice.   Our billing staff will not be able to assist you with questions regarding bills from these companies.  You will be contacted with the lab results as soon as they are available. The fastest way to get your results is to activate your My Chart account. Instructions are located on the last page of this paperwork. If you have not heard from [place] regarding the results in 2 weeks, please contact this office.     Liver Function Tests Liver function tests are blood tests to see how well your liver is working. The proteins and enzymes measured in the test can alert your health care provider to inflammation, damage, or disease in your liver. It is common to have liver function tests:  During annual physical exams.  When you are taking certain medicines.  If you have liver disease.  If you drink a lot of  alcohol.  When you are not feeling well.  When you have other conditions that may affect the liver.  Substances measured may include:  Alanine transaminase (ALT). This is an enzyme in the liver.  Aspartate transaminase (AST). This is an enzyme in the liver, heart, and muscles.  Alkaline phosphatase (ALP). This is a protein in the liver, bile ducts, and bone. It is also in other body tissues.  Total bilirubin. This is a yellow pigment in bile.  Albumin. This is a protein in the liver.  Prothrombin time and international normalized ratio (PT and INR). PT measures the time that it takes for your blood to clot. INR is a calculation of blood clotting time based upon your PT result. It is also calculated based on normal ranges defined by the laboratory that processed your lab test.  Total protein. This measures two proteins, albumin and globulin, found in the blood.  How do I prepare for this test? How you prepare will depend on which tests are being done and the reason why these tests are being done. You may need to:  Avoid eating for 4-6 hours before the test or as directed by your health care provider.  Stop taking certain medicines prior to your blood test as directed by your health care provider.  What do the results mean? It is your responsibility to obtain your test results. Ask the lab or department performing the test when and how you will  get your results. Contact your health care provider to discuss any questions you have about your results. RANGE OF NORMAL VALUES Ranges for normal values may vary among different labs and hospitals. You should always check with your health care provider after having lab work or other tests done to discuss the meaning of your test results and whether your values are considered within normal limits. The following are normal ranges for substances measured in liver function tests: ALT  Infant: may be twice as high as adult values.  Child or  adult: 4-36 international units/L at 37C or 4-36 units/L (SI units).  Elderly: may be slightly higher than adult values. AST  Newborn 66-86 days old: 35-140 units/L.  Child under 96 years old: 15-60 units/L.  71-65 years old: 15-50 units/L.  55-40 years old: 10-50 units/L.  21-17 years old: 10-40 units/L.  Adult: 0-35 units/L or 0-0.58 microkatal/L (SI units).  Elderly: slightly higher than adults. ALP  Child under 42 years old: 85-235 units/L.  25-1 years old: 65-210 units/L.  41-10 years old: 60-300 units/L.  78-23 years old: 30-200 units/L.  Adult: 30-120 units/L or 0.5-2.0 microkatal/L (SI units).  Elderly: slightly higher than adult. Total bilirubin  Newborn: 1.0-12.0 mg/dL or 17.1-205 micromoles/L (SI units).  Adult, elderly, or child: 0.3-1.0 mg/dL or 5.1-17 micromoles/L. Albumin  Premature infant: 3.0-4.2 g/dL.  Newborn: 3.5-5.4 g/dL.  Infant: 4.4-5.4 g/dL.  Child: 4.0-5.9 g/dL.  Adult or elderly: 3.5-5.0 g/dL or 35-50 g/L (SI units). PT  11.0-12.5 seconds; 85%-100%. INR  0.8-1.1. Total protein  Premature infant: 4.2-7.6 g/dL.  Newborn: 4.6-7.4 g/dL.  Infant: 6.0-6.7 g/dL.  Child: 6.2-8.0 g/dL.  Adult or elderly: 6.4-8.3 g/dL or 64-83 g/L (SI units). MEANING OF RESULTS OUTSIDE NORMAL VALUE RANGES Sometimes test results can be abnormal due to other factors, such as medicines, exercise, or pregnancy. Follow up with your health care provider if you have any questions about test results outside the normal value ranges. ALT  Levels above the normal range, along with other test results, may indicate liver disease. AST  Levels above the normal range, along with other test results, may indicate liver disease. Sometimes levels also increase after burns, surgery, heart attack, muscle damage, or seizure. ALP  Levels above the normal range, along with other test results, may indicate biliary obstruction, diseases of the liver, bone disease, thyroid  disease, tumors, fractures, leukemia or lymphoma, or several other conditions. People with blood type O or B may show higher levels after a fatty meal.  Levels below the normal range, along with other test results, may indicate bone and teeth conditions, malnutrition, protein deficiency, or Wilson disease. Total bilirubin  Levels above the normal range, along with other test results, may indicate problems with the liver, gallbladder, or bile ducts. Albumin  Levels above the normal range, along with other test results, may indicate dehydration. They may also be caused by a diet that is high in protein. Sometimes, the band placed around the upper arm during the process of drawing blood can cause the level of this protein in your blood to rise and give you a result above the normal range.  Levels below the normal range, along with other tests results, may indicate kidney disease, liver disease, or malabsorption of nutrients. PT and INR  Levels above the normal range mean your blood is clotting slower than normal. This may be due to blood disorders, liver disorders, or low levels of vitamin K. Total protein  Levels above the normal range, along  with other test results, may be due to infection or other diseases.  Levels below the normal range, along with other test results, may be due to an immune system disorder, bleeding, burns, kidney disorder, liver disease, trouble absorbing or getting enough nutrients, or other conditions that affect the intestines. Talk with your health care provider to discuss your results, treatment options, and if necessary, the need for more tests. Talk with your health care provider if you have any questions about your results. This information is not intended to replace advice given to you by your health care provider. Make sure you discuss any questions you have with your health care provider. Document Released: 10/11/2004 Document Revised: 05/14/2016 Document Reviewed:  01/12/2014 Elsevier Interactive Patient Education  2018 [place].  Fatty Liver Fatty liver, also called hepatic steatosis or steatohepatitis, is a condition in which too much fat has built up in your liver cells. The liver removes harmful substances from your bloodstream. It produces fluids your body needs. It also helps your body use and store energy from the food you eat. In many cases, fatty liver does not cause symptoms or problems. It is often diagnosed when tests are being done for other reasons. However, over time, fatty liver can cause inflammation that may lead to more serious liver problems, such as scarring of the liver (cirrhosis). What are the causes? Causes of fatty liver may include:  Drinking too much alcohol.  Poor nutrition.  Obesity.  Cushing syndrome.  Diabetes.  Hyperlipidemia.  Pregnancy.  Certain drugs.  Poisons.  Some viral infections.  What increases the risk? You may be more likely to develop fatty liver if you:  Abuse alcohol.  Are pregnant.  Are overweight.  Have diabetes.  Have hepatitis.  Have a high triglyceride level.  What are the signs or symptoms? Fatty liver often does not cause any symptoms. In cases where symptoms develop, they can include:  Fatigue.  Weakness.  Weight loss.  Confusion.  Abdominal pain.  Yellowing of your skin and the white parts of your eyes (jaundice).  Nausea and vomiting.  How is this diagnosed? Fatty liver may be diagnosed by:  Physical exam and medical history.  Blood tests.  Imaging tests, such as an ultrasound, CT scan, or MRI.  Liver biopsy. A small sample of liver tissue is removed using a needle. The sample is then looked at under a microscope.  How is this treated? Fatty liver is often caused by other health conditions. Treatment for fatty liver may involve medicines and lifestyle changes to manage conditions such as:  Alcoholism.  High  cholesterol.  Diabetes.  Being overweight or obese.  Follow these instructions at home:  Eat a healthy diet as directed by your health care provider.  Exercise regularly. This can help you lose weight and control your cholesterol and diabetes. Talk to your health care provider about an exercise plan and which activities are best for you.  Do not drink alcohol.  Take medicines only as directed by your health care provider. Contact a health care provider if: You have difficulty controlling your:  Blood sugar.  Cholesterol.  Alcohol consumption.  Get help right away if:  You have abdominal pain.  You have jaundice.  You have nausea and vomiting. This information is not intended to replace advice given to you by your health care provider. Make sure you discuss any questions you have with your health care provider. Document Released: 10/24/2005 Document Revised: 02/14/2016 Document Reviewed: 01/18/2014 Elsevier Interactive  Patient Education  [employer].

## 2018-03-19 LAB — HEPATIC FUNCTION PANEL
ALT: 31 IU/L (ref 0–32)
AST: 15 IU/L (ref 0–40)
Albumin: 4.9 g/dL — ABNORMAL HIGH (ref 3.6–4.8)
Alkaline Phosphatase: 69 IU/L (ref 39–117)
Bilirubin Total: <0.2 mg/dL (ref 0.0–1.2)
Bilirubin, Direct: 0.06 mg/dL (ref 0.00–0.40)
Total Protein: 6.9 g/dL (ref 6.0–8.5)

## 2018-04-01 ENCOUNTER — Encounter: Payer: Self-pay | Admitting: Family Medicine

## 2018-04-08 ENCOUNTER — Encounter: Payer: Self-pay | Admitting: Family Medicine

## 2018-04-12 MED ORDER — ESOMEPRAZOLE MAGNESIUM 20 MG PO CPDR: DELAYED_RELEASE_CAPSULE | ORAL | 3 refills | Status: DC

## 2018-04-12 MED FILL — ESOMEPRAZOLE MAG DR 20 MG C: 20 | 90 days supply | Qty: 90 | Fill #0

## 2018-06-03 MED FILL — ESTRADIOL 0.1 MG/GM CRM: 0.1 | 90 days supply | Qty: 43 | Fill #1

## 2018-06-09 MED FILL — valACYclovir HCL 1 GM TABS: 1 | 8 days supply | Qty: 30 | Fill #0

## 2018-06-18 ENCOUNTER — Encounter: Payer: Self-pay | Admitting: Family Medicine

## 2018-06-18 MED ORDER — ESOMEPRAZOLE MAGNESIUM 40 MG PO CPDR: 40.0000 mg | DELAYED_RELEASE_CAPSULE | Freq: Every day | ORAL | 3 refills | Status: DC

## 2018-06-18 NOTE — Progress Notes (Signed)
Ms. Folson has reached out to me in regards to the Provider Referral Exercise Program (PREP) at the [facility].  One of her co-workers is participating in the program and has shared her experiences.  I have been emailing back and forth w/Ms. Bacha about the program and she is interested in starting on Oct 15th w/the [facility] group.  I will be emailing her PMD to make sure she is cleared to participate.

## 2018-06-21 MED FILL — ESOMEPRAZOLE MAG DR 40 MG C: 40 | 90 days supply | Qty: 90 | Fill #0

## 2018-06-23 ENCOUNTER — Telehealth: Payer: Self-pay

## 2018-06-23 MED FILL — QVAR REDIHALER 80 MCG/ACT A: 80 | 30 days supply | Qty: 11 | Fill #0

## 2018-06-23 NOTE — Telephone Encounter (Signed)
TC to [place], advised of order for Cardiac Event monitor.  Pt scheduled there for 10/10 at 8:30 but pt can change appt if that does not suit her schedule.  Pt notified and will be calling [facility] to change appt.  Pt states she needs refill of Qvar.  Advised that Rx was sent 06/27 that had 11 refills.  Pt to check at pharmacy for refill and pharmacy will send request if needed.  Pt stated she will also be calling back to schedule f/u with Dr. Brigitte Pulse.

## 2018-06-24 DIAGNOSIS — L910 Hypertrophic scar: Secondary | ICD-10-CM | POA: Diagnosis not present

## 2018-06-24 DIAGNOSIS — B351 Tinea unguium: Secondary | ICD-10-CM | POA: Diagnosis not present

## 2018-06-24 DIAGNOSIS — Z23 Encounter for immunization: Secondary | ICD-10-CM | POA: Diagnosis not present

## 2018-06-29 NOTE — Progress Notes (Signed)
[place] Report   Patient Details  Name: Amy Phillips MRN: [id number] Date of Birth: 1956-08-29 Age: 62 y.o. PCP: Shawnee Knapp, MD  Vitals:   06/29/18 1246  BP: (!) 142/84  Pulse: 75  Resp: 18  SpO2: 98%  Weight: 142 lb 9.6 oz (64.7 kg)     [facility] Eval - 06/29/18 1200      Referral    Referring Provider  Self/Dr. Brigitte Pulse    Reason for referral  Inactivity;Orthopedic    Program Start Date  07/06/18      Measurement   Waist Circumference  35 inches    Hip Circumference  38 inches    Body fat  35.8 percent      Information for Trainer   Goals  "orient to the [facility] & equipment to improve & strengthen mm & flexibility"    Current Exercise  "walking at work, parks farther and walks to destination"    Orthopedic Concerns  LT hip pain    Pertinent Medical History  asthma, benign arrhythmias, hysterectomy 2016    Current Barriers  none    Medications that affect exercise  Asthma inhaler      Mobility and Daily Activities   I find it easy to walk up or down two or more flights of stairs.  3    I have no trouble taking out the trash.  4    I do housework such as vacuuming and dusting on my own without difficulty.  4    I can easily lift a gallon of milk (8lbs).  4    I can easily walk a mile.  2    I have no trouble reaching into high cupboards or reaching down to pick up something from the floor.  2    I do not have trouble doing out-door work such as Armed forces logistics/support/administrative officer, raking leaves, or gardening.  3      Mobility and Daily Activities   I feel younger than my age.  3    I feel independent.  4    I feel energetic.  3    I live an active life.   4    I feel strong.  3    I feel healthy.  3    I feel active as other people my age.  4      How fit and strong are you.   Fit and Strong Total Score  46      Past Medical History:  Diagnosis Date  . Allergy   . Anxiety   . Arrhythmia    PVCs- no meds  . Asthma   . Basal cell carcinoma of nose 08/19/2017   . Diverticulitis 08/05/2012   Dr Collene Mares  . GERD (gastroesophageal reflux disease)   . Ovarian cyst    recurrent  . PVC's (premature ventricular contractions)   . Uterine fibroid 2006   16 week size   Past Surgical History:  Procedure Laterality Date  . ABDOMINAL HYSTERECTOMY N/A 06/12/2015   Procedure: HYSTERECTOMY ABDOMINAL With Vertical Incision;  Surgeon: Eldred Manges, MD;  . BILATERAL SALPINGECTOMY  06/12/2015   Procedure: BILATERAL SALPINGECTOMY;  Surgeon: Eldred Manges, MD;  Location: [facility];  Service: Gynecology;;  . DILATATION & CURETTAGE/HYSTEROSCOPY WITH TRUECLEAR N/A 09/08/2013   Procedure: DILATATION & CURETTAGE/HYSTEROSCOPY WITH TRUCLEAR;  Surgeon: Azalia Bilis, MD;  Location: [facility];  Service: Gynecology;  Laterality: N/A;  . DILATION AND CURETTAGE OF UTERUS  D&E x2 missed ab  . UPPER GI ENDOSCOPY  07/23/12   grade 1 esophagitis noted:  otherwise normal esophagogastrduodenoscopy   Social History   Tobacco Use  Smoking Status Never Smoker  Smokeless Tobacco Never Used    Amy Phillips will begin her 12 week PREP at [facility] on Tues/Thurs from 11:30-12:30   Amy Phillips 06/29/2018, 12:50 PM

## 2018-07-05 DIAGNOSIS — H35363 Drusen (degenerative) of macula, bilateral: Secondary | ICD-10-CM | POA: Diagnosis not present

## 2018-07-05 DIAGNOSIS — H25013 Cortical age-related cataract, bilateral: Secondary | ICD-10-CM | POA: Diagnosis not present

## 2018-07-05 DIAGNOSIS — H2513 Age-related nuclear cataract, bilateral: Secondary | ICD-10-CM | POA: Diagnosis not present

## 2018-07-05 DIAGNOSIS — D3132 Benign neoplasm of left choroid: Secondary | ICD-10-CM | POA: Diagnosis not present

## 2018-07-05 MED FILL — RESTASIS 0.05% EYE EMULSION: 0.05 | 90 days supply | Qty: 180 | Fill #0

## 2018-07-12 ENCOUNTER — Ambulatory Visit (INDEPENDENT_AMBULATORY_CARE_PROVIDER_SITE_OTHER): Payer: 59

## 2018-07-12 DIAGNOSIS — R002 Palpitations: Secondary | ICD-10-CM

## 2018-07-12 MED FILL — ALPRAZolam 0.25 MG TABS: 0.25 | 30 days supply | Qty: 30 | Fill #1

## 2018-07-12 NOTE — Telephone Encounter (Signed)
Patient contacted office.  Was set up with 30 day cardiac monitor but was expecting a monitor for 24-48 hours.  Would like to know if she needs to wear it for 30 days as she is not having any cardiac issues and is not wanting to commit to 30 days, especially considering that she is now participating in the exercise program.  Please advise.    Please respond to clinical pool if further instructions or questions are needed.   Thank you.

## 2018-07-14 NOTE — Progress Notes (Signed)
[facility] PREP Weekly Session   Patient Details  Name: Amy Phillips MRN: [id number] Date of Birth: [birth year] Age: 62 y.o. PCP: Shawnee Knapp, MD  Vitals:   07/13/18 1452  Weight: 142 lb 12.8 oz (64.8 kg)    [facility] [employer] Weekly seesion - 07/14/18 1400      Weekly Session   Topic Discussed  Water;Health habits    Minutes exercised this week  10 minutes   cardio     Fun things you did since last meeting:"rested over the weekend" Things you are grateful for:"being alive & being independent" Nutrition celebrations:"no added sugars" Barriers:"taking time for myself"  [name] 07/14/2018, 2:52 PM

## 2018-07-23 NOTE — Progress Notes (Signed)
[facility] PREP Weekly Session   Patient Details  Name: KEANI GOTCHER MRN: [id number] Date of Birth: 10-12-1955 Age: 62 y.o. PCP: Shawnee Knapp, MD  Vitals:   07/20/18 1108  Weight: 143 lb 9.6 oz (65.1 kg)    [facility] [employer] Weekly seesion - 07/23/18 1100      Weekly Session   Topic Discussed  Other ways to be active    Minutes exercised this week  30 minutes   cardio     Fun things you did since last meeting:"Grand opening childrens playroom at hospital" Things you are grateful for:"my husband, children, pet, especially life!" Nutrition celebrations:"drink more water"  [name] 07/23/2018, 11:08 AM

## 2018-07-26 NOTE — Telephone Encounter (Signed)
No - she does not have to wear the monitor for the full 30d - just catch a few examples of the palpitations she is experiencing so the cardiologist can see what her heart rhythm is doing during those, then can remove and return it whenever she feels comfortable.  ------ At last visit, I documented that pt reported her palpitations weren't occurring every night - were "occasional" but recently more frequent but only severe ~2x/mo. Therefore, it seemed quite possible that she might not have any of the palpitations she is experiencing during just a 24-48 hr continual record of her heart rhythm that a holter monitor would provide.  Using an event monitor where she can start the recording only while symptomatic seems MUCH more likely to ensure records of the palpitations were captured and allows her to stop the test after whatever amount of time she feels has been representative.  The more episodes we catch and/or catching a recording of her rhythm during one of the more severe episodes will allow for proportional more reassurance to pt that she has been adequately assessed and accurately diagnosed.  However, she doesn't HAVE to do anything and she is welcome to remove and return the monitor whenever she feels study has been able to record adequate data (as without this there wouldn't have been much of a point to doing it in the first place).

## 2018-07-27 NOTE — Telephone Encounter (Signed)
Spoke with patient regarding heart monitor, and Dr. Raul Del message . She stated that she has been wearing it for 2 weeks, and it is "really" tearing at her chest, "skin irritated." I have been okay, still having some palpitations. I will make an appointment with Dr. Brigitte Pulse in 2 weeks.

## 2018-07-27 NOTE — Progress Notes (Signed)
[facility] [employer] PREP Weekly Session   Patient Details  Name: Amy Phillips MRN: [id number] Date of Birth: 02/14/1956 Age: 62 y.o. PCP: Shawnee Knapp, MD  Vitals:   07/27/18 1941  Weight: 142 lb 3.2 oz (64.5 kg)    [facility] Weekly seesion - 07/27/18 1900      Weekly Session   Topic Discussed  Health habits;Water    Minutes exercised this week  15 minutes   cardio/also did strength training last week     Fun things you did since last meeting:"rested" Things you are grateful for:"my new dog settled well" Nutrition celebrations:"drank more water" Barriers:"back issue"  [name] 07/27/2018, 7:42 PM

## 2018-07-29 NOTE — Telephone Encounter (Signed)
Amy Phillips - does pt know it is ok to pt discontinue the monitor now since it has caught some and is causing so much irritation? Want to make sure she doesn't think she needs to suffer for another 2 wks - thanks!!

## 2018-08-06 NOTE — Progress Notes (Signed)
Fun things you did since last meeting:"Busy weekend, no fun" Things you are grateful for:"getting healthy" Nutrition celebrations:"more water again" Barriers:"busly life prevents me to cook at home"

## 2018-08-09 NOTE — Telephone Encounter (Signed)
LMOM pt made aware she may discontinue use of cardiac monitor.

## 2018-08-11 NOTE — Progress Notes (Signed)
[facility] PREP Weekly Session   Patient Details  Name: Amy Phillips MRN: [id number] Date of Birth: [birth year] Age: 62 y.o. PCP: Shawnee Knapp, MD  There were no vitals filed for this visit.  [facility] Weekly seesion - 08/11/18 1400      Weekly Session   Topic Discussed  Stress management and problem solving    Minutes exercised this week  --   cardio/strength/flexibility     Fun things you did since last meeting:"drove to [place] to visit my son." Things you are grateful for:"being alive" Nutrition celebrations:"cooked at home more this week" Barriers:"work/stress"  [name] 08/11/2018, 2:58 PM

## 2018-08-23 MED FILL — QVAR REDIHALER 80 MCG/ACT A: 80 | 30 days supply | Qty: 11 | Fill #1

## 2018-08-25 MED FILL — DOXYCYCLINE HYC 100 MG CAPS: 100 | 90 days supply | Qty: 90 | Fill #0

## 2018-08-25 NOTE — Progress Notes (Signed)
[facility] PREP Weekly Session   Patient Details  Name: Amy Phillips MRN: [id number] Date of Birth: July 05, 1956 Age: 62 y.o. PCP: Shawnee Knapp, MD  Vitals:   08/24/18 1454  Weight: 142 lb 12.8 oz (64.8 kg)    [facility] [employer] Weekly seesion - 08/25/18 1400      Weekly Session   Topic Discussed  Importance of resistance training      Things you are grateful for:"life" Nutrition celebrations:"can't drink more water" Barriers:"overwhelmed and anxiety"   [name] 08/25/2018, 2:54 PM

## 2018-10-06 DIAGNOSIS — N952 Postmenopausal atrophic vaginitis: Secondary | ICD-10-CM | POA: Diagnosis not present

## 2018-10-06 DIAGNOSIS — F419 Anxiety disorder, unspecified: Secondary | ICD-10-CM | POA: Diagnosis not present

## 2018-10-06 DIAGNOSIS — Z01419 Encounter for gynecological examination (general) (routine) without abnormal findings: Secondary | ICD-10-CM | POA: Diagnosis not present

## 2018-10-06 DIAGNOSIS — Z6826 Body mass index (BMI) 26.0-26.9, adult: Secondary | ICD-10-CM | POA: Diagnosis not present

## 2018-10-07 MED FILL — ESTRADIOL 0.1 MG/GM CREA: 0.1 | 90 days supply | Qty: 43 | Fill #0

## 2018-10-12 MED FILL — QVAR REDIHALER 80 MCG/ACT A: 80 | 30 days supply | Qty: 11 | Fill #2

## 2018-10-22 MED FILL — ESOMEPRAZOLE MAG DR 40 MG C: 40 | 90 days supply | Qty: 90 | Fill #1

## 2018-10-26 MED FILL — valACYclovir HCL 1 GM TABS: 1 | 8 days supply | Qty: 30 | Fill #0

## 2018-11-16 DIAGNOSIS — Z1231 Encounter for screening mammogram for malignant neoplasm of breast: Secondary | ICD-10-CM | POA: Diagnosis not present

## 2018-11-16 DIAGNOSIS — Z1213 Encounter for screening for malignant neoplasm of small intestine: Secondary | ICD-10-CM | POA: Diagnosis not present

## 2018-11-19 ENCOUNTER — Encounter: Payer: Self-pay | Admitting: Family Medicine

## 2018-11-22 ENCOUNTER — Telehealth: Payer: Self-pay | Admitting: Family Medicine

## 2018-11-22 NOTE — Telephone Encounter (Signed)
Copied from [place] [phone]. Topic: Quick Communication - Rx Refill/Question >> Nov 22, 2018 12:42 PM Gustavus Messing wrote: Medication: ALPRAZolam Duanne Moron) 0.25 MG tablet   Has the patient contacted their pharmacy? No. (Agent: If no, request that the patient contact the pharmacy for the refill.) Patient has emailed Dr. Brigitte Pulse and is getting no response. She has to travel and only wants 5 pills until she returns and will make an appointment when she returns   Preferred Pharmacy (with phone number or street name): [place], [place] - [address] [phone] (Phone) [phone] (Fax)    Agent: Please be advised that RX refills may take up to 3 business days. We ask that you follow-up with your pharmacy.

## 2018-11-22 NOTE — Telephone Encounter (Signed)
Xanax refill request.  

## 2018-11-23 NOTE — Telephone Encounter (Signed)
Please advise 

## 2018-12-16 ENCOUNTER — Telehealth: Payer: 59 | Admitting: Family

## 2018-12-16 DIAGNOSIS — J45909 Unspecified asthma, uncomplicated: Secondary | ICD-10-CM

## 2018-12-16 DIAGNOSIS — J4540 Moderate persistent asthma, uncomplicated: Secondary | ICD-10-CM

## 2018-12-16 MED ORDER — VENTOLIN HFA 108 (90 BASE) MCG/ACT IN AERS: INHALATION_SPRAY | RESPIRATORY_TRACT | 0 refills | Status: DC

## 2018-12-16 MED ORDER — PREDNISONE 20 MG PO TABS: 40.0000 mg | ORAL_TABLET | Freq: Every day | ORAL | 0 refills | Status: DC

## 2018-12-16 MED FILL — predniSONE 20 MG TABS: 20 | 5 days supply | Qty: 10 | Fill #0

## 2018-12-16 MED FILL — VENTOLIN HFA 90 MCG INHALER: 108 (90 BAS | 17 days supply | Qty: 18 | Fill #0

## 2018-12-16 NOTE — Progress Notes (Signed)
E Visit for Asthma  Based on what you have shared with me, it looks like you may have a flare up of your asthma.  Asthma is a chronic (ongoing) lung disease which results in airway obstruction, inflammation and hyper-responsiveness.   Asthma symptoms vary from person to person, with common symptoms including nighttime awakening and decreased ability to participate in normal activities as a result of shortness of breath. It is often triggered by changes in weather, changes in the season, changes in air temperature, or inside (home, school, daycare or work) allergens such as animal dander, mold, mildew, woodstoves or cockroaches.   It can also be triggered by hormonal changes, extreme emotion, physical exertion or an upper respiratory tract illness.     It is important to identify the trigger, and then eliminate or avoid the trigger if possible.   If you have been prescribed medications to be taken on a regular basis, it is important to follow the asthma action plan and to follow guidelines to adjust medication in response to increasing symptoms of decreased peak expiratory flow rate  Treatment: I have prescribed: Albuterol (Proventil HFA; Ventolin HFA) 108 (90 Base) MCG/ACT Inhaler 2 puffs into the lungs every six hours as needed for wheezing or shortness of breath and Prednisone 40mg  by mouth per day for 5 - 7 days  HOME CARE . Only take medications as instructed by your medical team. . Consider wearing a mask or scarf to improve breathing air temperature have been shown to decrease irritation and decrease exacerbations . Get rest. . Taking a steamy shower or using a humidifier may help nasal congestion sand ease sore throat pain. You can place a towel over your head and breathe in the steam from hot water coming from a faucet. . Using a saline nasal spray works much the same way.   . Cough drops, hare candies and sore throat lozenges may ease your cough.  . Avoid close contacts especially the very you and the elderly . Cover your mouth if you cough or sneeze . Always remember to wash your hands.    GET HELP RIGHT AWAY IF: . You develop worsening symptoms; breathlessness at rest, drowsy, confused or agitated, unable to speak in full sentences . You have coughing fits . You develop a severe headache or visual changes . You develop shortness of breath, difficulty breathing or start having chest pain . Your symptoms persist after you have completed your treatment plan . If your symptoms do not improve within 10 days  MAKE SURE YOU . Understand these instructions. . Will watch your condition. . Will get help right away if you are not doing well or get worse.   Your e-visit answers were reviewed by a board certified advanced clinical practitioner to complete your personal care plan, Depending upon the condition, your plan could have included both over the counter or prescription medications.  Please review your pharmacy choice. Your safety is important to [place]. If you have drug allergies check your prescription carefully. You can use MyChart to ask questions about today's visit, request a non-urgent call back, or ask for a work or school excuse for 24 hours related to this e-Visit. If it has been greater than 24 hours you will need to follow up with your provider, or enter a new e-Visit to address those concerns.  You will get an e-mail in the next two days asking about your experience. I hope that your e-visit has been valuable and will speed your  recovery. Thank you for using e-visits.

## 2018-12-20 ENCOUNTER — Telehealth: Payer: 59 | Admitting: Family

## 2018-12-20 DIAGNOSIS — J4541 Moderate persistent asthma with (acute) exacerbation: Secondary | ICD-10-CM

## 2018-12-20 MED FILL — QVAR REDIHALER 80 MCG/ACT A: 80 | 30 days supply | Qty: 11 | Fill #0

## 2018-12-20 NOTE — Progress Notes (Signed)
Based on what you shared with me, I feel your condition warrants further evaluation and I recommend that you be seen for a face to face office visit.  Given that your symptoms did not resolve, you need to be seen face to face to be evaluated face to face to rule out other causes such as pneumonia.   Approximately 5 minutes was spent documenting and reviewing patient's chart.     NOTE: If you entered your credit card information for this eVisit, you will not be charged. You may see a "hold" on your card for the $35 but that hold will drop off and you will not have a charge processed.  If you are having a true medical emergency please call 911.  If you need an urgent face to face visit, [facility] has four urgent care centers for your convenience.    PLEASE NOTE: THE [facility] LOCATIONS AND URGENT CARE CLINICS DO NOT HAVE THE TESTING FOR CORONAVIRUS COVID19 AVAILABLE.  IF YOU FEEL YOU NEED THIS TEST YOU MUST GO TO A TRIAGE LOCATION AT [place]   [email or web address] to reserve your spot online an avoid wait times  [facility] [address], [address] [place], [place] [address] 8 am to 8 pm Monday-Friday 10 am to 4 pm Saturday-Sunday *Across the street from [employer]  [place], [address] 8 am to 5 pm Monday-Friday * In the [facility] on the [facility]   The following sites will take your insurance:  . [facility] [place] a Provider at this Location  [address] [place], [place] [address] . 10 am to 8 pm Monday-Friday . 12 pm to 8 pm Saturday-Sunday   . [facility] at [place] a Provider at this Location  [address] [place], [place], [place] [address] . 8 am to 8 pm Monday-Friday . 9 am to 6 pm Saturday . 11 am to 6 pm Sunday   .  [facility] at [place] Get Driving Directions  [address] [place], [place] [address] . 8 am to 8 pm Monday-Friday . 8 am to 4 pm Saturday-Sunday   Your e-visit answers were reviewed by a board certified advanced clinical practitioner to complete your personal care plan.  Thank you for using e-Visits.

## 2018-12-22 ENCOUNTER — Other Ambulatory Visit: Payer: Self-pay

## 2018-12-22 ENCOUNTER — Telehealth (INDEPENDENT_AMBULATORY_CARE_PROVIDER_SITE_OTHER): Payer: 59 | Admitting: Family Medicine

## 2018-12-22 DIAGNOSIS — F419 Anxiety disorder, unspecified: Secondary | ICD-10-CM

## 2018-12-22 DIAGNOSIS — J4521 Mild intermittent asthma with (acute) exacerbation: Secondary | ICD-10-CM

## 2018-12-22 MED ORDER — ALPRAZOLAM 0.25 MG PO TABS: 0.2500 mg | ORAL_TABLET | Freq: Every evening | ORAL | 0 refills | Status: DC | PRN

## 2018-12-22 MED ORDER — PREDNISONE 20 MG PO TABS: ORAL_TABLET | ORAL | 0 refills | Status: DC

## 2018-12-22 MED FILL — predniSONE 20 MG TABS: 20 | 9 days supply | Qty: 16 | Fill #0

## 2018-12-22 MED FILL — ALPRAZolam 0.25 MG TABS: 0.25 | 30 days supply | Qty: 30 | Fill #0

## 2018-12-22 NOTE — Progress Notes (Signed)
CC- Refill on prednisone and also would like a refill on alprazolam 0.25mg . Patient had e-visit on 12/16/18 for asthma was put on prednisone and thinks need another dose of prednisone to clear this all up. Patient do understand that Dr Brigitte Pulse is not coming back and stated you used to follow her before Dr Brigitte Pulse took over and would just stay here and have you to be her pcp.

## 2018-12-22 NOTE — Progress Notes (Signed)
Virtual Visit via Video Note  I connected with Amy Phillips on 12/22/18 at 1030 by a video enabled telemedicine application and verified that I am speaking with the correct person using two identifiers.   I discussed the limitations of evaluation and management by telemedicine and the availability of in person appointments. The patient expressed understanding and agreed to proceed.  CC: Asthma, anxiety.    History of Present Illness:  Asthma: evisit 3/26 Prednisone 40mg  QD for 5 days. Albuterol if needed. Had been using prn prior, but had some exposure to some chemical in [place] at a hotel on the 10th - noted asthma sx's right away - wheezing, dyspnea. Had albuterol - had been using albuterol every 4 hours, no fever.  No known sick contacts. no known exposure to coronavirus infected person. Has remained on Qvar, 2 puffs pBID, zyrtec and flonase.  Persistent shortness of breath. Used albuterol at 3 am, using albuterol every 4-6 hours. Does note some improvement after using Qvar - for about 45 minutes. Prednisone stabilized sx's, but not resolving.  Feels like asthma flair - required higher doses of prednisone and prolonged taper in past with allergist.  No confusion, no color change in fingers.  Finished prednisone. Has been at home - quarantined at home at this time.   Situational anxiety: Has used alprazolam infrequently - 1/2 tab if needed. Uses other coping techniques to manage as well.  Controlled substance database (PDMP) reviewed. No concerns appreciated. Last filled in 06/2018.    Past Medical History:  Diagnosis Date  . Allergy   . Anxiety   . Arrhythmia    PVCs- no meds  . Asthma   . Basal cell carcinoma of nose 08/19/2017  . Diverticulitis 08/05/2012   Dr Collene Mares  . GERD (gastroesophageal reflux disease)   . Ovarian cyst    recurrent  . PVC's (premature ventricular contractions)   . Uterine fibroid 2006   16 week size   Past Surgical History:  Procedure  Laterality Date  . ABDOMINAL HYSTERECTOMY N/A 06/12/2015   Procedure: HYSTERECTOMY ABDOMINAL With Vertical Incision;  Surgeon: Eldred Manges, MD;  . BILATERAL SALPINGECTOMY  06/12/2015   Procedure: BILATERAL SALPINGECTOMY;  Surgeon: Eldred Manges, MD;  Location: [facility];  Service: Gynecology;;  . DILATATION & CURETTAGE/HYSTEROSCOPY WITH TRUECLEAR N/A 09/08/2013   Procedure: DILATATION & CURETTAGE/HYSTEROSCOPY WITH TRUCLEAR;  Surgeon: Azalia Bilis, MD;  Location: [facility];  Service: Gynecology;  Laterality: N/A;  . DILATION AND CURETTAGE OF UTERUS     D&E x2 missed ab  . UPPER GI ENDOSCOPY  07/23/12   grade 1 esophagitis noted:  otherwise normal esophagogastrduodenoscopy   Allergies  Allergen Reactions  . Peanuts [Peanut Oil] Other (See Comments)    Mood changes per pt and husband   . Shellfish Allergy Other (See Comments)    Throat irritation  . Strawberry (Diagnostic)    Prior to Admission medications   Medication Sig Start Date End Date Taking? Authorizing Provider  beclomethasone (QVAR REDIHALER) 80 MCG/ACT inhaler Inhale 2 puffs into the lungs 2 (two) times daily. 03/18/18  Yes Shawnee Knapp, MD  cetirizine (ZYRTEC) 10 MG tablet Take 10 mg by mouth at bedtime.    Yes [provider]  esomeprazole (NEXIUM) 40 MG capsule Take 1 capsule (40 mg total) by mouth daily. before a meal 06/18/18  Yes Shawnee Knapp, MD  estradiol (ESTRACE) 0.1 MG/GM vaginal cream INSERT 0 5 GM VAGINALLY TWICE A WEEK AS DIRECTED 11/16/17  Yes  Haygood, Seymour Bars, MD  fluticasone (FLONASE) 50 MCG/ACT nasal spray Place 2 sprays into both nostrils as needed for allergies. 03/11/16  Yes Shawnee Knapp, MD  propranolol (INDERAL) 40 MG tablet Take 1 tablet (40 mg total) by mouth 4 (four) times daily. 03/18/18  Yes Shawnee Knapp, MD  valACYclovir (VALTREX) 1000 MG tablet Take 1 tablet (1,000 mg total) by mouth daily. For 5 days at first sign of outbreak 03/11/16  Yes Shawnee Knapp, MD  VENTOLIN HFA 108 [phone] Base) MCG/ACT  inhaler INHALE 2 PUFFS EVERY 4-6 HRS AS NEEDED. 12/16/18  Yes Kennyth Arnold, FNP  ALPRAZolam Duanne Moron) 0.25 MG tablet Take 1 tablet (0.25 mg total) by mouth at bedtime as needed for anxiety or sleep. Patient not taking: Reported on 12/22/2018 03/18/18   Shawnee Knapp, MD   Social History   Socioeconomic History  . Marital status: Married    Spouse name: Not on file  . Number of children: 2  . Years of education: Not on file  . Highest education level: Not on file  Occupational History  . Not on file  Social Needs  . Financial resource strain: Not on file  . Food insecurity:    Worry: Not on file    Inability: Not on file  . Transportation needs:    Medical: Not on file    Non-medical: Not on file  Tobacco Use  . Smoking status: Never Smoker  . Smokeless tobacco: Never Used  Substance and Sexual Activity  . Alcohol use: Yes    Comment: social/occasional  . Drug use: No  . Sexual activity: Yes    Partners: Male    Birth control/protection: Surgical, Post-menopausal  Lifestyle  . Physical activity:    Days per week: Not on file    Minutes per session: Not on file  . Stress: Not on file  Relationships  . Social connections:    Talks on phone: Not on file    Gets together: Not on file    Attends religious service: Not on file    Active member of club or organization: Not on file    Attends meetings of clubs or organizations: Not on file    Relationship status: Not on file  . Intimate partner violence:    Fear of current or ex partner: Not on file    Emotionally abused: Not on file    Physically abused: Not on file    Forced sexual activity: Not on file  Other Topics Concern  . Not on file  Social History Narrative  . Not on file    Observations/Objective: Speaking in full sentences, does not appear to be in respiratory distress on video.  Few coughs during video.  Interacting normally.  Appropriate answers.  Assessment and Plan: Mild intermittent asthma with acute  exacerbation - Plan: predniSONE (DELTASONE) 20 MG tablet  -Based on acute onset after fume or chemical at hotel room, I do suspect asthma flare with persistent asthma symptoms.  Denies fever, less likely infectious/less likely COVID-19.  Either way she is not having significant respiratory distress to indicate emergency room or hospital eval at this time.  I suspect higher dose of prednisone with taper will help symptoms.  Start prednisone 60 mg daily for next few days then taper down.  Continue albuterol inhaler, Qvar, and allergy treatment with Flonase and Zyrtec.  Follow-up in 48 hours to check status, ER precautions given  Anxiety - Plan: ALPRAZolam (XANAX) 0.25 MG tablet Intermittent  use, refilled for as needed use, especially during current time.  Follow Up Instructions:    I discussed the assessment and treatment plan with the patient. The patient was provided an opportunity to ask questions and all were answered. The patient agreed with the plan and demonstrated an understanding of the instructions.   The patient was advised to call back or seek an in-person evaluation if the symptoms worsen or if the condition fails to improve as anticipated.  I provided 16 minutes of non-face-to-face time during this encounter.   [name], MD

## 2018-12-22 NOTE — Patient Instructions (Signed)
° ° ° °  If you have lab work done today you will be contacted with your lab results within the next 2 weeks.  If you have not heard from us then please contact us. The fastest way to get your results is to register for My Chart. ° ° °IF you received an x-ray today, you will receive an invoice from [facility]. Please contact [facility] at [phone] with questions or concerns regarding your invoice.  ° °IF you received labwork today, you will receive an invoice from [facility]. Please contact [facility] at [phone] with questions or concerns regarding your invoice.  ° °Our billing staff will not be able to assist you with questions regarding bills from these companies. ° °You will be contacted with the lab results as soon as they are available. The fastest way to get your results is to activate your My Chart account. Instructions are located on the last page of this paperwork. If you have not heard from us regarding the results in 2 weeks, please contact this office. °  ° ° ° °

## 2018-12-29 ENCOUNTER — Telehealth (INDEPENDENT_AMBULATORY_CARE_PROVIDER_SITE_OTHER): Payer: 59 | Admitting: Family Medicine

## 2018-12-29 ENCOUNTER — Other Ambulatory Visit: Payer: Self-pay

## 2018-12-29 DIAGNOSIS — J45901 Unspecified asthma with (acute) exacerbation: Secondary | ICD-10-CM

## 2018-12-29 MED ORDER — PREDNISONE 20 MG PO TABS: ORAL_TABLET | ORAL | 0 refills | Status: DC

## 2018-12-29 NOTE — Patient Instructions (Addendum)
If you are getting better, can finish current prednisone taper. If not continuing to improve, can try one more round of prednisone - this was sent to your pharmacy.  If not improving with that taper or at any point you have any worsening symptoms, be seen right away including ER if needed.   Return to the clinic or go to the nearest emergency room if any of your symptoms worsen or new symptoms occur.  Due to continued symptoms, I would recommend 1 week out of work at this time due to asthma at this time.    Asthma, Adult  Asthma is a long-term (chronic) condition that causes recurrent episodes in which the airways become tight and narrow. The airways are the passages that lead from the nose and mouth down into the lungs. Asthma episodes, also called asthma attacks, can cause coughing, wheezing, shortness of breath, and chest pain. The airways can also fill with mucus. During an attack, it can be difficult to breathe. Asthma attacks can range from minor to life threatening. Asthma cannot be cured, but medicines and lifestyle changes can help control it and treat acute attacks. What are the causes? This condition is believed to be caused by inherited (genetic) and environmental factors, but its exact cause is not known. There are many things that can bring on an asthma attack or make asthma symptoms worse (triggers). Asthma triggers are different for each person. Common triggers include:  Mold.  Dust.  Cigarette smoke.  Cockroaches.  Things that can cause allergy symptoms (allergens), such as animal dander or pollen from trees or grass.  Air pollutants such as household cleaners, wood smoke, smog, or [employer] account planner.  Cold air, weather changes, and winds (which increase molds and pollen in the air).  Strong emotional expressions such as crying or laughing hard.  Stress.  Certain medicines (such as aspirin) or types of medicines (such as beta-blockers).  Sulfites in foods and drinks.  Foods and drinks that may contain sulfites include dried fruit, potato chips, and sparkling grape juice.  Infections or inflammatory conditions such as the flu, a cold, or inflammation of the nasal membranes (rhinitis).  Gastroesophageal reflux disease (GERD).  Exercise or strenuous activity. What are the signs or symptoms? Symptoms of this condition may occur right after asthma is triggered or many hours later. Symptoms include:  Wheezing. This can sound like whistling when you breathe.  Excessive nighttime or early morning coughing.  Frequent or severe coughing with a common cold.  Chest tightness.  Shortness of breath.  Tiredness (fatigue) with minimal activity. How is this diagnosed? This condition is diagnosed based on:  Your medical history.  A physical exam.  Tests, which may include: ? Lung function studies and pulmonary studies (spirometry). These tests can evaluate the flow of air in your lungs. ? Allergy tests. ? Imaging tests, such as X-rays. How is this treated? There is no cure for this condition, but treatment can help control your symptoms. Treatment for asthma usually involves:  Identifying and avoiding your asthma triggers.  Using medicines to control your symptoms. Generally, two types of medicines are used to treat asthma: ? Controller medicines. These help prevent asthma symptoms from occurring. They are usually taken every day. ? Fast-acting reliever or rescue medicines. These quickly relieve asthma symptoms by widening the narrow and tight airways. They are used as needed and provide short-term relief.  Using supplemental oxygen. This may be needed during a severe episode.  Using other medicines, such as: ?  Allergy medicines, such as antihistamines, if your asthma attacks are triggered by allergens. ? Immune medicines (immunomodulators). These are medicines that help control the immune system.  Creating an asthma action plan. An asthma action plan  is a written plan for managing and treating your asthma attacks. This plan includes: ? A list of your asthma triggers and how to avoid them. ? Information about when medicines should be taken and when their dosage should be changed. ? Instructions about using a device called a peak flow meter. A peak flow meter measures how well the lungs are working and the severity of your asthma. It helps you monitor your condition. Follow these instructions at home: Controlling your home environment Control your home environment in the following ways to help avoid triggers and prevent asthma attacks:  Change your heating and air conditioning filter regularly.  Limit your use of fireplaces and wood stoves.  Get rid of pests (such as roaches and mice) and their droppings.  Throw away plants if you see mold on them.  Clean floors and dust surfaces regularly. Use unscented cleaning products.  Try to have someone else vacuum for you regularly. Stay out of rooms while they are being vacuumed and for a short while afterward. If you vacuum, use a dust mask from a hardware store, a double-layered or microfilter vacuum cleaner bag, or a vacuum cleaner with a HEPA filter.  Replace carpet with wood, tile, or vinyl flooring. Carpet can trap dander and dust.  Use allergy-proof pillows, mattress covers, and box spring covers.  Keep your bedroom a trigger-free room.  Avoid pets and keep windows closed when allergens are in the air.  Wash beddings every week in hot water and dry them in a dryer.  Use blankets that are made of polyester or cotton.  Clean bathrooms and kitchens with bleach. If possible, have someone repaint the walls in these rooms with mold-resistant paint. Stay out of the rooms that are being cleaned and painted.  Wash your hands often with soap and water. If soap and water are not available, use hand sanitizer.  Do not allow anyone to smoke in your home. General instructions  Take  over-the-counter and prescription medicines only as told by your health care provider. ? Speak with your health care provider if you have questions about how or when to take the medicines. ? Make note if you are requiring more frequent dosages.  Do not use any products that contain nicotine or tobacco, such as cigarettes and e-cigarettes. If you need help quitting, ask your health care provider. Also, avoid being exposed to secondhand smoke.  Use a peak flow meter as told by your health care provider. Record and keep track of the readings.  Understand and use the asthma action plan to help minimize, or stop an asthma attack, without needing to seek medical care.  Make sure you stay up to date on your yearly vaccinations as told by your health care provider. This may include vaccines for the flu and pneumonia.  Avoid outdoor activities when allergen counts are high and when air quality is low.  Wear a ski mask that covers your nose and mouth during outdoor winter activities. Exercise indoors on cold days if you can.  Warm up before exercising, and take time for a cool-down period after exercise.  Keep all follow-up visits as told by your health care provider. This is important. Where to find more information  For information about asthma, turn to the Centers for  Disease Control and Prevention at http://www.clark.net/.htm  For air quality information, turn to [employer]Now at WeightRating.nl Contact a health care provider if:  You have wheezing, shortness of breath, or a cough even while you are taking medicine to prevent attacks.  The mucus you cough up (sputum) is thicker than usual.  Your sputum changes from clear or white to yellow, green, gray, or bloody.  Your medicines are causing side effects, such as a rash, itching, swelling, or trouble breathing.  You need to use a reliever medicine more than 2-3 times a week.  Your peak flow reading is still at 50-79% of your personal  best after following your action plan for 1 hour.  You have a fever. Get help right away if:  You are getting worse and do not respond to treatment during an asthma attack.  You are short of breath when at rest or when doing very little physical activity.  You have difficulty eating, drinking, or talking.  You have chest pain or tightness.  You develop a fast heartbeat or palpitations.  You have a bluish color to your lips or fingernails.  You are light-headed or dizzy, or you faint.  Your peak flow reading is less than 50% of your personal best.  You feel too tired to breathe normally. Summary  Asthma is a long-term (chronic) condition that causes recurrent episodes in which the airways become tight and narrow. These episodes can cause coughing, wheezing, shortness of breath, and chest pain.  Asthma cannot be cured, but medicines and lifestyle changes can help control it and treat acute attacks.  Make sure you understand how to avoid triggers and how and when to use your medicines.  Asthma attacks can range from minor to life threatening. Get help right away if you have an asthma attack and do not respond to treatment with your usual rescue medicines. This information is not intended to replace advice given to you by your health care provider. Make sure you discuss any questions you have with your health care provider. Document Released: 09/08/2005 Document Revised: 10/13/2016 Document Reviewed: 10/13/2016 Elsevier Interactive Patient Education  2019 [employer].

## 2018-12-29 NOTE — Progress Notes (Signed)
Pt states she need to follow-up about her Asthma concerns. She states she is still currently having shob and cough. She said she was feeling a little better after her last OV but still needs to follow-up.

## 2018-12-29 NOTE — Progress Notes (Signed)
Virtual Visit via Video Note  I connected with Amy Phillips on 12/29/18 at 10:54 AM by a video enabled telemedicine application and verified that I am speaking with the correct person using two identifiers.   I discussed the limitations, risks, security and privacy concerns of performing an evaluation and management service by telephone and the availability of in person appointments. I also discussed with the patient that there may be a patient responsible charge related to this service. The patient expressed understanding and agreed to proceed, consent obtained  Chief complaint: asthma follow up.   History of Present Illness: Amy Phillips is a 63 y.o. female  Asthma flare: Telemedicine visit 1 week ago.  See details of that visit.  In summary and had a ED visit March 26 with prednisone 40 mg daily x5 days.  Suspected flare of asthma from a chemical irritant in [place].  She had been using her Qvar Zyrtec and Flonase.  Prednisone had stabilized her symptoms but not resolving.  Had required higher doses and prolonged tapers of prednisone in the past when followed by allergist.  Decided on higher dose of prednisone with more prolonged taper, started prednisone 60 mg daily for 3 days, followed by 40 mg for 2 days, 20 mg for 2 days, 10 mg for 2 days.   Still has 3 doses of prednisone left. On 20mg  today. Still some cough. Still short of breath - using albuterol every 4-6 hours, but closer to every 6 hours, and not waking up at night. No fever, normal respiratory rate, no body aches.  Still winded after long conversations. Feels better than 1 week ago- 30-40% better. Overall getting better.   Feels like chest is tighter, then improves after albuterol.  No pain down arm or back.  Feel similar to prior asthma flare - can take weeks for asthma flare to improve in past.  No heartburn.  No hx. of heart disease or CHF.  Last used albuterol about 1-2 hours ago.  Still on Qvar. Flonase, zyrtec.   No nasal drainage, min eye itching.  Does not feel stressed. Not taking xanax.   Has been out of work until asx for 3 days and d/t concern with immune suppression on prednisone. Was advised to return if can use N95.    Past Medical History:  Diagnosis Date  . Allergy   . Anxiety   . Arrhythmia    PVCs- no meds  . Asthma   . Basal cell carcinoma of nose 08/19/2017  . Diverticulitis 08/05/2012   Dr Collene Mares  . GERD (gastroesophageal reflux disease)   . Ovarian cyst    recurrent  . PVC's (premature ventricular contractions)   . Uterine fibroid 2006   16 week size   Past Surgical History:  Procedure Laterality Date  . ABDOMINAL HYSTERECTOMY N/A 06/12/2015   Procedure: HYSTERECTOMY ABDOMINAL With Vertical Incision;  Surgeon: Eldred Manges, MD;  . BILATERAL SALPINGECTOMY  06/12/2015   Procedure: BILATERAL SALPINGECTOMY;  Surgeon: Eldred Manges, MD;  Location: [facility];  Service: Gynecology;;  . DILATATION & CURETTAGE/HYSTEROSCOPY WITH TRUECLEAR N/A 09/08/2013   Procedure: DILATATION & CURETTAGE/HYSTEROSCOPY WITH TRUCLEAR;  Surgeon: Azalia Bilis, MD;  Location: [facility];  Service: Gynecology;  Laterality: N/A;  . DILATION AND CURETTAGE OF UTERUS     D&E x2 missed ab  . UPPER GI ENDOSCOPY  07/23/12   grade 1 esophagitis noted:  otherwise normal esophagogastrduodenoscopy   Allergies  Allergen Reactions  . Peanuts [Peanut  Oil] Other (See Comments)    Mood changes per pt and husband   . Shellfish Allergy Other (See Comments)    Throat irritation  . Strawberry (Diagnostic)    Prior to Admission medications   Medication Sig Start Date End Date Taking? Authorizing Provider  ALPRAZolam (XANAX) 0.25 MG tablet Take 1 tablet (0.25 mg total) by mouth at bedtime as needed for anxiety or sleep. 12/22/18  Yes [name], MD  beclomethasone (QVAR REDIHALER) 80 MCG/ACT inhaler Inhale 2 puffs into the lungs 2 (two) times daily. 03/18/18  Yes Shawnee Knapp, MD  cetirizine (ZYRTEC) 10 MG  tablet Take 10 mg by mouth at bedtime.    Yes [provider]  esomeprazole (NEXIUM) 40 MG capsule Take 1 capsule (40 mg total) by mouth daily. before a meal 06/18/18  Yes Shawnee Knapp, MD  estradiol (ESTRACE) 0.1 MG/GM vaginal cream INSERT 0 5 GM VAGINALLY TWICE A WEEK AS DIRECTED 11/16/17  Yes Haygood, Seymour Bars, MD  fluticasone (FLONASE) 50 MCG/ACT nasal spray Place 2 sprays into both nostrils as needed for allergies. 03/11/16  Yes Shawnee Knapp, MD  predniSONE (DELTASONE) 20 MG tablet 3 by mouth for 3 days, then 2 by mouth for 2 days, then 1 by mouth for 2 days, then 1/2 by mouth for 2 days. 12/22/18  Yes [name], MD  propranolol (INDERAL) 40 MG tablet Take 1 tablet (40 mg total) by mouth 4 (four) times daily. 03/18/18  Yes Shawnee Knapp, MD  valACYclovir (VALTREX) 1000 MG tablet Take 1 tablet (1,000 mg total) by mouth daily. For 5 days at first sign of outbreak 03/11/16  Yes Shawnee Knapp, MD  VENTOLIN HFA 108 [phone] Base) MCG/ACT inhaler INHALE 2 PUFFS EVERY 4-6 HRS AS NEEDED. 12/16/18  Yes Dutch Quint B, FNP  doxycycline (VIBRAMYCIN) 100 MG capsule  08/25/18   [provider]   Social History   Socioeconomic History  . Marital status: Married    Spouse name: Not on file  . Number of children: 2  . Years of education: Not on file  . Highest education level: Not on file  Occupational History  . Not on file  Social Needs  . Financial resource strain: Not on file  . Food insecurity:    Worry: Not on file    Inability: Not on file  . Transportation needs:    Medical: Not on file    Non-medical: Not on file  Tobacco Use  . Smoking status: Never Smoker  . Smokeless tobacco: Never Used  Substance and Sexual Activity  . Alcohol use: Yes    Comment: social/occasional  . Drug use: No  . Sexual activity: Yes    Partners: Male    Birth control/protection: Surgical, Post-menopausal  Lifestyle  . Physical activity:    Days per week: Not on file    Minutes per session: Not  on file  . Stress: Not on file  Relationships  . Social connections:    Talks on phone: Not on file    Gets together: Not on file    Attends religious service: Not on file    Active member of club or organization: Not on file    Attends meetings of clubs or organizations: Not on file    Relationship status: Not on file  . Intimate partner violence:    Fear of current or ex partner: Not on file    Emotionally abused: Not on file    Physically  abused: Not on file    Forced sexual activity: Not on file  Other Topics Concern  . Not on file  Social History Narrative  . Not on file    Observations/Objective: Few coughs during video visit, but is speaking in full sentences without respiratory distress, no confusion, no visible distress.  Assessment and Plan: Asthma with acute exacerbation, unspecified asthma severity, unspecified whether persistent - Plan: predniSONE (DELTASONE) 20 MG tablet Prolonged asthma flare but still reports similar symptoms previous asthma flares and prior history of prolonged flares requiring prednisone for more than initial burst.  As some improvement, did agree to 1 more taper of prednisone, continue albuterol as needed, but if that is not helping her symptoms or any point she has worsening symptoms needs evaluation in office or through emergency room.  Understanding expressed  Follow Up Instructions: 1 week.    Patient Instructions  If you are getting better, can finish current prednisone taper. If not continuing to improve, can try one more round of prednisone - this was sent to your pharmacy.  If not improving with that taper or at any point you have any worsening symptoms, be seen right away including ER if needed.   Return to the clinic or go to the nearest emergency room if any of your symptoms worsen or new symptoms occur.  Due to continued symptoms, I would recommend 1 week out of work at this time due to asthma at this time.    Asthma, Adult   Asthma is a long-term (chronic) condition that causes recurrent episodes in which the airways become tight and narrow. The airways are the passages that lead from the nose and mouth down into the lungs. Asthma episodes, also called asthma attacks, can cause coughing, wheezing, shortness of breath, and chest pain. The airways can also fill with mucus. During an attack, it can be difficult to breathe. Asthma attacks can range from minor to life threatening. Asthma cannot be cured, but medicines and lifestyle changes can help control it and treat acute attacks. What are the causes? This condition is believed to be caused by inherited (genetic) and environmental factors, but its exact cause is not known. There are many things that can bring on an asthma attack or make asthma symptoms worse (triggers). Asthma triggers are different for each person. Common triggers include:  Mold.  Dust.  Cigarette smoke.  Cockroaches.  Things that can cause allergy symptoms (allergens), such as animal dander or pollen from trees or grass.  Air pollutants such as household cleaners, wood smoke, smog, or Advertising account planner.  Cold air, weather changes, and winds (which increase molds and pollen in the air).  Strong emotional expressions such as crying or laughing hard.  Stress.  Certain medicines (such as aspirin) or types of medicines (such as beta-blockers).  Sulfites in foods and drinks. Foods and drinks that may contain sulfites include dried fruit, potato chips, and sparkling grape juice.  Infections or inflammatory conditions such as the flu, a cold, or inflammation of the nasal membranes (rhinitis).  Gastroesophageal reflux disease (GERD).  Exercise or strenuous activity. What are the signs or symptoms? Symptoms of this condition may occur right after asthma is triggered or many hours later. Symptoms include:  Wheezing. This can sound like whistling when you breathe.  Excessive nighttime or early  morning coughing.  Frequent or severe coughing with a common cold.  Chest tightness.  Shortness of breath.  Tiredness (fatigue) with minimal activity. How is this diagnosed? This condition  is diagnosed based on:  Your medical history.  A physical exam.  Tests, which may include: ? Lung function studies and pulmonary studies (spirometry). These tests can evaluate the flow of air in your lungs. ? Allergy tests. ? Imaging tests, such as X-rays. How is this treated? There is no cure for this condition, but treatment can help control your symptoms. Treatment for asthma usually involves:  Identifying and avoiding your asthma triggers.  Using medicines to control your symptoms. Generally, two types of medicines are used to treat asthma: ? Controller medicines. These help prevent asthma symptoms from occurring. They are usually taken every day. ? Fast-acting reliever or rescue medicines. These quickly relieve asthma symptoms by widening the narrow and tight airways. They are used as needed and provide short-term relief.  Using supplemental oxygen. This may be needed during a severe episode.  Using other medicines, such as: ? Allergy medicines, such as antihistamines, if your asthma attacks are triggered by allergens. ? Immune medicines (immunomodulators). These are medicines that help control the immune system.  Creating an asthma action plan. An asthma action plan is a written plan for managing and treating your asthma attacks. This plan includes: ? A list of your asthma triggers and how to avoid them. ? Information about when medicines should be taken and when their dosage should be changed. ? Instructions about using a device called a peak flow meter. A peak flow meter measures how well the lungs are working and the severity of your asthma. It helps you monitor your condition. Follow these instructions at home: Controlling your home environment Control your home environment in the  following ways to help avoid triggers and prevent asthma attacks:  Change your heating and air conditioning filter regularly.  Limit your use of fireplaces and wood stoves.  Get rid of pests (such as roaches and mice) and their droppings.  Throw away plants if you see mold on them.  Clean floors and dust surfaces regularly. Use unscented cleaning products.  Try to have someone else vacuum for you regularly. Stay out of rooms while they are being vacuumed and for a short while afterward. If you vacuum, use a dust mask from a hardware store, a double-layered or microfilter vacuum cleaner bag, or a vacuum cleaner with a HEPA filter.  Replace carpet with wood, tile, or vinyl flooring. Carpet can trap dander and dust.  Use allergy-proof pillows, mattress covers, and box spring covers.  Keep your bedroom a trigger-free room.  Avoid pets and keep windows closed when allergens are in the air.  Wash beddings every week in hot water and dry them in a dryer.  Use blankets that are made of polyester or cotton.  Clean bathrooms and kitchens with bleach. If possible, have someone repaint the walls in these rooms with mold-resistant paint. Stay out of the rooms that are being cleaned and painted.  Wash your hands often with soap and water. If soap and water are not available, use hand sanitizer.  Do not allow anyone to smoke in your home. General instructions  Take over-the-counter and prescription medicines only as told by your health care provider. ? Speak with your health care provider if you have questions about how or when to take the medicines. ? Make note if you are requiring more frequent dosages.  Do not use any products that contain nicotine or tobacco, such as cigarettes and e-cigarettes. If you need help quitting, ask your health care provider. Also, avoid being exposed to secondhand  smoke.  Use a peak flow meter as told by your health care provider. Record and keep track of the  readings.  Understand and use the asthma action plan to help minimize, or stop an asthma attack, without needing to seek medical care.  Make sure you stay up to date on your yearly vaccinations as told by your health care provider. This may include vaccines for the flu and pneumonia.  Avoid outdoor activities when allergen counts are high and when air quality is low.  Wear a ski mask that covers your nose and mouth during outdoor winter activities. Exercise indoors on cold days if you can.  Warm up before exercising, and take time for a cool-down period after exercise.  Keep all follow-up visits as told by your health care provider. This is important. Where to find more information  For information about asthma, turn to the Centers for Disease Control and Prevention at http://www.clark.net/.htm  For air quality information, turn to [employer]Now at WeightRating.nl Contact a health care provider if:  You have wheezing, shortness of breath, or a cough even while you are taking medicine to prevent attacks.  The mucus you cough up (sputum) is thicker than usual.  Your sputum changes from clear or white to yellow, green, gray, or bloody.  Your medicines are causing side effects, such as a rash, itching, swelling, or trouble breathing.  You need to use a reliever medicine more than 2-3 times a week.  Your peak flow reading is still at 50-79% of your personal best after following your action plan for 1 hour.  You have a fever. Get help right away if:  You are getting worse and do not respond to treatment during an asthma attack.  You are short of breath when at rest or when doing very little physical activity.  You have difficulty eating, drinking, or talking.  You have chest pain or tightness.  You develop a fast heartbeat or palpitations.  You have a bluish color to your lips or fingernails.  You are light-headed or dizzy, or you faint.  Your peak flow reading is less than  50% of your personal best.  You feel too tired to breathe normally. Summary  Asthma is a long-term (chronic) condition that causes recurrent episodes in which the airways become tight and narrow. These episodes can cause coughing, wheezing, shortness of breath, and chest pain.  Asthma cannot be cured, but medicines and lifestyle changes can help control it and treat acute attacks.  Make sure you understand how to avoid triggers and how and when to use your medicines.  Asthma attacks can range from minor to life threatening. Get help right away if you have an asthma attack and do not respond to treatment with your usual rescue medicines. This information is not intended to replace advice given to you by your health care provider. Make sure you discuss any questions you have with your health care provider. Document Released: 09/08/2005 Document Revised: 10/13/2016 Document Reviewed: 10/13/2016 Elsevier Interactive Patient Education  2019 [employer].       I discussed the assessment and treatment plan with the patient. The patient was provided an opportunity to ask questions and all were answered. The patient agreed with the plan and demonstrated an understanding of the instructions.   The patient was advised to call back or seek an in-person evaluation if the symptoms worsen or if the condition fails to improve as anticipated.  I provided 18 minutes of non-face-to-face time during this  encounter.   [name], MD

## 2019-01-05 ENCOUNTER — Telehealth (INDEPENDENT_AMBULATORY_CARE_PROVIDER_SITE_OTHER): Payer: 59 | Admitting: Family Medicine

## 2019-01-05 ENCOUNTER — Encounter: Payer: Self-pay | Admitting: Family Medicine

## 2019-01-05 ENCOUNTER — Other Ambulatory Visit: Payer: Self-pay

## 2019-01-05 DIAGNOSIS — J45901 Unspecified asthma with (acute) exacerbation: Secondary | ICD-10-CM | POA: Diagnosis not present

## 2019-01-05 NOTE — Patient Instructions (Addendum)
Continue prednisone taper.  Try increasing the Qvar to 4 puffs twice per day for now. If that is not helping to decrease use of albuterol in next few days, then would consider changing to combo inhaler such as Symbicort or Advair.  If improving and lessening use of inhaler, could potentially return to work next week, but let me look into the concern regarding multiple doses of prednisone and immunosuppression further.  I will be looking for the form from [facility] health, but will also review the paperwork for [employer].  Return to the clinic or go to the nearest emergency room if any of your symptoms worsen or new symptoms occur.

## 2019-01-05 NOTE — Progress Notes (Signed)
Pt c/o follow up on asthma. Has some papers to fill out for work she will fax over.

## 2019-01-05 NOTE — Progress Notes (Signed)
Virtual Visit via Video Note  I connected with Amy Phillips on 01/05/19 at 1125 by a video enabled telemedicine application and verified that I am speaking with the correct person using two identifiers.   I discussed the limitations, risks, security and privacy concerns of performing an evaluation and management service by telephone and the availability of in person appointments. I also discussed with the patient that there may be a patient responsible charge related to this service. The patient expressed understanding and agreed to proceed, consent obtained  Chief complaint: Asthma   History of Present Illness: Amy Phillips is a 63 y.o. female   Asthma: See previous notes.  Telemedicine visit April 1 and April 8.  Initially had visit March 26, prednisone 40 mg daily x5 days.  Suspected flare of asthma from a chemical irritant when she was in [place] March 10.  With continued Qvar, Zyrtec, Flonase.  Prednisone had stabilized symptoms but did not resolve.  Has required higher doses and prolonged tapers of prednisone in the past when followed by allergist. She was treated with prednisone 60 mg daily x3 days followed by 40 mg for 2 days, 20 mg for 2 days, 10 mg for 2 days at initial telemedicine visit on April 1.  On April 8 tele-med visit, was on 20 mg dosing, still cough, some shortness of breath using albuterol every 4-6 hours, but closer to every 6 hours and not waking up at night any longer.  No fevers, no chest pains, no heartburn, still winded after long conversations but was feeling better than 1 week prior, approximately 30 to 40% better.  Symptoms were consistent with her previous asthma flares.  She was out of work due to immune suppression with prednisone.  Advised her if she was getting better she could finish her current prednisone taper at that time but if not continue to improve 1 additional course of prednisone at 60 mg initially was provided at her pharmacy.  Feeling better.  Started repeat prednisone taper last Friday.  Less feeling of limitation in her breathing.  Using albuterol every 8 hours - about 3 times per day.  Still using Qvar - twice per day - feels significant relief with use of this med. No recent use of LABA, but tolerated in combo in past.  Using MDI .  No fever. Checking temp BID. Min mucus - clear, but not much - scant amount.  No edema. Lungs felt odd - tried incentive spirometer and peak flow - helped soreness/lung sensation.  No audible wheeze or crackles.  Currently on 20mg  of prednisone. Still feels like improving with taper.  Dyspnea with activity remains, but better since last week.  Works as Marine scientist - part time. Initially advised needed to be off prednisone, but then advised she could return to work.   Scheduled to work in 5 days (4/20). 12 hour shift.   Quarnatined form 3/14 through 3/28 from trip form [place]. Kept out of work due to continued wheeze since that time.   3/26 and 3/30 e visits, then 1st visit with me on 4/1.    Patient Active Problem List   Diagnosis Date Noted  . Fibroid, uterine 06/12/2015  . Leiomyoma of uterus, unspecified 12/29/2014  . Abnormal uterine bleeding 12/29/2014  . Enlarged uterus 07/28/2014  . Fibroids 05/24/2013  . Post-menopausal bleeding 05/24/2013  . Diverticulosis 07/27/2012  . Colon polyps 07/27/2012  . HYPERLIPIDEMIA-MIXED 10/04/2010  . Palpitations 10/04/2010  . DYSPNEA 10/04/2010  . DYSPNEA ON EXERTION  10/04/2010   Past Medical History:  Diagnosis Date  . Allergy   . Anxiety   . Arrhythmia    PVCs- no meds  . Asthma   . Basal cell carcinoma of nose 08/19/2017  . Diverticulitis 08/05/2012   Dr Collene Mares  . GERD (gastroesophageal reflux disease)   . Ovarian cyst    recurrent  . PVC's (premature ventricular contractions)   . Uterine fibroid 2006   16 week size   Past Surgical History:  Procedure Laterality Date  . ABDOMINAL HYSTERECTOMY N/A 06/12/2015   Procedure:  HYSTERECTOMY ABDOMINAL With Vertical Incision;  Surgeon: Eldred Manges, MD;  . BILATERAL SALPINGECTOMY  06/12/2015   Procedure: BILATERAL SALPINGECTOMY;  Surgeon: Eldred Manges, MD;  Location: [facility];  Service: Gynecology;;  . DILATATION & CURETTAGE/HYSTEROSCOPY WITH TRUECLEAR N/A 09/08/2013   Procedure: DILATATION & CURETTAGE/HYSTEROSCOPY WITH TRUCLEAR;  Surgeon: Azalia Bilis, MD;  Location: [facility];  Service: Gynecology;  Laterality: N/A;  . DILATION AND CURETTAGE OF UTERUS     D&E x2 missed ab  . UPPER GI ENDOSCOPY  07/23/12   grade 1 esophagitis noted:  otherwise normal esophagogastrduodenoscopy   Allergies  Allergen Reactions  . Peanuts [Peanut Oil] Other (See Comments)    Mood changes per pt and husband   . Shellfish Allergy Other (See Comments)    Throat irritation  . Strawberry (Diagnostic)    Prior to Admission medications   Medication Sig Start Date End Date Taking? Authorizing Provider  ALPRAZolam (XANAX) 0.25 MG tablet Take 1 tablet (0.25 mg total) by mouth at bedtime as needed for anxiety or sleep. 12/22/18  Yes [name], MD  beclomethasone (QVAR REDIHALER) 80 MCG/ACT inhaler Inhale 2 puffs into the lungs 2 (two) times daily. 03/18/18  Yes Shawnee Knapp, MD  cetirizine (ZYRTEC) 10 MG tablet Take 10 mg by mouth at bedtime.    Yes [provider]  doxycycline (VIBRAMYCIN) 100 MG capsule  08/25/18  Yes [provider]  esomeprazole (NEXIUM) 40 MG capsule Take 1 capsule (40 mg total) by mouth daily. before a meal 06/18/18  Yes Shawnee Knapp, MD  estradiol (ESTRACE) 0.1 MG/GM vaginal cream INSERT 0 5 GM VAGINALLY TWICE A WEEK AS DIRECTED 11/16/17  Yes Haygood, Seymour Bars, MD  fluticasone (FLONASE) 50 MCG/ACT nasal spray Place 2 sprays into both nostrils as needed for allergies. 03/11/16  Yes Shawnee Knapp, MD  predniSONE (DELTASONE) 20 MG tablet 3 by mouth for 3 days, then 2 by mouth for 2 days, then 1 by mouth for 2 days, then 1/2 by mouth for 2 days. 12/22/18   Yes [name], MD  predniSONE (DELTASONE) 20 MG tablet 3 by mouth for 3 days, then 2 by mouth for 2 days, then 1 by mouth for 2 days, then 1/2 by mouth for 2 days. 12/29/18  Yes [name], MD  propranolol (INDERAL) 40 MG tablet Take 1 tablet (40 mg total) by mouth 4 (four) times daily. 03/18/18  Yes Shawnee Knapp, MD  valACYclovir (VALTREX) 1000 MG tablet Take 1 tablet (1,000 mg total) by mouth daily. For 5 days at first sign of outbreak 03/11/16  Yes Shawnee Knapp, MD  VENTOLIN HFA 108 [phone] Base) MCG/ACT inhaler INHALE 2 PUFFS EVERY 4-6 HRS AS NEEDED. 12/16/18  Yes Kennyth Arnold, FNP   Social History   Socioeconomic History  . Marital status: Married    Spouse name: Not on file  . Number of children: 2  .  Years of education: Not on file  . Highest education level: Not on file  Occupational History  . Not on file  Social Needs  . Financial resource strain: Not on file  . Food insecurity:    Worry: Not on file    Inability: Not on file  . Transportation needs:    Medical: Not on file    Non-medical: Not on file  Tobacco Use  . Smoking status: Never Smoker  . Smokeless tobacco: Never Used  Substance and Sexual Activity  . Alcohol use: Yes    Comment: social/occasional  . Drug use: No  . Sexual activity: Yes    Partners: Male    Birth control/protection: Surgical, Post-menopausal  Lifestyle  . Physical activity:    Days per week: Not on file    Minutes per session: Not on file  . Stress: Not on file  Relationships  . Social connections:    Talks on phone: Not on file    Gets together: Not on file    Attends religious service: Not on file    Active member of club or organization: Not on file    Attends meetings of clubs or organizations: Not on file    Relationship status: Not on file  . Intimate partner violence:    Fear of current or ex partner: Not on file    Emotionally abused: Not on file    Physically abused: Not on file    Forced sexual activity: Not on  file  Other Topics Concern  . Not on file  Social History Narrative  . Not on file    Observations/Objective: Few isolated coughs during video visit, but speaking in full sentences without respiratory distress, appropriate responses.  Assessment and Plan: Asthma with acute exacerbation, unspecified asthma severity, unspecified whether persistent  -Prolonged flare, but is showing some improvement with decreasing need for albuterol.  -Continue current prednisone taper, but did recommend trying to increase Qvar to higher dose of 4 puffs twice per day at this time.  If not improving in the next few days, then may benefit from addition of LABA with Symbicort or Advair in place of Qvar.  She will let me know by MyChart or phone call.  Continue albuterol as needed for now.  -If continues to improve, anticipate she could potentially return to work next week as should be off immune suppressant dosing of prednisone at that time.  -FMLA paperwork completed, may need to also complete form for [place] but if off prednisone may not apply.  Will review form once she has sent more legible copy.  -RTC/ER precautions if worsening.   Follow Up Instructions: Prn.  Patient Instructions  Continue prednisone taper.  Try increasing the Qvar to 4 puffs twice per day for now. If that is not helping to decrease use of albuterol in next few days, then would consider changing to combo inhaler such as Symbicort or Advair.  If improving and lessening use of inhaler, could potentially return to work next week, but let me look into the concern regarding multiple doses of prednisone and immunosuppression further.  I will be looking for the form from [facility] health, but will also review the paperwork for [employer].  Return to the clinic or go to the nearest emergency room if any of your symptoms worsen or new symptoms occur.       I discussed the assessment and treatment plan with the patient. The patient was provided  an opportunity to ask questions  and all were answered. The patient agreed with the plan and demonstrated an understanding of the instructions.   The patient was advised to call back or seek an in-person evaluation if the symptoms worsen or if the condition fails to improve as anticipated.  I provided 15 minutes of non-face-to-face time during this encounter.   [name], MD

## 2019-01-11 ENCOUNTER — Telehealth: Payer: Self-pay | Admitting: Family Medicine

## 2019-01-11 NOTE — Telephone Encounter (Signed)
Copied from [place] [id number]. Topic: General - Other >> Jan 11, 2019 10:05 AM Richardo Priest, NT wrote: Reason for CRM: Patient called in stating mychart is not working. Will not show [place] recipient of a message she is trying to get to [place]. Please advise.

## 2019-01-11 NOTE — Telephone Encounter (Signed)
Called pt re: provider attestation form for [place]. Returned back to work yesterday. Not concerned about form at this time. Does not  Is wearing mask at all time anyway. Breathing is doing better. Only short of breath before Qvar, but is improving. Breathing is much better and feels well.  Finished prednisone 2 days ago, had been on 10mg  2 days in a row.  Doing better, rtc precautions given.  Many years since PFT"s. Could consider at future visit.

## 2019-01-12 NOTE — Telephone Encounter (Signed)
Patient stated he was informed by Dr Carlota Raspberry himself yday

## 2019-01-15 MED FILL — QVAR REDIHALER 80 MCG/ACT A: 80 | 30 days supply | Qty: 11 | Fill #1

## 2019-01-24 ENCOUNTER — Telehealth: Payer: Self-pay | Admitting: Family Medicine

## 2019-01-24 NOTE — Telephone Encounter (Signed)
Copied from [place] [id number]. Topic: Quick Communication - Rx Refill/Question >> Jan 24, 2019 10:49 AM Burchel, Abbi R wrote: Medication: beclomethasone (QVAR REDIHALER) 80 MCG/ACT inhaler  Pt states she has had to increase dosage of this medication to 4 puffs 2 x per day. She states she is running out early due to having to increase dosage.  Pt is requesting that rx be re-written to reflect the way pt is currently taking medication.    Preferred Pharmacy: [place], [place] - [address]  [phone] (Phone) [phone] (Fax)   Agent: Please be advised that RX refills may take up to 3 business days. We ask that you follow-up with your pharmacy.

## 2019-01-25 ENCOUNTER — Other Ambulatory Visit: Payer: Self-pay | Admitting: Emergency Medicine

## 2019-01-25 DIAGNOSIS — J45901 Unspecified asthma with (acute) exacerbation: Secondary | ICD-10-CM

## 2019-01-25 MED ORDER — BECLOMETHASONE DIPROP HFA 80 MCG/ACT IN AERB: 4.0000 | INHALATION_SPRAY | Freq: Two times a day (BID) | RESPIRATORY_TRACT | 5 refills | Status: DC

## 2019-02-18 MED FILL — QVAR REDIHALER 80 MCG/ACT A: 80 | 15 days supply | Qty: 11 | Fill #0

## 2019-03-24 MED FILL — QVAR REDIHALER 80 MCG/ACT A: 80 | 15 days supply | Qty: 11 | Fill #1

## 2019-03-24 MED FILL — ESOMEPRAZOLE MAG DR 40 MG C: 40 | 90 days supply | Qty: 90 | Fill #2

## 2019-03-24 MED FILL — valACYclovir HCL 1 GM TABS: 1 | 8 days supply | Qty: 30 | Fill #0

## 2019-04-10 ENCOUNTER — Telehealth: Payer: 59 | Admitting: Nurse Practitioner

## 2019-04-10 DIAGNOSIS — H9202 Otalgia, left ear: Secondary | ICD-10-CM | POA: Diagnosis not present

## 2019-04-10 NOTE — Progress Notes (Signed)
E Visit for Swimmer's Ear  We are sorry that you are not feeling well. Here is how we plan to help!  Based on what you have shared with me it looks like you have swimmers ear or inner ear infection.. Swimmer's ear is a redness or swelling, irritation, or infection of your outer ear canal.  These symptoms usually occur within a few days of swimming.  Your ear canal is a tube that goes from the opening of the ear to the eardrum.  When water stays in your ear canal, germs can grow.  This is a painful condition that often happens to children and swimmers of all ages.  It is not contagious and oral antibiotics are not required to treat uncomplicated swimmer's ear.  The usual symptoms include: Itching inside the ear, Redness or a sense of swelling in the ear, Pain when the ear is tugged on when pressure is placed on the ear, Pus draining from the infected ear. and I have prescribed: Ciprofloxin 0.2% and hydrocortisone 1% otic suspension 3 drops in affected ears twice daily for 7 days  Augmentin 625mg  one tablet by mouth twice a day for 10 days  In certain cases swimmer's ear may progress to a more serious bacterial infection of the middle or inner ear.  If you have a fever 102 and up and significantly worsening symptoms, this could indicate a more serious infection moving to the middle/inner and needs face to face evaluation in an office by a provider.  Your symptoms should improve over the next 3 days and should resolve in about 7 days.  HOME CARE:   Wash your hands frequently.  Do not place the tip of the bottle on your ear or touch it with your fingers.  You can take Acetominophen 650 mg every 4-6 hours as needed for pain.  If pain is severe or moderate, you can apply a heating pad (set on low) or hot water bottle (wrapped in a towel) to outer ear for 20 minutes.  This will also increase drainage.  Avoid ear plugs  Do not use Q-tips  After showers, help the water run out by tilting your head to  one side.  GET HELP RIGHT AWAY IF:   Fever is over 102.2 degrees.  You develop progressive ear pain or hearing loss.  Ear symptoms persist longer than 3 days after treatment.  MAKE SURE YOU:   Understand these instructions.  Will watch your condition.  Will get help right away if you are not doing well or get worse.  TO PREVENT SWIMMER'S EAR:  Use a bathing cap or custom fitted swim molds to keep your ears dry.  Towel off after swimming to dry your ears.  Tilt your head or pull your earlobes to allow the water to escape your ear canal.  If there is still water in your ears, consider using a hairdryer on the lowest setting.  Thank you for choosing an e-visit. Your e-visit answers were reviewed by a board certified advanced clinical practitioner to complete your personal care plan. Depending upon the condition, your plan could have included both over the counter or prescription medications. Please review your pharmacy choice. Be sure that the pharmacy you have chosen is open so that you can pick up your prescription now.  If there is a problem you may message your provider in [place] to have the prescription routed to another pharmacy. Your safety is important to [place]. If you have drug allergies check your prescription  carefully.  For the next 24 hours, you can use MyChart to ask questions about today's visit, request a non-urgent call back, or ask for a work or school excuse from your e-visit provider. You will get an email in the next two days asking about your experience. I hope that your e-visit has been valuable and will speed your recovery.  5-10 minutes spent reviewing and documenting in chart.

## 2019-04-11 ENCOUNTER — Telehealth: Payer: Self-pay | Admitting: Family Medicine

## 2019-04-11 NOTE — Telephone Encounter (Signed)
Copied from [place] [phone]. Topic: General - Other >> Apr 11, 2019 11:46 AM Jodie Echevaria wrote: Reason for CRM: Patient called to ask Dr Carlota Raspberry can he send Rx to her Pharmacy for Augmentin and Cipro ear drop which was promised to her on a virtual visit on 04/10/2019 by Dr She spoke with virtually. Per patient she need these medications to get over the ear pain and whatever else she is battling. Please call pt at Ph# [phone]

## 2019-04-12 NOTE — Telephone Encounter (Signed)
Pt has a e-visit and was not prescribed any medication that was listed in her visit. Please advise

## 2019-04-13 ENCOUNTER — Other Ambulatory Visit: Payer: Self-pay | Admitting: Family Medicine

## 2019-04-13 ENCOUNTER — Encounter: Payer: Self-pay | Admitting: Family Medicine

## 2019-04-13 DIAGNOSIS — F419 Anxiety disorder, unspecified: Secondary | ICD-10-CM

## 2019-04-13 MED ORDER — CIPRODEX 0.3-0.1 % OT SUSP: 4.0000 [drp] | Freq: Two times a day (BID) | OTIC | 0 refills | Status: DC

## 2019-04-13 MED ORDER — AMOXICILLIN-POT CLAVULANATE 875-125 MG PO TABS: 1.0000 | ORAL_TABLET | Freq: Two times a day (BID) | ORAL | 0 refills | Status: DC

## 2019-04-13 MED FILL — CIPRODEX OTIC SUSPENSION: 0.3-0.1 | 9 days supply | Qty: 8 | Fill #0

## 2019-04-13 MED FILL — ALPRAZolam 0.25 MG TABS: 0.25 | 30 days supply | Qty: 30 | Fill #0

## 2019-04-13 MED FILL — AMOX-CLAV 875-125 MG TABLET: 875-125 | 7 days supply | Qty: 14 | Fill #0

## 2019-04-13 NOTE — Telephone Encounter (Signed)
Medications have been resent. 

## 2019-04-13 NOTE — Telephone Encounter (Signed)
Controlled substance database (PDMP) reviewed. No concerns appreciated.  Last filled Xanax 12/22/18 for # 30 - refill ordered, but should discuss med at visit prior to further refills. Thanks.

## 2019-04-13 NOTE — Telephone Encounter (Signed)
Pt aware.

## 2019-04-13 NOTE — Telephone Encounter (Signed)
Requested Prescriptions   Pending Prescriptions Disp Refills  . ALPRAZolam (XANAX) 0.25 MG tablet [Pharmacy Med Name: ALPRAZolam 0.25 MG TABS 0.25 TAB] 30 tablet 0    Sig: TAKE 1 TABLET (0.25 MG TOTAL) BY MOUTH AT BEDTIME AS NEEDED FOR ANXIETY OR SLEEP.     Last OV 12/22/2018   Last written 12/22/2018

## 2019-04-21 ENCOUNTER — Other Ambulatory Visit: Payer: Self-pay

## 2019-04-21 ENCOUNTER — Emergency Department
Admission: EM | Admit: 2019-04-21 | Discharge: 2019-04-21 | Disposition: A | Discharge status: Home / Self Care | Payer: 59 | Source: Home / Self Care

## 2019-04-21 DIAGNOSIS — H9201 Otalgia, right ear: Secondary | ICD-10-CM

## 2019-04-21 MED ORDER — PREDNISONE 50 MG PO TABS: 50.0000 mg | ORAL_TABLET | Freq: Every day | ORAL | 0 refills | Status: AC

## 2019-04-21 MED FILL — predniSONE 50 MG TABS: 50 | 5 days supply | Qty: 5 | Fill #0

## 2019-04-21 NOTE — ED Provider Notes (Signed)
[facility]    CSN: [id number] Arrival date & time: 04/21/19  1024     History   Chief Complaint Chief Complaint  Patient presents with  . Otalgia    RT    HPI Amy Phillips is a 63 y.o. female.   HPI  Amy Phillips is a 63 y.o. female presenting to UC with c/o persistent Right ear pain since 04/06/2019 despite having 1 day left of antibiotics, Augmentin and Cipro ear drops, which were prescribed after pt completed a TeleMedicine visit on 04/06/2019 for same pain. Pain initially started in her Right eye with some pressure and shooting sharp pain from her eye, down her face, into her ear and Right side of her neck. Pain has since localized to her Right ear and Right side of her neck. Denies cough, congestion, sore throat, fever, n/v/d. She had chills one night when pain first started. She has been taking OTC Sudafed, claritin, flonase and afrin w/o relief.  Hx of similar pain in the past. She has done well with a "Zpak" and prednisone.     Past Medical History:  Diagnosis Date  . Allergy   . Anxiety   . Arrhythmia    PVCs- no meds  . Asthma   . Basal cell carcinoma of nose 08/19/2017  . Diverticulitis 08/05/2012   Dr Collene Mares  . GERD (gastroesophageal reflux disease)   . Ovarian cyst    recurrent  . PVC's (premature ventricular contractions)   . Uterine fibroid 2006   16 week size    Patient Active Problem List   Diagnosis Date Noted  . Fibroid, uterine 06/12/2015  . Leiomyoma of uterus, unspecified 12/29/2014  . Abnormal uterine bleeding 12/29/2014  . Enlarged uterus 07/28/2014  . Fibroids 05/24/2013  . Post-menopausal bleeding 05/24/2013  . Diverticulosis 07/27/2012  . Colon polyps 07/27/2012  . HYPERLIPIDEMIA-MIXED 10/04/2010  . Palpitations 10/04/2010  . DYSPNEA 10/04/2010  . DYSPNEA ON EXERTION 10/04/2010    Past Surgical History:  Procedure Laterality Date  . ABDOMINAL HYSTERECTOMY N/A 06/12/2015   Procedure: HYSTERECTOMY ABDOMINAL With Vertical  Incision;  Surgeon: Eldred Manges, MD;  . BILATERAL SALPINGECTOMY  06/12/2015   Procedure: BILATERAL SALPINGECTOMY;  Surgeon: Eldred Manges, MD;  Location: [facility];  Service: Gynecology;;  . DILATATION & CURETTAGE/HYSTEROSCOPY WITH TRUECLEAR N/A 09/08/2013   Procedure: DILATATION & CURETTAGE/HYSTEROSCOPY WITH TRUCLEAR;  Surgeon: Azalia Bilis, MD;  Location: [facility];  Service: Gynecology;  Laterality: N/A;  . DILATION AND CURETTAGE OF UTERUS     D&E x2 missed ab  . UPPER GI ENDOSCOPY  07/23/12   grade 1 esophagitis noted:  otherwise normal esophagogastrduodenoscopy    OB History    Gravida  4   Para  2   Term  2   Preterm      AB  2   Living  2     SAB  2   TAB      Ectopic      Multiple      Live Births  2            Home Medications    Prior to Admission medications   Medication Sig Start Date End Date Taking? Authorizing Provider  ALPRAZolam (XANAX) 0.25 MG tablet TAKE 1 TABLET (0.25 MG TOTAL) BY MOUTH AT BEDTIME AS NEEDED FOR ANXIETY OR SLEEP. 04/13/19   [name], MD  amoxicillin-clavulanate (AUGMENTIN) 875-125 MG tablet Take 1 tablet by mouth 2 (two) times daily. 04/13/19  Hassell Done, Mary-Margaret, FNP  beclomethasone (QVAR REDIHALER) 80 MCG/ACT inhaler Inhale 4 puffs into the lungs 2 (two) times daily. 01/25/19   [name], MD  cetirizine (ZYRTEC) 10 MG tablet Take 10 mg by mouth at bedtime.     [provider]  ciprofloxacin-dexamethasone (CIPRODEX) OTIC suspension Place 4 drops into both ears 2 (two) times daily. 04/13/19   Chevis Pretty, FNP  doxycycline (VIBRAMYCIN) 100 MG capsule  08/25/18   [provider]  esomeprazole (NEXIUM) 40 MG capsule Take 1 capsule (40 mg total) by mouth daily. before a meal 06/18/18   Shawnee Knapp, MD  estradiol (ESTRACE) 0.1 MG/GM vaginal cream INSERT 0 5 GM VAGINALLY TWICE A WEEK AS DIRECTED 11/16/17   Haygood, Seymour Bars, MD  fluticasone (FLONASE) 50 MCG/ACT nasal spray Place 2 sprays  into both nostrils as needed for allergies. 03/11/16   Shawnee Knapp, MD  predniSONE (DELTASONE) 50 MG tablet Take 1 tablet (50 mg total) by mouth daily with breakfast for 5 days. 04/21/19 04/26/19  Noe Gens, PA-C  propranolol (INDERAL) 40 MG tablet Take 1 tablet (40 mg total) by mouth 4 (four) times daily. 03/18/18   Shawnee Knapp, MD  valACYclovir (VALTREX) 1000 MG tablet Take 1 tablet (1,000 mg total) by mouth daily. For 5 days at first sign of outbreak 03/11/16   Shawnee Knapp, MD  VENTOLIN HFA 108 [phone] Base) MCG/ACT inhaler INHALE 2 PUFFS EVERY 4-6 HRS AS NEEDED. 12/16/18   Kennyth Arnold, FNP    Family History Family History  Problem Relation Age of Onset  . Arthritis Mother   . Osteoporosis Mother   . Breast cancer Mother   . COPD Mother   . Lung cancer Mother   . COPD Father   . Heart disease Father   . Lung cancer Father   . Hypertension Sister   . Heart disease Sister   . Arthritis Sister   . Hypertension Brother     Social History Social History   Tobacco Use  . Smoking status: Never Smoker  . Smokeless tobacco: Never Used  Substance Use Topics  . Alcohol use: Yes    Comment: social/occasional  . Drug use: No     Allergies   Peanuts [peanut oil], Shellfish allergy, and Strawberry (diagnostic)   Review of Systems Review of Systems  Constitutional: Negative for chills and fever.  HENT: Positive for ear pain (Right). Negative for congestion, sinus pressure, sinus pain, sore throat, trouble swallowing and voice change.   Respiratory: Negative for cough and shortness of breath.   Cardiovascular: Negative for chest pain and palpitations.  Gastrointestinal: Negative for abdominal pain, diarrhea, nausea and vomiting.  Musculoskeletal: Negative for arthralgias, back pain and myalgias.  Skin: Negative for rash.  Neurological: Negative for dizziness, light-headedness and headaches.     Physical Exam Triage Vital Signs ED Triage Vitals [04/21/19 1039]  Enc Vitals  Group     BP (!) 144/83     Pulse Rate 86     Resp 18     Temp 98.1 F (36.7 C)     Temp Source Oral     SpO2 99 %     Weight      Height      Head Circumference      Peak Flow      Pain Score 0     Pain Loc      Pain Edu?      Excl. in [place]?  No data found.  Updated Vital Signs BP (!) 144/83 (BP Location: Right Arm)   Pulse 86   Temp 98.1 F (36.7 C) (Oral)   Resp 18   LMP  (LMP Unknown)   SpO2 99%   Visual Acuity Right Eye Distance:   Left Eye Distance:   Bilateral Distance:    Right Eye Near:   Left Eye Near:    Bilateral Near:     Physical Exam Vitals signs and nursing note reviewed.  Constitutional:      Appearance: Normal appearance. She is well-developed.  HENT:     Head: Normocephalic and atraumatic.     Right Ear: Tympanic membrane and ear canal normal. No mastoid tenderness. Tympanic membrane is not erythematous or bulging.     Left Ear: Tympanic membrane and ear canal normal. No mastoid tenderness. Tympanic membrane is not erythematous or bulging.     Nose: Nose normal.     Right Sinus: No maxillary sinus tenderness or frontal sinus tenderness.     Left Sinus: No maxillary sinus tenderness or frontal sinus tenderness.     Mouth/Throat:     Lips: Pink.     Mouth: Mucous membranes are moist.     Pharynx: Oropharynx is clear. Uvula midline.  Neck:     Musculoskeletal: Normal range of motion.  Cardiovascular:     Rate and Rhythm: Normal rate and regular rhythm.  Pulmonary:     Effort: Pulmonary effort is normal. No respiratory distress.     Breath sounds: Normal breath sounds. No stridor. No wheezing, rhonchi or rales.  Musculoskeletal: Normal range of motion.  Skin:    General: Skin is warm and dry.  Neurological:     Mental Status: She is alert and oriented to person, place, and time.  Psychiatric:        Behavior: Behavior normal.      UC Treatments / Results  Labs (all labs ordered are listed, but only abnormal results are displayed)  Labs Reviewed - No data to display  EKG   Radiology No results found.  Procedures Procedures (including critical care time)  Medications Ordered in UC Medications - No data to display  Initial Impression / Assessment and Plan / UC Course  I have reviewed the triage vital signs and the nursing notes.  Pertinent labs & imaging results that were available during my care of the patient were reviewed by me and considered in my medical decision making (see chart for details).     Tympanometry: NORMAL in both ears. Reassured pt, no signs of infection at this time. No indication for antibiotics. Can try trial of prednisone as she has done well with this in the past. Also encouraged f/u with PCP and dentist to discuss trigeminal neuralgia as pain started at Right eye.  Final Clinical Impressions(s) / UC Diagnoses   Final diagnoses:  Acute otalgia, right     Discharge Instructions      You may take 500mg  acetaminophen every 4-6 hours or in combination with ibuprofen 400-600mg  every 6-8 hours as needed for pain, inflammation, and fever.  Please follow up with your family doctor next week, especially if not improving.  You may want to discuss possibility of trigeminal neuralgia with your family doctor and/or dentist to see if they have any recommendations in how to prevent recurrent pain or home care when/if pain returns.      ED Prescriptions    Medication Sig Dispense Auth. Provider   predniSONE (DELTASONE) 50 MG  tablet Take 1 tablet (50 mg total) by mouth daily with breakfast for 5 days. 5 tablet Noe Gens, PA-C     Controlled Substance Prescriptions [place] Controlled Substance Registry consulted? Not Applicable   Tyrell Antonio 04/21/19 1137

## 2019-04-21 NOTE — Discharge Instructions (Signed)
°  You may take 500mg  acetaminophen every 4-6 hours or in combination with ibuprofen 400-600mg  every 6-8 hours as needed for pain, inflammation, and fever.  Please follow up with your family doctor next week, especially if not improving.  You may want to discuss possibility of trigeminal neuralgia with your family doctor and/or dentist to see if they have any recommendations in how to prevent recurrent pain or home care when/if pain returns.

## 2019-04-21 NOTE — ED Triage Notes (Signed)
Pt c/o a dull ear ache in her RT ear since 7/15. Had e visit 7/20. Was given Augmentin and Cipro ear drops (7/25). Not much improved since.

## 2019-04-25 ENCOUNTER — Ambulatory Visit: Payer: 59 | Admitting: Family Medicine

## 2019-04-27 ENCOUNTER — Other Ambulatory Visit: Payer: Self-pay | Admitting: Family Medicine

## 2019-04-27 MED ORDER — PROPRANOLOL HCL 40 MG PO TABS: 40.0000 mg | ORAL_TABLET | Freq: Four times a day (QID) | ORAL | 0 refills | Status: DC

## 2019-04-27 MED FILL — PROPRANOLOL 40 MG TABLET: 40 | 8 days supply | Qty: 30 | Fill #0

## 2019-04-27 MED FILL — QVAR REDIHALER 80 MCG/ACT A: 80 | 15 days supply | Qty: 11 | Fill #2

## 2019-04-27 NOTE — Telephone Encounter (Signed)
Medication Refill - Medication: propranolol (INDERAL) 40 MG tablet  Has the patient contacted their pharmacy? Pharmacy calling (Agent: If no, request that the patient contact the pharmacy for the refill.) (Agent: If yes, when and what did the pharmacy advise?)  Preferred Pharmacy (with phone number or street name):  [place], [place] - [address]. [phone] (Phone) [phone] (Fax)   Agent: Please be advised that RX refills may take up to 3 business days. We ask that you follow-up with your pharmacy.

## 2019-04-28 ENCOUNTER — Other Ambulatory Visit: Payer: Self-pay

## 2019-04-28 ENCOUNTER — Encounter: Payer: Self-pay | Admitting: Family Medicine

## 2019-04-28 ENCOUNTER — Ambulatory Visit: Payer: 59 | Admitting: Family Medicine

## 2019-04-28 VITALS — BP 130/78 | HR 75 | Temp 98.6°F | Resp 14 | Wt 138.4 lb

## 2019-04-28 DIAGNOSIS — J4540 Moderate persistent asthma, uncomplicated: Secondary | ICD-10-CM

## 2019-04-28 DIAGNOSIS — Z9189 Other specified personal risk factors, not elsewhere classified: Secondary | ICD-10-CM | POA: Diagnosis not present

## 2019-04-28 DIAGNOSIS — H9201 Otalgia, right ear: Secondary | ICD-10-CM | POA: Diagnosis not present

## 2019-04-28 DIAGNOSIS — R002 Palpitations: Secondary | ICD-10-CM

## 2019-04-28 DIAGNOSIS — H9191 Unspecified hearing loss, right ear: Secondary | ICD-10-CM | POA: Diagnosis not present

## 2019-04-28 DIAGNOSIS — F418 Other specified anxiety disorders: Secondary | ICD-10-CM

## 2019-04-28 DIAGNOSIS — Z889 Allergy status to unspecified drugs, medicaments and biological substances status: Secondary | ICD-10-CM

## 2019-04-28 MED ORDER — PREDNISONE 20 MG PO TABS: ORAL_TABLET | ORAL | 0 refills | Status: DC

## 2019-04-28 MED ORDER — VENTOLIN HFA 108 (90 BASE) MCG/ACT IN AERS: INHALATION_SPRAY | RESPIRATORY_TRACT | 0 refills | Status: DC

## 2019-04-28 MED FILL — predniSONE 20 MG TABS: 20 | 9 days supply | Qty: 16 | Fill #0

## 2019-04-28 NOTE — Patient Instructions (Addendum)
No change in xanax for now.  See info on anxiety and stress management below, but I would like to discuss anxiety further in next few months.   Restart prednisone for now until ENT eval, although less likely sensorineural hearing loss with reassuring exam today. If symptoms thought to be trigeminal neuralgia, neuro eval would be recommended (also recommend seeing neuro if symptoms return).   If any change in heart palpitations or more frequent dosing of propanolol, would like to look at other causes/treatments.  Follow-up in 3 months to review chronic medication.  I will refer you to allergy to discuss potential allergy testing, including for [place] nuts as well as to discuss further treatment for asthma.  Based on the severity or prolonged nature of your previous asthma flare, I did complete the paperwork for possible advanced lung disease but that can be further determined by discussion with your allergist.  Currently you will be on high-dose steroids which should also change restrictions.  Let me know if there are questions on the form.  Return to the clinic or go to the nearest emergency room if any of your symptoms worsen or new symptoms occur.       Living With Anxiety  After being diagnosed with an anxiety disorder, you may be relieved to know why you have felt or behaved a certain way. It is natural to also feel overwhelmed about the treatment ahead and what it will mean for your life. With care and support, you can manage this condition and recover from it. How to cope with anxiety Dealing with stress Stress is your body's reaction to life changes and events, both good and bad. Stress can last just a few hours or it can be ongoing. Stress can play a major role in anxiety, so it is important to learn both how to cope with stress and how to think about it differently. Talk with your health care provider or a counselor to learn more about stress reduction. He or she may suggest some  stress reduction techniques, such as:  Music therapy. This can include creating or listening to music that you enjoy and that inspires you.  Mindfulness-based meditation. This involves being aware of your normal breaths, rather than trying to control your breathing. It can be done while sitting or walking.  Centering prayer. This is a kind of meditation that involves focusing on a word, phrase, or sacred image that is meaningful to you and that brings you peace.  Deep breathing. To do this, expand your stomach and inhale slowly through your nose. Hold your breath for 3-5 seconds. Then exhale slowly, allowing your stomach muscles to relax.  Self-talk. This is a skill where you identify thought patterns that lead to anxiety reactions and correct those thoughts.  Muscle relaxation. This involves tensing muscles then relaxing them. Choose a stress reduction technique that fits your lifestyle and personality. Stress reduction techniques take time and practice. Set aside 5-15 minutes a day to do them. Therapists can offer training in these techniques. The training may be covered by some insurance plans. Other things you can do to manage stress include:  Keeping a stress diary. This can help you learn what triggers your stress and ways to control your response.  Thinking about how you respond to certain situations. You may not be able to control everything, but you can control your reaction.  Making time for activities that help you relax, and not feeling guilty about spending your time in this way.  Therapy combined with coping and stress-reduction skills provides the best chance for successful treatment. Medicines Medicines can help ease symptoms. Medicines for anxiety include:  Anti-anxiety drugs.  Antidepressants.  Beta-blockers. Medicines may be used as the main treatment for anxiety disorder, along with therapy, or if other treatments are not working. Medicines should be prescribed by a  health care provider. Relationships Relationships can play a big part in helping you recover. Try to spend more time connecting with trusted friends and family members. Consider going to couples counseling, taking family education classes, or going to family therapy. Therapy can help you and others better understand the condition. How to recognize changes in your condition Everyone has a different response to treatment for anxiety. Recovery from anxiety happens when symptoms decrease and stop interfering with your daily activities at home or work. This may mean that you will start to:  Have better concentration and focus.  Sleep better.  Be less irritable.  Have more energy.  Have improved memory. It is important to recognize when your condition is getting worse. Contact your health care provider if your symptoms interfere with home or work and you do not feel like your condition is improving. Where to find help and support: You can get help and support from these sources:  Self-help groups.  Online and [employer].  A trusted spiritual leader.  Couples counseling.  Family education classes.  Family therapy. Follow these instructions at home:  Eat a healthy diet that includes plenty of vegetables, fruits, whole grains, low-fat dairy products, and lean protein. Do not eat a lot of foods that are high in solid fats, added sugars, or salt.  Exercise. Most adults should do the following: ? Exercise for at least 150 minutes each week. The exercise should increase your heart rate and make you sweat (moderate-intensity exercise). ? Strengthening exercises at least twice a week.  Cut down on caffeine, tobacco, alcohol, and other potentially harmful substances.  Get the right amount and quality of sleep. Most adults need 7-9 hours of sleep each night.  Make choices that simplify your life.  Take over-the-counter and prescription medicines only as told by your health  care provider.  Avoid caffeine, alcohol, and certain over-the-counter cold medicines. These may make you feel worse. Ask your pharmacist which medicines to avoid.  Keep all follow-up visits as told by your health care provider. This is important. Questions to ask your health care provider  Would I benefit from therapy?  How often should I follow up with a health care provider?  How long do I need to take medicine?  Are there any long-term side effects of my medicine?  Are there any alternatives to taking medicine? Contact a health care provider if:  You have a hard time staying focused or finishing daily tasks.  You spend many hours a day feeling worried about everyday life.  You become exhausted by worry.  You start to have headaches, feel tense, or have nausea.  You urinate more than normal.  You have diarrhea. Get help right away if:  You have a racing heart and shortness of breath.  You have thoughts of hurting yourself or others. If you ever feel like you may hurt yourself or others, or have thoughts about taking your own life, get help right away. You can go to your nearest emergency department or call:  Your local emergency services (911 in the [place]  A suicide crisis helpline, such as the [facility] at  340-167-8349. This is open 24-hours a day. Summary  Taking steps to deal with stress can help calm you.  Medicines cannot cure anxiety disorders, but they can help ease symptoms.  Family, friends, and partners can play a big part in helping you recover from an anxiety disorder. This information is not intended to replace advice given to you by your health care provider. Make sure you discuss any questions you have with your health care provider. Document Released: 09/02/2016 Document Revised: 08/21/2017 Document Reviewed: 09/02/2016 Elsevier Patient Education  [employer].    If you have lab work done today you will be  contacted with your lab results within the next 2 weeks.  If you have not heard from [place] then please contact us. The fastest way to get your results is to register for My Chart.   IF you received an x-ray today, you will receive an invoice from [facility]. Please contact [facility] at [phone] with questions or concerns regarding your invoice.   IF you received labwork today, you will receive an invoice from [facility]. Please contact [facility] at [phone] with questions or concerns regarding your invoice.   Our billing staff will not be able to assist you with questions regarding bills from these companies.  You will be contacted with the lab results as soon as they are available. The fastest way to get your results is to activate your My Chart account. Instructions are located on the last page of this paperwork. If you have not heard from [place] regarding the results in 2 weeks, please contact this office.

## 2019-04-28 NOTE — Progress Notes (Signed)
Subjective:    Patient ID: Amy Phillips, female    DOB: [birth year], 63 y.o.   MRN: [id number]  HPI Amy Phillips is a 63 y.o. female Presents today for: Chief Complaint  Patient presents with  . Ear Pain    both ear hurt but right ear is worse. Patient states this may be a reaction to the nuts she recent ate  . Follow-up    f/u on chronic asthma and need a form filled out for work. Need a refill on inhaler   Ear pain: Bilateral, worse in the right ear.  Started in R on 7/15, sharp shooting pain with eye movement.. thought was trigeminal nerve? Noticed pain moving from ear down neck.  evisit 5 days later. rx augmentin and Cipro ear drops for possible swimmer's ear, but did not get meds initially. Improved in few days without taking meds, then flared up again  5 days later. Did fill Rx at that time - started augmentin and cipro -  R neck nodes felt swollen.  Has cat at home, but no recent scratches.   Saw [facility] 7/30. Normal tympanometry, no sign of infection, exam appeared normal, started on prednisone 50 mg for 5 days for possible trigeminal neuralgia.  Ears are better, swelling in LN went down. Was feeling back to normal with use of steroid - back to baseline until 4 days ago (last day of prednisone).   Ate some [place] nuts - felt like inside of R ear was swelling, no visible swelling. Felt like trouble breathing - short of breath, no wheezing.  Took 25mg  benadryl, usual dose of zyrtec 10mg , and albuterol and qvar. Felt better that night except still some fullness in R ear.  Similar sx's next day with [place] nuts. Treated again with benadryl, inhaler.  R ear feels swollen still but not painful. Some difficulty hearing out of the ear again this week. Did note that she ate [place] nuts on 7/14 prior to initiial sx's above.  Still feels fullness/inflamed R ear. Tx: ibuprofen 400mg -600mg  - BID. Mod relief.  No fever.    Asthma; Moderate persistent per prior notes.  Evaluated with telemedicine visits for continued asthma flare, most recently discussed April 15.  Thought to initially have a chemical irritant that caused flare back in March.  Did require prolonged prednisone taper/repeat prednisone taper.  Increase her Qvar to 4 puffs twice per day.  Saw allergist about 10 years ago - would like to se allergist again.  Had joint pain and "anaphylactic reactions" to prior allergy shots.  Thinks prolonged need for prednisone was due to mold in the house.  Usually 2 puffs BID, up to 4 puffs every other day. Feels like this is doing well.  Needs form completed for work to avoid floating to Covid care hospital or taking Covid patients. advanced lung disease?   Anxiety: Situational anxiety, has been treated with low-dose Xanax 0.25 mg previously. Controlled substance database (PDMP) reviewed. No concerns appreciated.  Last filled for #30 on 7/22, previously for #30 on April 1. Periods of anxiety that last for few days.- takes one per day during flare then weeks without.  On average takes about once per week.  Exercise has helped treat anxiety in past.  Tried lexapro, paxil in past - felt nausea.  Overall anxiety is better.   Palpitations: Has used propanolol, Rare use. Uses less than monthly. Takes 1 pill only.  Had 28 day event monitor for palpitations in 10-11/19. No  afib, only 2% tachycardia.no critical or serious arrythmia.  Thought to be night shift or anxiety.  Usually at night.   Patient Active Problem List   Diagnosis Date Noted  . Fibroid, uterine 06/12/2015  . Leiomyoma of uterus, unspecified 12/29/2014  . Abnormal uterine bleeding 12/29/2014  . Enlarged uterus 07/28/2014  . Fibroids 05/24/2013  . Post-menopausal bleeding 05/24/2013  . Diverticulosis 07/27/2012  . Colon polyps 07/27/2012  . HYPERLIPIDEMIA-MIXED 10/04/2010  . Palpitations 10/04/2010  . DYSPNEA 10/04/2010  . DYSPNEA ON EXERTION 10/04/2010   Past Medical History:   Diagnosis Date  . Allergy   . Anxiety   . Arrhythmia    PVCs- no meds  . Asthma   . Basal cell carcinoma of nose 08/19/2017  . Diverticulitis 08/05/2012   Dr Amy Phillips  . GERD (gastroesophageal reflux disease)   . Ovarian cyst    recurrent  . PVC's (premature ventricular contractions)   . Uterine fibroid 2006   16 week size   Past Surgical History:  Procedure Laterality Date  . ABDOMINAL HYSTERECTOMY N/A 06/12/2015   Procedure: HYSTERECTOMY ABDOMINAL With Vertical Incision;  Surgeon: Amy Manges, MD;  . BILATERAL SALPINGECTOMY  06/12/2015   Procedure: BILATERAL SALPINGECTOMY;  Surgeon: Amy Manges, MD;  Location: [facility];  Service: Gynecology;;  . DILATATION & CURETTAGE/HYSTEROSCOPY WITH TRUECLEAR N/A 09/08/2013   Procedure: DILATATION & CURETTAGE/HYSTEROSCOPY WITH TRUCLEAR;  Surgeon: Amy Bilis, MD;  Location: [facility];  Service: Gynecology;  Laterality: N/A;  . DILATION AND CURETTAGE OF UTERUS     D&E x2 missed ab  . UPPER GI ENDOSCOPY  07/23/12   grade 1 esophagitis noted:  otherwise normal esophagogastrduodenoscopy   Allergies  Allergen Reactions  . Peanuts [Peanut Oil] Other (See Comments)    Mood changes per pt and husband   . Shellfish Allergy Other (See Comments)    Throat irritation  . Strawberry (Diagnostic)    Prior to Admission medications   Medication Sig Start Date End Date Taking? Authorizing Provider  ALPRAZolam (XANAX) 0.25 MG tablet TAKE 1 TABLET (0.25 MG TOTAL) BY MOUTH AT BEDTIME AS NEEDED FOR ANXIETY OR SLEEP. 04/13/19  Yes Amy Agreste, MD  beclomethasone (QVAR REDIHALER) 80 MCG/ACT inhaler Inhale 4 puffs into the lungs 2 (two) times daily. 01/25/19  Yes Amy Agreste, MD  cetirizine (ZYRTEC) 10 MG tablet Take 10 mg by mouth at bedtime.    Yes [provider]  doxycycline (VIBRAMYCIN) 100 MG capsule  08/25/18  Yes [provider]  esomeprazole (NEXIUM) 40 MG capsule Take 1 capsule (40 mg total) by mouth daily. before a  meal 06/18/18  Yes Amy Knapp, MD  estradiol (ESTRACE) 0.1 MG/GM vaginal cream INSERT 0 5 GM VAGINALLY TWICE A WEEK AS DIRECTED 11/16/17  Yes Haygood, Seymour Bars, MD  fluticasone (FLONASE) 50 MCG/ACT nasal spray Place 2 sprays into both nostrils as needed for allergies. 03/11/16  Yes Amy Knapp, MD  propranolol (INDERAL) 40 MG tablet Take 40 mg by mouth 4 (four) times daily.   Yes [provider]  valACYclovir (VALTREX) 1000 MG tablet Take 1 tablet (1,000 mg total) by mouth daily. For 5 days at first sign of outbreak 03/11/16  Yes Amy Knapp, MD  VENTOLIN HFA 108 [phone] Base) MCG/ACT inhaler INHALE 2 PUFFS EVERY 4-6 HRS AS NEEDED. 12/16/18  Yes Kennyth Arnold, FNP   Social History   Socioeconomic History  . Marital status: Married    Spouse name: Not on file  .  Number of children: 2  . Years of education: Not on file  . Highest education level: Not on file  Occupational History  . Not on file  Social Needs  . Financial resource strain: Not on file  . Food insecurity    Worry: Not on file    Inability: Not on file  . Transportation needs    Medical: Not on file    Non-medical: Not on file  Tobacco Use  . Smoking status: Never Smoker  . Smokeless tobacco: Never Used  Substance and Sexual Activity  . Alcohol use: Yes    Comment: social/occasional  . Drug use: No  . Sexual activity: Yes    Partners: Male    Birth control/protection: Surgical, Post-menopausal  Lifestyle  . Physical activity    Days per week: Not on file    Minutes per session: Not on file  . Stress: Not on file  Relationships  . Social Herbalist on phone: Not on file    Gets together: Not on file    Attends religious service: Not on file    Active member of club or organization: Not on file    Attends meetings of clubs or organizations: Not on file    Relationship status: Not on file  . Intimate partner violence    Fear of current or ex partner: Not on file    Emotionally abused: Not on  file    Physically abused: Not on file    Forced sexual activity: Not on file  Other Topics Concern  . Not on file  Social History Narrative  . Not on file    Review of Systems As above.       Objective:   Physical Exam Vitals signs reviewed.  Constitutional:      Appearance: She is well-developed.  HENT:     Head: Normocephalic and atraumatic.     Right Ear: Tympanic membrane, ear canal and external ear normal. Decreased hearing (finger rub equal bilaterally. ) noted. No swelling. No middle ear effusion. There is no impacted cerumen. No foreign body. No mastoid tenderness. No hemotympanum.     Ears:     Weber exam findings: does not lateralize.    Right Rinne: AC > BC. Eyes:     Conjunctiva/sclera: Conjunctivae normal.     Pupils: Pupils are equal, round, and reactive to light.  Neck:     Vascular: No carotid bruit.  Cardiovascular:     Rate and Rhythm: Normal rate and regular rhythm.     Heart sounds: Normal heart sounds.  Pulmonary:     Effort: Pulmonary effort is normal.     Breath sounds: Normal breath sounds.  Abdominal:     Palpations: Abdomen is soft. There is no pulsatile mass.     Tenderness: There is no abdominal tenderness.  Skin:    General: Skin is warm and dry.  Neurological:     Mental Status: She is alert and oriented to person, place, and time.  Psychiatric:        Behavior: Behavior normal.    Vitals:   04/28/19 1100  BP: 130/78  Pulse: 75  Resp: 14  Temp: 98.6 F (37 C)  TempSrc: Oral  SpO2: 98%  Weight: 138 lb 6.4 oz (62.8 kg)   Over 40 mins of patient care provided- greater than 50% counseling.      Assessment & Plan:    Amy Phillips is a 63 y.o. female Discomfort of  right ear - Plan: Ambulatory referral to ENT, predniSONE (DELTASONE) 20 MG tablet Decreased hearing, right - Plan: Ambulatory referral to ENT, predniSONE (DELTASONE) 20 MG tablet  -Subjective decreased hearing with fullness sensation.  Attributes to possible allergy  as above,, but differential includes sensorineural hearing loss, and improvement temporarily on prednisone, reported worsening when she stopped.  In office testing did not quite fit with sensorineural hearing loss but with some improvement on prednisone, will restart that temporarily until she is seen by ear nose and throat.  Given previous treatment will provide taper. ENT eval ordered.  Moderate persistent asthma without complication - Plan: VENTOLIN HFA 108 (90 Base) MCG/ACT inhaler, Ambulatory referral to Allergy History of multiple allergies - Plan: Ambulatory referral to Allergy  -Significant symptoms few months ago with prolonged prednisone need.  Suspect allergic component.  Refer to asthma/allergist to determine further testing/treatment changes.  Continue Qvar at current dosing for now with albuterol if needed.  RTC/ER precautions  Situational anxiety  -Intermittent use of benzodiazepine.  Handout given on stress and anxiety management.  If persistent or increased need of benzodiazepine, would consider other trial of SSRI, but can discuss further next few months to decide on possible trial of hydroxyzine or other approaches instead of benzodiazepine.  Palpitations  -Rare, has propanolol if needed.  RTC precautions if increasing/worsening symptoms.  Meds ordered this encounter  Medications  . VENTOLIN HFA 108 (90 Base) MCG/ACT inhaler    Sig: INHALE 2 PUFFS EVERY 4-6 HRS AS NEEDED.    Dispense:  18 g    Refill:  0    Pt does not need currently. Please add this on as refills to current rx.  . predniSONE (DELTASONE) 20 MG tablet    Sig: 3 by mouth for 3 days, then 2 by mouth for 2 days, then 1 by mouth for 2 days, then 1/2 by mouth for 2 days.    Dispense:  16 tablet    Refill:  0   Patient Instructions    No change in xanax for now.  See info on anxiety and stress management below, but I would like to discuss anxiety further in next few months.   Restart prednisone for now until  ENT eval, although less likely sensorineural hearing loss with reassuring exam today. If symptoms thought to be trigeminal neuralgia, neuro eval would be recommended (also recommend seeing neuro if symptoms return).   If any change in heart palpitations or more frequent dosing of propanolol, would like to look at other causes/treatments.  Follow-up in 3 months to review chronic medication.  I will refer you to allergy to discuss potential allergy testing, including for [place] nuts as well as to discuss further treatment for asthma.  Based on the severity or prolonged nature of your previous asthma flare, I did complete the paperwork for possible advanced lung disease but that can be further determined by discussion with your allergist.  Currently you will be on high-dose steroids which should also change restrictions.  Let me know if there are questions on the form.  Return to the clinic or go to the nearest emergency room if any of your symptoms worsen or new symptoms occur.       Living With Anxiety  After being diagnosed with an anxiety disorder, you may be relieved to know why you have felt or behaved a certain way. It is natural to also feel overwhelmed about the treatment ahead and what it will mean for your life. With care  and support, you can manage this condition and recover from it. How to cope with anxiety Dealing with stress Stress is your body's reaction to life changes and events, both good and bad. Stress can last just a few hours or it can be ongoing. Stress can play a major role in anxiety, so it is important to learn both how to cope with stress and how to think about it differently. Talk with your health care provider or a counselor to learn more about stress reduction. He or she may suggest some stress reduction techniques, such as:  Music therapy. This can include creating or listening to music that you enjoy and that inspires you.  Mindfulness-based meditation. This  involves being aware of your normal breaths, rather than trying to control your breathing. It can be done while sitting or walking.  Centering prayer. This is a kind of meditation that involves focusing on a word, phrase, or sacred image that is meaningful to you and that brings you peace.  Deep breathing. To do this, expand your stomach and inhale slowly through your nose. Hold your breath for 3-5 seconds. Then exhale slowly, allowing your stomach muscles to relax.  Self-talk. This is a skill where you identify thought patterns that lead to anxiety reactions and correct those thoughts.  Muscle relaxation. This involves tensing muscles then relaxing them. Choose a stress reduction technique that fits your lifestyle and personality. Stress reduction techniques take time and practice. Set aside 5-15 minutes a day to do them. Therapists can offer training in these techniques. The training may be covered by some insurance plans. Other things you can do to manage stress include:  Keeping a stress diary. This can help you learn what triggers your stress and ways to control your response.  Thinking about how you respond to certain situations. You may not be able to control everything, but you can control your reaction.  Making time for activities that help you relax, and not feeling guilty about spending your time in this way. Therapy combined with coping and stress-reduction skills provides the best chance for successful treatment. Medicines Medicines can help ease symptoms. Medicines for anxiety include:  Anti-anxiety drugs.  Antidepressants.  Beta-blockers. Medicines may be used as the main treatment for anxiety disorder, along with therapy, or if other treatments are not working. Medicines should be prescribed by a health care provider. Relationships Relationships can play a big part in helping you recover. Try to spend more time connecting with trusted friends and family members. Consider  going to couples counseling, taking family education classes, or going to family therapy. Therapy can help you and others better understand the condition. How to recognize changes in your condition Everyone has a different response to treatment for anxiety. Recovery from anxiety happens when symptoms decrease and stop interfering with your daily activities at home or work. This may mean that you will start to:  Have better concentration and focus.  Sleep better.  Be less irritable.  Have more energy.  Have improved memory. It is important to recognize when your condition is getting worse. Contact your health care provider if your symptoms interfere with home or work and you do not feel like your condition is improving. Where to find help and support: You can get help and support from these sources:  Self-help groups.  Online and [employer].  A trusted spiritual leader.  Couples counseling.  Family education classes.  Family therapy. Follow these instructions at home:  Eat a healthy  diet that includes plenty of vegetables, fruits, whole grains, low-fat dairy products, and lean protein. Do not eat a lot of foods that are high in solid fats, added sugars, or salt.  Exercise. Most adults should do the following: ? Exercise for at least 150 minutes each week. The exercise should increase your heart rate and make you sweat (moderate-intensity exercise). ? Strengthening exercises at least twice a week.  Cut down on caffeine, tobacco, alcohol, and other potentially harmful substances.  Get the right amount and quality of sleep. Most adults need 7-9 hours of sleep each night.  Make choices that simplify your life.  Take over-the-counter and prescription medicines only as told by your health care provider.  Avoid caffeine, alcohol, and certain over-the-counter cold medicines. These may make you feel worse. Ask your pharmacist which medicines to avoid.  Keep all  follow-up visits as told by your health care provider. This is important. Questions to ask your health care provider  Would I benefit from therapy?  How often should I follow up with a health care provider?  How long do I need to take medicine?  Are there any long-term side effects of my medicine?  Are there any alternatives to taking medicine? Contact a health care provider if:  You have a hard time staying focused or finishing daily tasks.  You spend many hours a day feeling worried about everyday life.  You become exhausted by worry.  You start to have headaches, feel tense, or have nausea.  You urinate more than normal.  You have diarrhea. Get help right away if:  You have a racing heart and shortness of breath.  You have thoughts of hurting yourself or others. If you ever feel like you may hurt yourself or others, or have thoughts about taking your own life, get help right away. You can go to your nearest emergency department or call:  Your local emergency services (911 in the [place]  A suicide crisis helpline, such as the [place] at 916-285-5248. This is open 24-hours a day. Summary  Taking steps to deal with stress can help calm you.  Medicines cannot cure anxiety disorders, but they can help ease symptoms.  Family, friends, and partners can play a big part in helping you recover from an anxiety disorder. This information is not intended to replace advice given to you by your health care provider. Make sure you discuss any questions you have with your health care provider. Document Released: 09/02/2016 Document Revised: 08/21/2017 Document Reviewed: 09/02/2016 Elsevier Patient Education  [employer].    If you have lab work done today you will be contacted with your lab results within the next 2 weeks.  If you have not heard from [place] then please contact us. The fastest way to get your results is to register for My Chart.    IF you received an x-ray today, you will receive an invoice from [facility]. Please contact [facility] at [phone] with questions or concerns regarding your invoice.   IF you received labwork today, you will receive an invoice from [facility]. Please contact [facility] at [phone] with questions or concerns regarding your invoice.   Our billing staff will not be able to assist you with questions regarding bills from these companies.  You will be contacted with the lab results as soon as they are available. The fastest way to get your results is to activate your My Chart account. Instructions are located on the last page  of this paperwork. If you have not heard from [place] regarding the results in 2 weeks, please contact this office.       Signed,   Merri Ray, MD [facility].  04/28/19 9:27 PM

## 2019-05-13 DIAGNOSIS — R05 Cough: Secondary | ICD-10-CM | POA: Diagnosis not present

## 2019-05-13 DIAGNOSIS — T781XXD Other adverse food reactions, not elsewhere classified, subsequent encounter: Secondary | ICD-10-CM | POA: Diagnosis not present

## 2019-05-13 DIAGNOSIS — J301 Allergic rhinitis due to pollen: Secondary | ICD-10-CM | POA: Diagnosis not present

## 2019-05-13 DIAGNOSIS — J3089 Other allergic rhinitis: Secondary | ICD-10-CM | POA: Diagnosis not present

## 2019-05-13 MED FILL — EPINEPHRINE 0.3 MG AUTO-INJ: 0.3 | 30 days supply | Qty: 2 | Fill #0

## 2019-05-13 MED FILL — BREO ELLIPTA 200-25 MCG INH: 200-25 | 30 days supply | Qty: 60 | Fill #0

## 2019-05-13 MED FILL — LEVOCETIRIZINE 5 MG TABLET: 5 | 30 days supply | Qty: 30 | Fill #0

## 2019-05-18 DIAGNOSIS — H9209 Otalgia, unspecified ear: Secondary | ICD-10-CM | POA: Diagnosis not present

## 2019-05-18 DIAGNOSIS — H6981 Other specified disorders of Eustachian tube, right ear: Secondary | ICD-10-CM | POA: Diagnosis not present

## 2019-05-18 DIAGNOSIS — H903 Sensorineural hearing loss, bilateral: Secondary | ICD-10-CM | POA: Diagnosis not present

## 2019-05-18 DIAGNOSIS — T781XXA Other adverse food reactions, not elsewhere classified, initial encounter: Secondary | ICD-10-CM | POA: Diagnosis not present

## 2019-05-20 ENCOUNTER — Telehealth: Payer: 59 | Admitting: Nurse Practitioner

## 2019-05-20 DIAGNOSIS — J301 Allergic rhinitis due to pollen: Secondary | ICD-10-CM | POA: Diagnosis not present

## 2019-05-20 DIAGNOSIS — N3000 Acute cystitis without hematuria: Secondary | ICD-10-CM

## 2019-05-20 MED ORDER — CEPHALEXIN 500 MG PO CAPS: 500.0000 mg | ORAL_CAPSULE | Freq: Two times a day (BID) | ORAL | 0 refills | Status: DC

## 2019-05-20 NOTE — Progress Notes (Signed)

## 2019-05-23 ENCOUNTER — Telehealth: Payer: Self-pay | Admitting: Family Medicine

## 2019-05-23 DIAGNOSIS — J3089 Other allergic rhinitis: Secondary | ICD-10-CM | POA: Diagnosis not present

## 2019-05-23 NOTE — Telephone Encounter (Signed)
Pharmacy called and stated that the Pt didn't need the Rx for VENTOLIN HFA 108 (90 Base) MCG/ACT inhaler  At the time it was sent in but there is now a generic and they want to know if the generic can be okayed or if the Pt has to have the brand name. If the Pt has to have the Brand name then a PA is needed / please advise

## 2019-05-27 ENCOUNTER — Other Ambulatory Visit: Payer: Self-pay | Admitting: Registered Nurse

## 2019-05-27 ENCOUNTER — Encounter: Payer: Self-pay | Admitting: Registered Nurse

## 2019-05-27 MED ORDER — ALBUTEROL SULFATE HFA 108 (90 BASE) MCG/ACT IN AERS: 2.0000 | INHALATION_SPRAY | Freq: Four times a day (QID) | RESPIRATORY_TRACT | 2 refills | Status: AC | PRN

## 2019-05-27 MED FILL — ALBUTEROL SULFATE HFA 108 (: 108 (90 BAS | 25 days supply | Qty: 18 | Fill #0

## 2019-05-31 DIAGNOSIS — J3089 Other allergic rhinitis: Secondary | ICD-10-CM | POA: Diagnosis not present

## 2019-05-31 DIAGNOSIS — J301 Allergic rhinitis due to pollen: Secondary | ICD-10-CM | POA: Diagnosis not present

## 2019-06-06 DIAGNOSIS — J3089 Other allergic rhinitis: Secondary | ICD-10-CM | POA: Diagnosis not present

## 2019-06-06 DIAGNOSIS — J301 Allergic rhinitis due to pollen: Secondary | ICD-10-CM | POA: Diagnosis not present

## 2019-06-09 ENCOUNTER — Telehealth: Payer: 59 | Admitting: Physician Assistant

## 2019-06-09 DIAGNOSIS — N3000 Acute cystitis without hematuria: Secondary | ICD-10-CM

## 2019-06-09 MED ORDER — SULFAMETHOXAZOLE-TRIMETHOPRIM 800-160 MG PO TABS: 1.0000 | ORAL_TABLET | Freq: Two times a day (BID) | ORAL | 0 refills | Status: DC

## 2019-06-09 NOTE — Progress Notes (Signed)
We are sorry that you are not feeling well.  Here is how we plan to help!  Given that you recently had a UTI  I would like you to follow up with your primary care provider as scheduled. It is important that they test your urine and perform a culture on your urine to assure we are treating this appropriately. Because you were recently on Keflex I would like to try another type of antibiotic ( Bactrim). If you develop any worsening belly pain or fever you need immediate evaluation.   Based on what you shared with me it looks like you most likely have a simple urinary tract infection.  A UTI (Urinary Tract Infection) is a bacterial infection of the bladder.  Most cases of urinary tract infections are simple to treat but a key part of your care is to encourage you to drink plenty of fluids and watch your symptoms carefully.  I have prescribed Bactrim DS for 7 days.  Your symptoms should gradually improve. Call us if the burning in your urine worsens, you develop worsening fever, back pain or pelvic pain or if your symptoms do not resolve after completing the antibiotic.  Urinary tract infections can be prevented by drinking plenty of water to keep your body hydrated.  Also be sure when you wipe, wipe from front to back and don't hold it in!  If possible, empty your bladder every 4 hours.  Your e-visit answers were reviewed by a board certified advanced clinical practitioner to complete your personal care plan.  Depending on the condition, your plan could have included both over the counter or prescription medications.  If there is a problem please reply  once you have received a response from your provider.  Your safety is important to [place].  If you have drug allergies check your prescription carefully.    You can use MyChart to ask questions about today's visit, request a non-urgent call back, or ask for a work or school excuse for 24 hours related to this e-Visit. If it has been greater than 24 hours  you will need to follow up with your provider, or enter a new e-Visit to address those concerns.   You will get an e-mail in the next two days asking about your experience.  I hope that your e-visit has been valuable and will speed your recovery. Thank you for using e-visits.  Greater than 5 minutes, yet less than 10 minutes of time have been spent researching, coordinating, and implementing care for this patient today

## 2019-06-16 DIAGNOSIS — J3089 Other allergic rhinitis: Secondary | ICD-10-CM | POA: Diagnosis not present

## 2019-06-16 DIAGNOSIS — J301 Allergic rhinitis due to pollen: Secondary | ICD-10-CM | POA: Diagnosis not present

## 2019-06-21 DIAGNOSIS — J301 Allergic rhinitis due to pollen: Secondary | ICD-10-CM | POA: Diagnosis not present

## 2019-06-21 DIAGNOSIS — J3089 Other allergic rhinitis: Secondary | ICD-10-CM | POA: Diagnosis not present

## 2019-06-24 DIAGNOSIS — J301 Allergic rhinitis due to pollen: Secondary | ICD-10-CM | POA: Diagnosis not present

## 2019-06-24 DIAGNOSIS — J3089 Other allergic rhinitis: Secondary | ICD-10-CM | POA: Diagnosis not present

## 2019-06-30 DIAGNOSIS — J301 Allergic rhinitis due to pollen: Secondary | ICD-10-CM | POA: Diagnosis not present

## 2019-06-30 DIAGNOSIS — J3089 Other allergic rhinitis: Secondary | ICD-10-CM | POA: Diagnosis not present

## 2019-07-03 ENCOUNTER — Emergency Department (INDEPENDENT_AMBULATORY_CARE_PROVIDER_SITE_OTHER): Payer: 59

## 2019-07-03 ENCOUNTER — Emergency Department
Admission: EM | Admit: 2019-07-03 | Discharge: 2019-07-03 | Disposition: A | Discharge status: Home / Self Care | Payer: 59 | Source: Home / Self Care

## 2019-07-03 ENCOUNTER — Other Ambulatory Visit: Payer: Self-pay

## 2019-07-03 ENCOUNTER — Encounter: Payer: Self-pay | Admitting: Family Medicine

## 2019-07-03 DIAGNOSIS — R1032 Left lower quadrant pain: Secondary | ICD-10-CM

## 2019-07-03 DIAGNOSIS — K5732 Diverticulitis of large intestine without perforation or abscess without bleeding: Secondary | ICD-10-CM

## 2019-07-03 LAB — POCT URINALYSIS DIP (MANUAL ENTRY)
Bilirubin, UA: NEGATIVE
Blood, UA: NEGATIVE
Glucose, UA: NEGATIVE mg/dL
Ketones, POC UA: NEGATIVE mg/dL
Leukocytes, UA: NEGATIVE
Nitrite, UA: NEGATIVE
Protein Ur, POC: NEGATIVE mg/dL
Spec Grav, UA: 1.02 (ref 1.010–1.025)
Urobilinogen, UA: 0.2 E.U./dL
pH, UA: 7.5 (ref 5.0–8.0)

## 2019-07-03 MED ORDER — AMOXICILLIN-POT CLAVULANATE 875-125 MG PO TABS: 1.0000 | ORAL_TABLET | Freq: Two times a day (BID) | ORAL | 0 refills | Status: DC

## 2019-07-03 NOTE — Discharge Instructions (Addendum)
Keep your appointment Tuesday with Dr. Collene Mares.

## 2019-07-03 NOTE — ED Provider Notes (Signed)
[facility]    CSN: [id number] Arrival date & time: 07/03/19  1127      History   Chief Complaint Chief Complaint  Patient presents with   Abdominal Pain    HPI Amy [place] is a 63 y.o. female.   63 yo established [facility] patient who is presenting with abdominal pain.  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  When she was taking Cipro she became constipated and started developing left lower quadrant pain.  She is tried drinking more fluids and taking stool softeners as well as MiraLAX.  Nevertheless she has continued to have increasing left lower quadrant pain without chills or fever.  Patient has had a colonoscopy in the past and was told she had diverticulosis.  She is never had diverticulitis.  Patient has no urinary tract infection symptoms, nausea, vomiting.  Her last bowel movements yesterday.  Patient is undergone a hysterectomy and was thinking that maybe her abdominal pain was related to adhesions.     Past Medical History:  Diagnosis Date   Allergy    Anxiety    Arrhythmia    PVCs- no meds   Asthma    Basal cell carcinoma of nose 08/19/2017   Diverticulitis 08/05/2012   Dr Collene Mares   GERD (gastroesophageal reflux disease)    Ovarian cyst    recurrent   PVC's (premature ventricular contractions)    Uterine fibroid 2006   16 week size    Patient Active Problem List   Diagnosis Date Noted   Fibroid, uterine 06/12/2015   Leiomyoma of uterus, unspecified 12/29/2014   Abnormal uterine bleeding 12/29/2014   Enlarged uterus 07/28/2014   Fibroids 05/24/2013   Post-menopausal bleeding 05/24/2013   Diverticulosis 07/27/2012   Colon polyps 07/27/2012   HYPERLIPIDEMIA-MIXED 10/04/2010   Palpitations 10/04/2010   DYSPNEA 10/04/2010   DYSPNEA ON EXERTION 10/04/2010    Past Surgical History:    Procedure Laterality Date   ABDOMINAL HYSTERECTOMY N/A 06/12/2015   Procedure: HYSTERECTOMY ABDOMINAL With Vertical Incision;  Surgeon: Eldred Manges, MD;   BILATERAL SALPINGECTOMY  06/12/2015   Procedure: BILATERAL SALPINGECTOMY;  Surgeon: Eldred Manges, MD;  Location: [facility];  Service: Gynecology;;   DILATATION & CURETTAGE/HYSTEROSCOPY WITH TRUECLEAR N/A 09/08/2013   Procedure: DILATATION & CURETTAGE/HYSTEROSCOPY WITH TRUCLEAR;  Surgeon: Azalia Bilis, MD;  Location: [facility];  Service: Gynecology;  Laterality: N/A;   DILATION AND CURETTAGE OF UTERUS     D&E x2 missed ab   UPPER GI ENDOSCOPY  07/23/12   grade 1 esophagitis noted:  otherwise normal esophagogastrduodenoscopy    OB History    Gravida  4   Para  2   Term  2   Preterm      AB  2   Living  2     SAB  2   TAB      Ectopic      Multiple      Live Births  2            Home Medications    Prior to Admission medications   Medication Sig Start Date End Date Taking? Authorizing Provider  beclomethasone (QVAR) 40 MCG/ACT inhaler Inhale into the lungs 2 (two) times daily.   Yes [provider]  albuterol (VENTOLIN HFA) 108 (90 Base) MCG/ACT inhaler Inhale 2 puffs into the lungs every  6 (six) hours as needed for wheezing or shortness of breath. 05/27/19   Maximiano Coss, NP  ALPRAZolam Duanne Moron) 0.25 MG tablet TAKE 1 TABLET (0.25 MG TOTAL) BY MOUTH AT BEDTIME AS NEEDED FOR ANXIETY OR SLEEP. 04/13/19   [name], MD  amoxicillin-clavulanate (AUGMENTIN) 875-125 MG tablet Take 1 tablet by mouth every 12 (twelve) hours. 07/03/19   Robyn Haber, MD  beclomethasone (QVAR REDIHALER) 80 MCG/ACT inhaler Inhale 4 puffs into the lungs 2 (two) times daily. 01/25/19   [name], MD  cetirizine (ZYRTEC) 10 MG tablet Take 10 mg by mouth at bedtime.     [provider]  esomeprazole (NEXIUM) 40 MG capsule Take 1 capsule (40 mg total) by mouth daily. before a meal 06/18/18   Shawnee Knapp, MD  estradiol (ESTRACE) 0.1 MG/GM vaginal cream INSERT 0 5 GM VAGINALLY TWICE A WEEK AS DIRECTED 11/16/17   Haygood, Seymour Bars, MD  fluticasone (FLONASE) 50 MCG/ACT nasal spray Place 2 sprays into both nostrils as needed for allergies. 03/11/16   Shawnee Knapp, MD  propranolol (INDERAL) 40 MG tablet Take 40 mg by mouth 4 (four) times daily.    [provider]  valACYclovir (VALTREX) 1000 MG tablet Take 1 tablet (1,000 mg total) by mouth daily. For 5 days at first sign of outbreak 03/11/16   Shawnee Knapp, MD    Family History Family History  Problem Relation Age of Onset   Arthritis Mother    Osteoporosis Mother    Breast cancer Mother    COPD Mother    Lung cancer Mother    COPD Father    Heart disease Father    Lung cancer Father    Hypertension Sister    Heart disease Sister    Arthritis Sister    Hypertension Brother     Social History Social History   Tobacco Use   Smoking status: Never Smoker   Smokeless tobacco: Never Used  Substance Use Topics   Alcohol use: Yes    Comment: social/occasional   Drug use: No     Allergies   Peanuts [peanut oil], Shellfish allergy, and Strawberry (diagnostic)   Review of Systems Review of Systems  Constitutional: Negative.   Gastrointestinal: Positive for abdominal pain and constipation. Negative for nausea and vomiting.     Physical Exam Triage Vital Signs ED Triage Vitals  Enc Vitals Group     BP      Pulse      Resp      Temp      Temp src      SpO2      Weight      Height      Head Circumference      Peak Flow      Pain Score      Pain Loc      Pain Edu?      Excl. in [place]?    No data found.  Updated Vital Signs BP 137/82 (BP Location: Right Arm)    Pulse 93    Temp 98.7 F (37.1 C) (Oral)    Resp 18    Ht 5\' 3"  (1.6 m)    Wt 62.6 kg    LMP  (LMP Unknown)    SpO2 96%    BMI 24.45 kg/m    Physical Exam Vitals signs and nursing note reviewed.  Constitutional:      General: She is  not in acute distress.    Appearance:  She is well-developed and normal weight. She is not ill-appearing.  HENT:     Mouth/Throat:     Mouth: Mucous membranes are moist.  Eyes:     Extraocular Movements: Extraocular movements intact.  Cardiovascular:     Rate and Rhythm: Normal rate.  Pulmonary:     Effort: Pulmonary effort is normal.  Abdominal:     General: Abdomen is flat. Bowel sounds are normal.     Palpations: Abdomen is soft.     Tenderness: There is abdominal tenderness in the left lower quadrant. There is guarding and rebound.  Skin:    General: Skin is warm and dry.  Neurological:     General: No focal deficit present.     Mental Status: She is alert.  Psychiatric:        Mood and Affect: Mood normal.        Behavior: Behavior normal.      UC Treatments / Results  Labs (all labs ordered are listed, but only abnormal results are displayed) Labs Reviewed  POCT URINALYSIS DIP (MANUAL ENTRY)  POCT CBC W AUTO DIFF ([facility])    EKG   Radiology Nonspecific gas pattern on KUB Procedures Procedures (including critical care time)  Medications Ordered in UC Medications - No data to display  Initial Impression / Assessment and Plan / UC Course  I have reviewed the triage vital signs and the nursing notes.  Pertinent labs & imaging results that were available during my care of the patient were reviewed by me and considered in my medical decision making (see chart for details).    Final Clinical Impressions(s) / UC Diagnoses   Final diagnoses:  Diverticulitis of colon     Discharge Instructions     Keep your appointment Tuesday with Dr. Collene Mares.    ED Prescriptions    Medication Sig Dispense Auth. Provider   amoxicillin-clavulanate (AUGMENTIN) 875-125 MG tablet Take 1 tablet by mouth every 12 (twelve) hours. 20 tablet Robyn Haber, MD     I have reviewed the PDMP during this encounter.   Robyn Haber, MD 07/03/19 1249

## 2019-07-03 NOTE — ED Triage Notes (Signed)
Pt c/o abdominal cramping x 4 nights. Has done evisits last 4 weeks. Has at home test strips which came up pos for leukocytes. Had done two different antibiotics (Keflex and Bactrim) Bactrim caused constipation. Cramping returning 4 nights ago.

## 2019-07-04 ENCOUNTER — Telehealth (INDEPENDENT_AMBULATORY_CARE_PROVIDER_SITE_OTHER): Payer: 59

## 2019-07-04 DIAGNOSIS — K5732 Diverticulitis of large intestine without perforation or abscess without bleeding: Secondary | ICD-10-CM | POA: Diagnosis not present

## 2019-07-04 LAB — POCT CBC W AUTO DIFF (K'VILLE URGENT CARE)

## 2019-07-05 ENCOUNTER — Other Ambulatory Visit (HOSPITAL_COMMUNITY): Payer: Self-pay | Admitting: Gastroenterology

## 2019-07-05 ENCOUNTER — Other Ambulatory Visit: Payer: Self-pay | Admitting: Gastroenterology

## 2019-07-05 DIAGNOSIS — K219 Gastro-esophageal reflux disease without esophagitis: Secondary | ICD-10-CM | POA: Diagnosis not present

## 2019-07-05 DIAGNOSIS — R1032 Left lower quadrant pain: Secondary | ICD-10-CM | POA: Diagnosis not present

## 2019-07-05 DIAGNOSIS — K5730 Diverticulosis of large intestine without perforation or abscess without bleeding: Secondary | ICD-10-CM | POA: Diagnosis not present

## 2019-07-05 DIAGNOSIS — K5732 Diverticulitis of large intestine without perforation or abscess without bleeding: Secondary | ICD-10-CM | POA: Diagnosis not present

## 2019-07-05 DIAGNOSIS — Z86010 Personal history of colonic polyps: Secondary | ICD-10-CM | POA: Diagnosis not present

## 2019-07-05 DIAGNOSIS — Z1211 Encounter for screening for malignant neoplasm of colon: Secondary | ICD-10-CM | POA: Diagnosis not present

## 2019-07-08 DIAGNOSIS — J301 Allergic rhinitis due to pollen: Secondary | ICD-10-CM | POA: Diagnosis not present

## 2019-07-08 DIAGNOSIS — J3089 Other allergic rhinitis: Secondary | ICD-10-CM | POA: Diagnosis not present

## 2019-07-12 ENCOUNTER — Ambulatory Visit (HOSPITAL_COMMUNITY)
Admission: RE | Admit: 2019-07-12 | Discharge: 2019-07-12 | Disposition: A | Discharge status: Home / Self Care | Payer: 59 | Source: Ambulatory Visit | Attending: Gastroenterology | Admitting: Gastroenterology

## 2019-07-12 ENCOUNTER — Other Ambulatory Visit: Payer: Self-pay

## 2019-07-12 DIAGNOSIS — R1032 Left lower quadrant pain: Secondary | ICD-10-CM | POA: Insufficient documentation

## 2019-07-12 DIAGNOSIS — K5732 Diverticulitis of large intestine without perforation or abscess without bleeding: Secondary | ICD-10-CM | POA: Diagnosis not present

## 2019-07-12 MED ORDER — IOHEXOL 300 MG/ML  SOLN
80.0000 mL | Freq: Once | INTRAMUSCULAR | Status: AC | PRN
  Administered 2019-07-12: 80 mL via INTRAVENOUS

## 2019-07-12 MED ORDER — SODIUM CHLORIDE (PF) 0.9 % IJ SOLN
INTRAMUSCULAR | Status: AC
  Filled 2019-07-12: qty 50

## 2019-07-12 MED FILL — AMOX-CLAV 875-125 MG TABLET: 875-125 | 30 days supply | Qty: 60 | Fill #0

## 2019-07-12 MED FILL — QVAR REDIHALER 80 MCG/ACT A: 80 | 15 days supply | Qty: 11 | Fill #0

## 2019-07-15 DIAGNOSIS — J301 Allergic rhinitis due to pollen: Secondary | ICD-10-CM | POA: Diagnosis not present

## 2019-07-15 DIAGNOSIS — K5792 Diverticulitis of intestine, part unspecified, without perforation or abscess without bleeding: Secondary | ICD-10-CM | POA: Diagnosis not present

## 2019-07-15 DIAGNOSIS — J3089 Other allergic rhinitis: Secondary | ICD-10-CM | POA: Diagnosis not present

## 2019-07-15 DIAGNOSIS — R102 Pelvic and perineal pain: Secondary | ICD-10-CM | POA: Diagnosis not present

## 2019-07-15 DIAGNOSIS — Z0000 Encounter for general adult medical examination without abnormal findings: Secondary | ICD-10-CM | POA: Diagnosis not present

## 2019-07-18 ENCOUNTER — Other Ambulatory Visit: Payer: Self-pay | Admitting: Obstetrics and Gynecology

## 2019-07-18 DIAGNOSIS — I82890 Acute embolism and thrombosis of other specified veins: Secondary | ICD-10-CM

## 2019-07-22 ENCOUNTER — Ambulatory Visit (HOSPITAL_COMMUNITY)
Admission: RE | Admit: 2019-07-22 | Discharge: 2019-07-22 | Disposition: A | Discharge status: Home / Self Care | Payer: 59 | Source: Ambulatory Visit | Attending: Obstetrics and Gynecology | Admitting: Obstetrics and Gynecology

## 2019-07-22 ENCOUNTER — Encounter: Payer: Self-pay | Admitting: Obstetrics and Gynecology

## 2019-07-22 ENCOUNTER — Other Ambulatory Visit: Payer: Self-pay

## 2019-07-22 ENCOUNTER — Encounter (HOSPITAL_COMMUNITY): Payer: Self-pay

## 2019-07-22 DIAGNOSIS — I82890 Acute embolism and thrombosis of other specified veins: Secondary | ICD-10-CM | POA: Diagnosis not present

## 2019-07-22 DIAGNOSIS — K5732 Diverticulitis of large intestine without perforation or abscess without bleeding: Secondary | ICD-10-CM | POA: Diagnosis not present

## 2019-07-22 MED ORDER — SODIUM CHLORIDE (PF) 0.9 % IJ SOLN
INTRAMUSCULAR | Status: AC
  Filled 2019-07-22: qty 50

## 2019-07-22 MED ORDER — IOHEXOL 300 MG/ML  SOLN
150.0000 mL | Freq: Once | INTRAMUSCULAR | Status: AC | PRN
  Administered 2019-07-22: 150 mL via INTRAVENOUS

## 2019-08-08 DIAGNOSIS — N898 Other specified noninflammatory disorders of vagina: Secondary | ICD-10-CM | POA: Diagnosis not present

## 2019-08-08 DIAGNOSIS — R895 Abnormal microbiological findings in specimens from other organs, systems and tissues: Secondary | ICD-10-CM | POA: Diagnosis not present

## 2019-08-08 DIAGNOSIS — N939 Abnormal uterine and vaginal bleeding, unspecified: Secondary | ICD-10-CM | POA: Diagnosis not present

## 2019-08-08 MED FILL — FLUCONAZOLE 150 MG TABLET: 150 | 1 days supply | Qty: 1 | Fill #0

## 2019-08-09 ENCOUNTER — Other Ambulatory Visit: Payer: Self-pay | Admitting: Emergency Medicine

## 2019-08-12 ENCOUNTER — Other Ambulatory Visit: Payer: Self-pay

## 2019-08-12 DIAGNOSIS — J3089 Other allergic rhinitis: Secondary | ICD-10-CM | POA: Diagnosis not present

## 2019-08-12 DIAGNOSIS — J301 Allergic rhinitis due to pollen: Secondary | ICD-10-CM | POA: Diagnosis not present

## 2019-08-12 MED ORDER — ESOMEPRAZOLE MAGNESIUM 40 MG PO CPDR: 40.0000 mg | DELAYED_RELEASE_CAPSULE | Freq: Every day | ORAL | 0 refills | Status: DC

## 2019-08-12 MED FILL — GAVILYTE-C SOLUTION: 240 | 1 days supply | Qty: 4000 | Fill #0

## 2019-08-12 MED FILL — ESOMEPRAZOLE MAG DR 40 MG C: 40 | 30 days supply | Qty: 30 | Fill #0

## 2019-08-16 DIAGNOSIS — D485 Neoplasm of uncertain behavior of skin: Secondary | ICD-10-CM | POA: Diagnosis not present

## 2019-08-16 DIAGNOSIS — Z85828 Personal history of other malignant neoplasm of skin: Secondary | ICD-10-CM | POA: Diagnosis not present

## 2019-08-16 DIAGNOSIS — L570 Actinic keratosis: Secondary | ICD-10-CM | POA: Diagnosis not present

## 2019-08-16 DIAGNOSIS — L719 Rosacea, unspecified: Secondary | ICD-10-CM | POA: Diagnosis not present

## 2019-08-16 DIAGNOSIS — B009 Herpesviral infection, unspecified: Secondary | ICD-10-CM | POA: Diagnosis not present

## 2019-08-16 DIAGNOSIS — D225 Melanocytic nevi of trunk: Secondary | ICD-10-CM | POA: Diagnosis not present

## 2019-08-16 DIAGNOSIS — L814 Other melanin hyperpigmentation: Secondary | ICD-10-CM | POA: Diagnosis not present

## 2019-08-16 DIAGNOSIS — L821 Other seborrheic keratosis: Secondary | ICD-10-CM | POA: Diagnosis not present

## 2019-08-16 DIAGNOSIS — C44519 Basal cell carcinoma of skin of other part of trunk: Secondary | ICD-10-CM | POA: Diagnosis not present

## 2019-08-16 DIAGNOSIS — Z411 Encounter for cosmetic surgery: Secondary | ICD-10-CM | POA: Diagnosis not present

## 2019-08-16 MED FILL — DOXYCYCLINE HYCLATE 100 MG: 100 | 30 days supply | Qty: 30 | Fill #0

## 2019-08-16 MED FILL — valACYclovir HCL 1 GM TABS: 1 | 8 days supply | Qty: 30 | Fill #0

## 2019-08-16 MED FILL — TRETINOIN 0.025% CREAM: 0.025 | 30 days supply | Qty: 20 | Fill #0

## 2019-08-16 MED FILL — FLUCONAZOLE 150 MG TABLET: 150 | 1 days supply | Qty: 1 | Fill #1

## 2019-08-29 DIAGNOSIS — R05 Cough: Secondary | ICD-10-CM | POA: Diagnosis not present

## 2019-08-29 DIAGNOSIS — T781XXD Other adverse food reactions, not elsewhere classified, subsequent encounter: Secondary | ICD-10-CM | POA: Diagnosis not present

## 2019-08-29 DIAGNOSIS — J3089 Other allergic rhinitis: Secondary | ICD-10-CM | POA: Diagnosis not present

## 2019-08-29 DIAGNOSIS — J301 Allergic rhinitis due to pollen: Secondary | ICD-10-CM | POA: Diagnosis not present

## 2019-08-29 MED FILL — QVAR REDIHALER 80 MCG/ACT A: 80 | 30 days supply | Qty: 11 | Fill #0

## 2019-08-29 MED FILL — LEVOCETIRIZINE 5 MG TABLET: 5 | 30 days supply | Qty: 30 | Fill #0

## 2019-08-29 MED FILL — FLUTICASONE PROP 50 MCG SPR: 50 | 30 days supply | Qty: 16 | Fill #0

## 2019-09-08 MED FILL — QVAR REDIHALER 80 MCG/ACT A: 80 | 30 days supply | Qty: 11 | Fill #0

## 2019-09-08 MED FILL — LEVOCETIRIZINE 5 MG TABLET: 5 | 30 days supply | Qty: 30 | Fill #0

## 2019-09-13 DIAGNOSIS — C44519 Basal cell carcinoma of skin of other part of trunk: Secondary | ICD-10-CM | POA: Diagnosis not present

## 2019-09-13 DIAGNOSIS — L309 Dermatitis, unspecified: Secondary | ICD-10-CM | POA: Diagnosis not present

## 2019-09-13 DIAGNOSIS — L821 Other seborrheic keratosis: Secondary | ICD-10-CM | POA: Diagnosis not present

## 2019-09-13 MED FILL — TRIAMCINOLONE ACETONIDE 0.1: 0.1 | 14 days supply | Qty: 80 | Fill #0

## 2019-10-07 ENCOUNTER — Other Ambulatory Visit: Payer: Self-pay

## 2019-10-07 ENCOUNTER — Ambulatory Visit: Payer: 59 | Admitting: Family Medicine

## 2019-10-07 VITALS — BP 145/83 | HR 92 | Temp 98.4°F | Ht 63.0 in | Wt 130.6 lb

## 2019-10-07 DIAGNOSIS — K219 Gastro-esophageal reflux disease without esophagitis: Secondary | ICD-10-CM

## 2019-10-07 DIAGNOSIS — F419 Anxiety disorder, unspecified: Secondary | ICD-10-CM | POA: Diagnosis not present

## 2019-10-07 DIAGNOSIS — Z7189 Other specified counseling: Secondary | ICD-10-CM | POA: Diagnosis not present

## 2019-10-07 DIAGNOSIS — Z8719 Personal history of other diseases of the digestive system: Secondary | ICD-10-CM | POA: Diagnosis not present

## 2019-10-07 MED ORDER — ESOMEPRAZOLE MAGNESIUM 40 MG PO CPDR: 40.0000 mg | DELAYED_RELEASE_CAPSULE | Freq: Every day | ORAL | 3 refills | Status: DC

## 2019-10-07 MED ORDER — ALPRAZOLAM 0.25 MG PO TABS: 0.2500 mg | ORAL_TABLET | Freq: Every evening | ORAL | 0 refills | Status: DC | PRN

## 2019-10-07 MED FILL — ESOMEPRAZOLE MAG DR 40 MG C: 40 | 90 days supply | Qty: 90 | Fill #0

## 2019-10-07 MED FILL — ALPRAZolam 0.25 MG TABS: 0.25 | 30 days supply | Qty: 30 | Fill #0

## 2019-10-07 NOTE — Progress Notes (Signed)
Subjective:  Patient ID: Amy Phillips, female    DOB: December 12, 1955  Age: 64 y.o. MRN: [id number]  CC:  Chief Complaint  Patient presents with  . Medication Refill    pt states she needs refills on her XANAX and [place]. pt stats these medications are working well with no side effects.  . lab testing    pt wants a covid antibody test because she was sick for 2 months last spring and is curious if she had COVID back then. pt stats another doctor wanthed her to get a hep C and HIV labs done as well.    HPI Amy Phillips presents for   Anxiety: Overall doing ok. Episodic use, still has about 7 pills left. Every few weeks. No new side effects. Not having daily symptoms. 1/2 pill works well for relief of symptoms usually - rare full pill. Hormonal component.  Tried paxil, lexapro in past - too nauseous.   Last filled for # 30 on 04/13/19.  Controlled substance database (PDMP) reviewed. No concerns appreciated.   GERD: Prior treated by GI - Dr. Collene Mares - endoscopy in 2011? Looked ok. Recommended daily PPI. Controlled with daily Nexium.  Breakthrough sx's off med.   OTC AZO Strip positive for nitrite in August. 2 e visits for UTI - keflex, then bactrim. Continue pelvic pain. 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 Low risk - plan for repeat CTA in 1 year.  Planning for appt with GI - Dr. Collene Mares soon to discuss fatty liver, ct abnormalities and diverticulitis.  Will discuss hepatitis testing with GI.   Question about covid antibody test re: illness last year. -April, May. More asthma then - no fever..  After discussion- decided to hold off on antibodies.  Out of work for scratchy throat. No fever/smell or taste change. Negative covid test on 1/12.. has not yet had covid vaccine. On allergy shots. Plans on waiting d/t inlfammation and possible flair of diverticulitis. followed  by allergist for asthma.          History Patient Active Problem List   Diagnosis Date Noted  . Fibroid, uterine 06/12/2015  . Leiomyoma of uterus, unspecified 12/29/2014  . Abnormal uterine bleeding 12/29/2014  . Enlarged uterus 07/28/2014  . Fibroids 05/24/2013  . Post-menopausal bleeding 05/24/2013  . Diverticulosis 07/27/2012  . Colon polyps 07/27/2012  . HYPERLIPIDEMIA-MIXED 10/04/2010  . Palpitations 10/04/2010  . DYSPNEA 10/04/2010  . DYSPNEA ON EXERTION 10/04/2010   Past Medical History:  Diagnosis Date  . Allergy   . Anxiety   . Arrhythmia    PVCs- no meds  . Asthma   . Basal cell carcinoma of nose 08/19/2017  . Diverticulitis 08/05/2012   Dr Collene Mares  . GERD (gastroesophageal reflux disease)   . Ovarian cyst    recurrent  . PVC's (premature ventricular contractions)   . Uterine fibroid 2006   16 week size   Past Surgical History:  Procedure Laterality Date  . ABDOMINAL HYSTERECTOMY N/A 06/12/2015   Procedure: HYSTERECTOMY ABDOMINAL With Vertical Incision;  Surgeon: Eldred Manges, MD;  . BILATERAL SALPINGECTOMY  06/12/2015   Procedure: BILATERAL SALPINGECTOMY;  Surgeon: Eldred Manges, MD;  Location: [facility];  Service: Gynecology;;  . DILATATION & CURETTAGE/HYSTEROSCOPY WITH TRUECLEAR N/A 09/08/2013   Procedure: DILATATION & CURETTAGE/HYSTEROSCOPY WITH TRUCLEAR;  Surgeon: Azalia Bilis, MD;  Location: [facility];  Service: Gynecology;  Laterality: N/A;  . DILATION AND CURETTAGE OF UTERUS     D&E x2 missed ab  . UPPER GI ENDOSCOPY  07/23/12   grade 1 esophagitis noted:  otherwise normal esophagogastrduodenoscopy   Allergies  Allergen Reactions  . Peanuts [Peanut Oil] Other (See Comments)    Mood changes per pt and husband   . Shellfish Allergy Other (See Comments)    Throat irritation  . Strawberry (Diagnostic)    Prior to Admission medications   Medication Sig Start Date End Date Taking? Authorizing Provider  albuterol (VENTOLIN HFA) 108 (90  Base) MCG/ACT inhaler Inhale 2 puffs into the lungs every 6 (six) hours as needed for wheezing or shortness of breath. 05/27/19  Yes Maximiano Coss, NP  ALPRAZolam Duanne Moron) 0.25 MG tablet TAKE 1 TABLET (0.25 MG TOTAL) BY MOUTH AT BEDTIME AS NEEDED FOR ANXIETY OR SLEEP. 04/13/19  Yes [name], MD  beclomethasone (QVAR REDIHALER) 80 MCG/ACT inhaler Inhale 4 puffs into the lungs 2 (two) times daily. 01/25/19  Yes [name], MD  cetirizine (ZYRTEC) 10 MG tablet Take 10 mg by mouth at bedtime.    Yes [provider]  DOXYCYCLINE PO Take 100 mg by mouth.   Yes [provider]  esomeprazole (NEXIUM) 40 MG capsule Take 1 capsule (40 mg total) by mouth daily. before a meal 08/12/19  Yes [name], MD  fluticasone [facility]) 50 MCG/ACT nasal spray Place 2 sprays into both nostrils as needed for allergies. 03/11/16  Yes Shawnee Knapp, MD  valACYclovir (VALTREX) 1000 MG tablet Take 1 tablet (1,000 mg total) by mouth daily. For 5 days at first sign of outbreak 03/11/16  Yes Shawnee Knapp, MD  amoxicillin-clavulanate (AUGMENTIN) 875-125 MG tablet Take 1 tablet by mouth every 12 (twelve) hours. Patient not taking: Reported on 10/07/2019 07/03/19   Robyn Haber, MD  beclomethasone (QVAR) 40 MCG/ACT inhaler Inhale into the lungs 2 (two) times daily.    [provider]  estradiol (ESTRACE) 0.1 MG/GM vaginal cream INSERT 0 5 GM VAGINALLY TWICE A WEEK AS DIRECTED 11/16/17   Haygood, Seymour Bars, MD  propranolol (INDERAL) 40 MG tablet Take 40 mg by mouth 4 (four) times daily.    [provider]   Social History   Socioeconomic History  . Marital status: Married    Spouse name: Not on file  . Number of children: 2  . Years of education: Not on file  . Highest education level: Not on file  Occupational History  . Not on file  Tobacco Use  . Smoking status: Never Smoker  . Smokeless tobacco: Never Used  Substance and Sexual Activity  . Alcohol use: Yes     Comment: social/occasional  . Drug use: No  . Sexual activity: Yes    Partners: Male    Birth control/protection: Surgical, Post-menopausal  Other Topics Concern  . Not on file  Social History Narrative  . Not on file   Social Determinants of Health   Financial Resource Strain:   . Difficulty of Paying Living Expenses: Not on file  Food Insecurity:   . Worried About Charity fundraiser in the Last Year: Not on file  . Ran Out of Food in the Last Year: Not on file  Transportation Needs:   . Lack of Transportation (Medical): Not on file  . Lack of Transportation (Non-Medical): Not on file  Physical Activity:   . Days of Exercise per Week: Not on file  . Minutes of  Exercise per Session: Not on file  Stress:   . Feeling of Stress : Not on file  Social Connections:   . Frequency of Communication with Friends and Family: Not on file  . Frequency of Social Gatherings with Friends and Family: Not on file  . Attends Religious Services: Not on file  . Active Member of Clubs or Organizations: Not on file  . Attends Archivist Meetings: Not on file  . Marital Status: Not on file  Intimate Partner Violence:   . Fear of Current or Ex-Partner: Not on file  . Emotionally Abused: Not on file  . Physically Abused: Not on file  . Sexually Abused: Not on file    Review of Systems   Objective:   Vitals:   10/07/19 1615  BP: (!) 145/83  Pulse: 92  Temp: 98.4 F (36.9 C)  TempSrc: Temporal  SpO2: 95%  Weight: 130 lb 9.6 oz (59.2 kg)  Height: 5\' 3"  (1.6 m)     Physical Exam Vitals reviewed.  Constitutional:      Appearance: She is well-developed.  HENT:     Head: Normocephalic and atraumatic.  Eyes:     Conjunctiva/sclera: Conjunctivae normal.     Pupils: Pupils are equal, round, and reactive to light.  Neck:     Vascular: No carotid bruit.  Cardiovascular:     Rate and Rhythm: Normal rate and regular rhythm.     Heart sounds: Normal heart sounds.  Pulmonary:       Effort: Pulmonary effort is normal. No respiratory distress.     Breath sounds: Normal breath sounds. No wheezing.  Abdominal:     Palpations: Abdomen is soft. There is no pulsatile mass.     Tenderness: There is no abdominal tenderness.  Skin:    General: Skin is warm and dry.  Neurological:     Mental Status: She is alert and oriented to person, place, and time.  Psychiatric:        Mood and Affect: Mood normal.        Behavior: Behavior normal.        Thought Content: Thought content normal.        Judgment: Judgment normal.        Assessment & Plan:  Amy Phillips is a 64 y.o. female . Gastroesophageal reflux disease without esophagitis - Plan: esomeprazole (NEXIUM) 40 MG capsule  -  Stable, tolerating current regimen. Medications refilled.  Anxiety - Plan: ALPRAZolam (XANAX) 0.25 MG tablet  - stable with infrequent meds, continue same.   History of diverticulitis  - follow up as planned with gastroenterology with rtc/er precautions.   Counseled about COVID-19 virus infection  - decided against antibody testing. Plans to discuss vaccine with her allergist, but no apparent contraindications.   Meds ordered this encounter  Medications  . ALPRAZolam (XANAX) 0.25 MG tablet    Sig: Take 1 tablet (0.25 mg total) by mouth at bedtime as needed for anxiety or sleep.    Dispense:  30 tablet    Refill:  0  . esomeprazole (NEXIUM) 40 MG capsule    Sig: Take 1 capsule (40 mg total) by mouth daily. before a meal    Dispense:  90 capsule    Refill:  3   Patient Instructions       If you have lab work done today you will be contacted with your lab results within the next 2 weeks.  If you have not heard from [place] then  please contact us. The fastest way to get your results is to register for My Chart.   IF you received an x-ray today, you will receive an invoice from [facility]. Please contact [facility] at [phone] with questions or concerns  regarding your invoice.   IF you received labwork today, you will receive an invoice from [facility]. Please contact [facility] at [phone] with questions or concerns regarding your invoice.   Our billing staff will not be able to assist you with questions regarding bills from these companies.  You will be contacted with the lab results as soon as they are available. The fastest way to get your results is to activate your My Chart account. Instructions are located on the last page of this paperwork. If you have not heard from [place] regarding the results in 2 weeks, please contact this office.         Signed, Merri Ray, MD [facility]

## 2019-10-07 NOTE — Patient Instructions (Signed)
° ° ° °  If you have lab work done today you will be contacted with your lab results within the next 2 weeks.  If you have not heard from us then please contact us. The fastest way to get your results is to register for My Chart. ° ° °IF you received an x-ray today, you will receive an invoice from [facility]. Please contact [facility] at [phone] with questions or concerns regarding your invoice.  ° °IF you received labwork today, you will receive an invoice from [facility]. Please contact [facility] at [phone] with questions or concerns regarding your invoice.  ° °Our billing staff will not be able to assist you with questions regarding bills from these companies. ° °You will be contacted with the lab results as soon as they are available. The fastest way to get your results is to activate your My Chart account. Instructions are located on the last page of this paperwork. If you have not heard from us regarding the results in 2 weeks, please contact this office. °  ° ° ° °

## 2019-10-08 ENCOUNTER — Encounter: Payer: Self-pay | Admitting: Family Medicine

## 2019-10-14 MED FILL — LEVOCETIRIZINE 5 MG TABLET: 5 | 30 days supply | Qty: 30 | Fill #1

## 2019-10-18 DIAGNOSIS — K5731 Diverticulosis of large intestine without perforation or abscess with bleeding: Secondary | ICD-10-CM | POA: Diagnosis not present

## 2019-10-18 DIAGNOSIS — K5732 Diverticulitis of large intestine without perforation or abscess without bleeding: Secondary | ICD-10-CM | POA: Diagnosis not present

## 2019-10-18 DIAGNOSIS — K219 Gastro-esophageal reflux disease without esophagitis: Secondary | ICD-10-CM | POA: Diagnosis not present

## 2019-10-18 DIAGNOSIS — Z86010 Personal history of colonic polyps: Secondary | ICD-10-CM | POA: Diagnosis not present

## 2019-10-18 MED FILL — AMOX-CLAV 875-125 MG TABLET: 875-125 | 30 days supply | Qty: 60 | Fill #0

## 2019-10-18 MED FILL — ONDANSETRON ODT 4 MG TABLET: 4 | 30 days supply | Qty: 90 | Fill #0

## 2019-11-08 DIAGNOSIS — K5731 Diverticulosis of large intestine without perforation or abscess with bleeding: Secondary | ICD-10-CM | POA: Diagnosis not present

## 2019-11-08 DIAGNOSIS — R1032 Left lower quadrant pain: Secondary | ICD-10-CM | POA: Diagnosis not present

## 2019-11-08 DIAGNOSIS — K219 Gastro-esophageal reflux disease without esophagitis: Secondary | ICD-10-CM | POA: Diagnosis not present

## 2019-11-08 MED FILL — CEPHALEXIN 500 MG CAPSULE: 500 | 10 days supply | Qty: 20 | Fill #0

## 2019-11-09 MED FILL — CIPROFLOXACIN HCL 500 MG TA: 500 | 10 days supply | Qty: 20 | Fill #0

## 2019-11-09 MED FILL — metroNIDAZOLE 250 MG TABS: 250 | 7 days supply | Qty: 21 | Fill #0

## 2019-11-15 MED FILL — metroNIDAZOLE 250 MG TABS: 250 | 7 days supply | Qty: 21 | Fill #0

## 2019-11-17 MED FILL — CIPROFLOXACIN HCL 500 MG TA: 500 | 4 days supply | Qty: 8 | Fill #0

## 2019-11-29 DIAGNOSIS — Z86010 Personal history of colonic polyps: Secondary | ICD-10-CM | POA: Diagnosis not present

## 2019-11-29 DIAGNOSIS — B379 Candidiasis, unspecified: Secondary | ICD-10-CM | POA: Diagnosis not present

## 2019-11-29 DIAGNOSIS — K5731 Diverticulosis of large intestine without perforation or abscess with bleeding: Secondary | ICD-10-CM | POA: Diagnosis not present

## 2019-11-29 MED FILL — FLUCONAZOLE 100 MG TABLET: 100 | 2 days supply | Qty: 2 | Fill #0

## 2019-12-15 MED FILL — QVAR REDIHALER 80 MCG/ACT A: 80 | 30 days supply | Qty: 11 | Fill #1

## 2019-12-15 MED FILL — LEVOCETIRIZINE 5 MG TABLET: 5 | 30 days supply | Qty: 30 | Fill #2

## 2019-12-16 MED FILL — FLUCONAZOLE 100 MG TABLET: 100 | 2 days supply | Qty: 2 | Fill #1

## 2020-01-12 DIAGNOSIS — H2513 Age-related nuclear cataract, bilateral: Secondary | ICD-10-CM | POA: Diagnosis not present

## 2020-01-12 DIAGNOSIS — H524 Presbyopia: Secondary | ICD-10-CM | POA: Diagnosis not present

## 2020-01-12 DIAGNOSIS — H43813 Vitreous degeneration, bilateral: Secondary | ICD-10-CM | POA: Diagnosis not present

## 2020-01-12 DIAGNOSIS — H25013 Cortical age-related cataract, bilateral: Secondary | ICD-10-CM | POA: Diagnosis not present

## 2020-01-12 DIAGNOSIS — H35363 Drusen (degenerative) of macula, bilateral: Secondary | ICD-10-CM | POA: Diagnosis not present

## 2020-01-24 ENCOUNTER — Other Ambulatory Visit: Payer: Self-pay | Admitting: Family Medicine

## 2020-01-24 DIAGNOSIS — F419 Anxiety disorder, unspecified: Secondary | ICD-10-CM

## 2020-01-24 NOTE — Telephone Encounter (Signed)
Requested medication (s) are due for refill today: yes  Requested medication (s) are on the active medication list: yes  Last refill: 10/07/19   #30  0 refills  Future visit scheduled  no   Notes to clinic: Not delegated  Requested Prescriptions  Pending Prescriptions Disp Refills   ALPRAZolam (XANAX) 0.25 MG tablet [Pharmacy Med Name: ALPRAZolam 0.25 MG TABS 0.25 Tablet] 30 tablet 0    Sig: Take 1 tablet (0.25 mg total) by mouth at bedtime as needed for anxiety or sleep.      Not Delegated - Psychiatry:  Anxiolytics/Hypnotics Failed - 01/24/2020  5:52 PM      Failed - This refill cannot be delegated      Failed - Urine Drug Screen completed in last 360 days.      Passed - Valid encounter within last 6 months    Recent Outpatient Visits           3 months ago Gastroesophageal reflux disease without esophagitis   [facility], Ranell Patrick, MD   9 months ago Discomfort of right ear   [facility], Ranell Patrick, MD   1 year ago Asthma with acute exacerbation, unspecified asthma severity, unspecified whether persistent   [facility], Ranell Patrick, MD   1 year ago Asthma with acute exacerbation, unspecified asthma severity, unspecified whether persistent   [facility], Ranell Patrick, MD   1 year ago Mild intermittent asthma with acute exacerbation   [facility], Ranell Patrick, MD

## 2020-01-25 NOTE — Telephone Encounter (Signed)
Patient is requesting a refill of the following medications: Requested Prescriptions   Pending Prescriptions Disp Refills  . ALPRAZolam (XANAX) 0.25 MG tablet [Pharmacy Med Name: ALPRAZolam 0.25 MG TABS 0.25 Tablet] 30 tablet 0    Sig: TAKE 1 TABLET (0.25 MG TOTAL) BY MOUTH AT BEDTIME AS NEEDED FOR ANXIETY OR SLEEP.    Date of patient request: 01/24/2020 Last office visit: 10/07/2019 Date of last refill: 10/07/2019 Last refill amount: 30 tablets  Follow up time period per chart:N/a

## 2020-01-26 MED FILL — ALPRAZolam 0.25 MG TABS: 0.25 | 30 days supply | Qty: 30 | Fill #0

## 2020-01-26 NOTE — Telephone Encounter (Signed)
Discussed med in [place] substance database (PDMP) reviewed. No concerns appreciated.  Last filled January 15, refill ordered

## 2020-01-27 MED FILL — LEVOCETIRIZINE 5 MG TABLET: 5 | 30 days supply | Qty: 30 | Fill #3

## 2020-01-30 DIAGNOSIS — E559 Vitamin D deficiency, unspecified: Secondary | ICD-10-CM | POA: Diagnosis not present

## 2020-01-30 DIAGNOSIS — Z1231 Encounter for screening mammogram for malignant neoplasm of breast: Secondary | ICD-10-CM | POA: Diagnosis not present

## 2020-01-30 DIAGNOSIS — Z8262 Family history of osteoporosis: Secondary | ICD-10-CM | POA: Diagnosis not present

## 2020-01-30 DIAGNOSIS — Z01419 Encounter for gynecological examination (general) (routine) without abnormal findings: Secondary | ICD-10-CM | POA: Diagnosis not present

## 2020-01-30 DIAGNOSIS — Z1239 Encounter for other screening for malignant neoplasm of breast: Secondary | ICD-10-CM | POA: Diagnosis not present

## 2020-01-30 DIAGNOSIS — Z1272 Encounter for screening for malignant neoplasm of vagina: Secondary | ICD-10-CM | POA: Diagnosis not present

## 2020-01-30 LAB — HM PAP SMEAR: HM Pap smear: NEGATIVE

## 2020-02-17 DIAGNOSIS — M8589 Other specified disorders of bone density and structure, multiple sites: Secondary | ICD-10-CM | POA: Diagnosis not present

## 2020-02-27 DIAGNOSIS — J301 Allergic rhinitis due to pollen: Secondary | ICD-10-CM | POA: Diagnosis not present

## 2020-02-27 DIAGNOSIS — R05 Cough: Secondary | ICD-10-CM | POA: Diagnosis not present

## 2020-02-27 DIAGNOSIS — T781XXD Other adverse food reactions, not elsewhere classified, subsequent encounter: Secondary | ICD-10-CM | POA: Diagnosis not present

## 2020-02-27 DIAGNOSIS — J3089 Other allergic rhinitis: Secondary | ICD-10-CM | POA: Diagnosis not present

## 2020-03-30 MED FILL — FLUCONAZOLE 100 MG TABLET: 100 | 2 days supply | Qty: 2 | Fill #0

## 2020-03-30 MED FILL — CIPROFLOXACIN HCL 500 MG TA: 500 | 30 days supply | Qty: 60 | Fill #0

## 2020-03-30 MED FILL — metroNIDAZOLE 250 MG TABS: 250 | 30 days supply | Qty: 90 | Fill #0

## 2020-04-17 ENCOUNTER — Other Ambulatory Visit: Payer: Self-pay | Admitting: Family Medicine

## 2020-04-17 DIAGNOSIS — F419 Anxiety disorder, unspecified: Secondary | ICD-10-CM

## 2020-04-17 MED FILL — QVAR REDIHALER 80 MCG/ACT A: 80 | 30 days supply | Qty: 11 | Fill #1

## 2020-04-17 MED FILL — DOXYCYCLINE HYCLATE 100 MG: 100 | 30 days supply | Qty: 30 | Fill #1

## 2020-04-17 MED FILL — ALPRAZolam 0.25 MG TABS: 0.25 | 30 days supply | Qty: 30 | Fill #0

## 2020-04-17 MED FILL — ALBUTEROL SULFATE HFA 108 (: 108 (90 BAS | 25 days supply | Qty: 18 | Fill #0

## 2020-04-17 NOTE — Telephone Encounter (Signed)
Last office visit in January.  Prescription last filled in May. Controlled substance database (PDMP) reviewed. No concerns appreciated.  Refill ordered

## 2020-04-17 NOTE — Telephone Encounter (Signed)
Patient is requesting a refill of the following medications: Requested Prescriptions   Pending Prescriptions Disp Refills   ALPRAZolam (XANAX) 0.25 MG tablet [Pharmacy Med Name: ALPRAZolam 0.25 MG TABS 0.25 Tablet] 30 tablet 0    Sig: TAKE 1 TABLET (0.25 MG TOTAL) BY MOUTH AT BEDTIME AS NEEDED FOR ANXIETY OR SLEEP.    Date of patient request: 04/17/2020 Last office visit: 10/07/2019 Date of last refill: 01/26/2020 Last refill amount: 30

## 2020-04-17 NOTE — Telephone Encounter (Signed)
Requested medication (s) are due for refill today:   unknown  Requested medication (s) are on the active medication list:   Yes  Future visit scheduled:   No   Last ordered: Non delegated refill   Requested Prescriptions  Pending Prescriptions Disp Refills   ALPRAZolam (XANAX) 0.25 MG tablet [Pharmacy Med Name: ALPRAZolam 0.25 MG TABS 0.25 Tablet] 30 tablet 0    Sig: TAKE 1 TABLET (0.25 MG TOTAL) BY MOUTH AT BEDTIME AS NEEDED FOR ANXIETY OR SLEEP.      Not Delegated - Psychiatry:  Anxiolytics/Hypnotics Failed - 04/17/2020 11:08 AM      Failed - This refill cannot be delegated      Failed - Urine Drug Screen completed in last 360 days.      Failed - Valid encounter within last 6 months    Recent Outpatient Visits           6 months ago Gastroesophageal reflux disease without esophagitis   [facility], Ranell Patrick, MD   11 months ago Discomfort of right ear   [facility], Ranell Patrick, MD   1 year ago Asthma with acute exacerbation, unspecified asthma severity, unspecified whether persistent   [facility], Ranell Patrick, MD   1 year ago Asthma with acute exacerbation, unspecified asthma severity, unspecified whether persistent   [facility], Ranell Patrick, MD   1 year ago Mild intermittent asthma with acute exacerbation   [facility], Ranell Patrick, MD

## 2020-05-04 ENCOUNTER — Ambulatory Visit: Payer: 59 | Admitting: Family Medicine

## 2020-05-10 ENCOUNTER — Encounter: Payer: Self-pay | Admitting: Family Medicine

## 2020-05-10 ENCOUNTER — Other Ambulatory Visit: Payer: Self-pay

## 2020-05-10 ENCOUNTER — Ambulatory Visit (INDEPENDENT_AMBULATORY_CARE_PROVIDER_SITE_OTHER): Payer: 59 | Admitting: Family Medicine

## 2020-05-10 VITALS — BP 122/79 | HR 80 | Temp 97.9°F | Ht 63.0 in | Wt 119.0 lb

## 2020-05-10 DIAGNOSIS — Z7189 Other specified counseling: Secondary | ICD-10-CM

## 2020-05-10 DIAGNOSIS — J4540 Moderate persistent asthma, uncomplicated: Secondary | ICD-10-CM

## 2020-05-10 DIAGNOSIS — Z8719 Personal history of other diseases of the digestive system: Secondary | ICD-10-CM

## 2020-05-10 DIAGNOSIS — Z7185 Encounter for immunization safety counseling: Secondary | ICD-10-CM

## 2020-05-10 DIAGNOSIS — J309 Allergic rhinitis, unspecified: Secondary | ICD-10-CM

## 2020-05-10 NOTE — Progress Notes (Signed)
Subjective:  Patient ID: Amy Phillips, female    DOB: [birth year]  Age: 64 y.o. MRN: [id number]  CC:  Chief Complaint  Patient presents with  . vaccine questions    Pt would like to talk to the PRovider about the COVID-19 vaccine. Pt has paper work she would like filled out to postpone getting the vaccine.  . Referral    pt would like a referral to GI. Pt isn't happy with her current GI provider and would like to see someone else.    HPI Amy Phillips presents for   Covid vaccine questions: Works as Marine scientist at [employer].  Followed by allergist, Dr. Donneta Romberg, appointment June 7.  Allergic rhinitis, asthma, and has taken allergy shots. In 2007 had allergy shots. Trouble walking up steps after shots - knees inflamed.  Started back on allergy shots in June/July of last year. Thought to have inflammation with allergy shots. Spilt up the shots. Still some inflammation - up to 101. Diverticulitis flare - would go to bed and had to take antibiotics for diverticulitis. Recent discussion with allergist and gastroenterology - did not think allergy shots were correlating to flair of diverticulitis.  She does not want to take covid vaccine.   Diverticulitis: Would like to meet with GI. Wants different opinion as concerned about chronic treatment of diverticulitis. Recurrent flairs in past - she thinks in part from prior prednisone use. Has been recommend to see surgeon.    History Patient Active Problem List   Diagnosis Date Noted  . Fibroid, uterine 06/12/2015  . Leiomyoma of uterus, unspecified 12/29/2014  . Abnormal uterine bleeding 12/29/2014  . Enlarged uterus 07/28/2014  . Fibroids 05/24/2013  . Post-menopausal bleeding 05/24/2013  . Diverticulosis 07/27/2012  . Colon polyps 07/27/2012  . HYPERLIPIDEMIA-MIXED 10/04/2010  . Palpitations 10/04/2010  . DYSPNEA 10/04/2010  . DYSPNEA ON EXERTION 10/04/2010   Past Medical History:  Diagnosis Date  . Allergy   . Anxiety   . Arrhythmia     PVCs- no meds  . Asthma   . Basal cell carcinoma of nose 08/19/2017  . Diverticulitis 08/05/2012   Dr Collene Mares  . GERD (gastroesophageal reflux disease)   . Ovarian cyst    recurrent  . PVC's (premature ventricular contractions)   . Uterine fibroid 2006   16 week size   Past Surgical History:  Procedure Laterality Date  . ABDOMINAL HYSTERECTOMY N/A 06/12/2015   Procedure: HYSTERECTOMY ABDOMINAL With Vertical Incision;  Surgeon: Eldred Manges, MD;  . BILATERAL SALPINGECTOMY  06/12/2015   Procedure: BILATERAL SALPINGECTOMY;  Surgeon: Eldred Manges, MD;  Location: [facility];  Service: Gynecology;;  . DILATATION & CURETTAGE/HYSTEROSCOPY WITH TRUECLEAR N/A 09/08/2013   Procedure: DILATATION & CURETTAGE/HYSTEROSCOPY WITH TRUCLEAR;  Surgeon: Azalia Bilis, MD;  Location: [facility];  Service: Gynecology;  Laterality: N/A;  . DILATION AND CURETTAGE OF UTERUS     D&E x2 missed ab  . UPPER GI ENDOSCOPY  07/23/12   grade 1 esophagitis noted:  otherwise normal esophagogastrduodenoscopy   Allergies  Allergen Reactions  . Peanuts [Peanut Oil] Other (See Comments)    Mood changes per pt and husband   . Shellfish Allergy Other (See Comments)    Throat irritation  . Strawberry (Diagnostic)    Prior to Admission medications   Medication Sig Start Date End Date Taking? Authorizing Provider  albuterol (VENTOLIN HFA) 108 (90 Base) MCG/ACT inhaler Inhale 2 puffs into the lungs every 6 (six) hours as needed for wheezing or  shortness of breath. 05/27/19  Yes Maximiano Coss, NP  ALPRAZolam Duanne Moron) 0.25 MG tablet TAKE 1 TABLET (0.25 MG TOTAL) BY MOUTH AT BEDTIME AS NEEDED FOR ANXIETY OR SLEEP. 04/17/20  Yes [name], MD  beclomethasone (QVAR REDIHALER) 80 MCG/ACT inhaler Inhale 4 puffs into the lungs 2 (two) times daily. 01/25/19  Yes [name], MD  cholecalciferol (VITAMIN D3) 25 MCG (1000 UNIT) tablet Take 1,000 Units by mouth daily.   Yes [provider]  docusate (COLACE) 50  MG/5ML liquid Take by mouth daily.   Yes [provider]  DOXYCYCLINE PO Take 100 mg by mouth.   Yes [provider]  esomeprazole (NEXIUM) 40 MG capsule Take 1 capsule (40 mg total) by mouth daily. before a meal 10/07/19  Yes [name], MD  fluticasone [facility]) 50 MCG/ACT nasal spray Place 2 sprays into both nostrils as needed for allergies. 03/11/16  Yes Shawnee Knapp, MD  levocetirizine (XYZAL) 5 MG tablet Take 5 mg by mouth every evening.   Yes [provider]  Magnesium 500 MG TABS Take by mouth.   Yes [provider]  valACYclovir (VALTREX) 1000 MG tablet Take 1 tablet (1,000 mg total) by mouth daily. For 5 days at first sign of outbreak 03/11/16  Yes Shawnee Knapp, MD   Social History   Socioeconomic History  . Marital status: Married    Spouse name: Not on file  . Number of children: 2  . Years of education: Not on file  . Highest education level: Not on file  Occupational History  . Not on file  Tobacco Use  . Smoking status: Never Smoker  . Smokeless tobacco: Never Used  Vaping Use  . Vaping Use: Never used  Substance and Sexual Activity  . Alcohol use: Yes    Comment: social/occasional  . Drug use: No  . Sexual activity: Yes    Partners: Male    Birth control/protection: Surgical, Post-menopausal  Other Topics Concern  . Not on file  Social History Narrative  . Not on file   Social Determinants of Health   Financial Resource Strain:   . Difficulty of Paying Living Expenses: Not on file  Food Insecurity:   . Worried About Charity fundraiser in the Last Year: Not on file  . Ran Out of Food in the Last Year: Not on file  Transportation Needs:   . Lack of Transportation (Medical): Not on file  . Lack of Transportation (Non-Medical): Not on file  Physical Activity:   . Days of Exercise per Week: Not on file  . Minutes of Exercise per Session: Not on file  Stress:   . Feeling of Stress : Not on file  Social Connections:     . Frequency of Communication with Friends and Family: Not on file  . Frequency of Social Gatherings with Friends and Family: Not on file  . Attends Religious Services: Not on file  . Active Member of Clubs or Organizations: Not on file  . Attends Archivist Meetings: Not on file  . Marital Status: Not on file  Intimate Partner Violence:   . Fear of Current or Ex-Partner: Not on file  . Emotionally Abused: Not on file  . Physically Abused: Not on file  . Sexually Abused: Not on file    Review of Systems Per HPI.   Objective:   Vitals:   05/10/20 1359  BP: 122/79  Pulse: 80  Temp: 97.9 F (36.6  C)  TempSrc: Temporal  SpO2: 98%  Weight: 119 lb (54 kg)  Height: 5\' 3"  (1.6 m)     Physical Exam Vitals reviewed.  Constitutional:      Appearance: She is well-developed.  HENT:     Head: Normocephalic and atraumatic.  Eyes:     Conjunctiva/sclera: Conjunctivae normal.     Pupils: Pupils are equal, round, and reactive to light.  Neck:     Vascular: No carotid bruit.  Cardiovascular:     Rate and Rhythm: Normal rate and regular rhythm.     Heart sounds: Normal heart sounds.  Pulmonary:     Effort: Pulmonary effort is normal.     Breath sounds: Normal breath sounds.  Abdominal:     General: Abdomen is flat. Bowel sounds are normal. There is no distension.     Palpations: Abdomen is soft. There is no pulsatile mass.     Tenderness: There is no abdominal tenderness.  Skin:    General: Skin is warm and dry.  Neurological:     Mental Status: She is alert and oriented to person, place, and time.  Psychiatric:        Behavior: Behavior normal.       38 minutes spent during visit, greater than 50% counseling and assimilation of information, chart review, and discussion of plan.   Assessment & Plan:  Amy Phillips is a 64 y.o. female . History of diverticulitis - Plan: Ambulatory referral to Gastroenterology  -Recurrent flares versus relapsing remitting flare  previously.  Would like to meet with different gastroenterologist to discuss treatment plan options.  New referral placed.  -Understand her concern with previous inflammation response on allergy shots and concern for that response contributing to diverticulitis but unlikely and has been discussed with her previous gastroenterologist and allergist.  Vaccine counseling Moderate persistent asthma without complication Allergic rhinitis, unspecified seasonality, unspecified trigger  -As above I do understand her concern with possible inflammation response from vaccine and it flaring up her diverticulitis but unlikely for that to occur.  If she did have a flare of diverticulitis, that certainly could be treated but would recommend imaging at that time to verify diverticular cause.   -Did recommend COVID-19 vaccine with her history of allergies and asthma and potential increased risk for complications.  She will consider options but all questions were answered.  No orders of the defined types were placed in this encounter.  Patient Instructions     I do recommend the covid 19 vaccine, especially with allergy and asthma history. If inflammation with that vaccine, antiinflammatory temporarily may be option - can ask Dr. Donneta Romberg his opinion on temporary meds.   I will refer you to Dr. Carlean Purl. If symptoms of diverticulitis flare, be seen right away and imaging at that time would be helpful.   Return to the clinic or go to the nearest emergency room if any of your symptoms worsen or new symptoms occur.   If you have lab work done today you will be contacted with your lab results within the next 2 weeks.  If you have not heard from [place] then please contact us. The fastest way to get your results is to register for My Chart.   IF you received an x-ray today, you will receive an invoice from [facility]. Please contact [facility] at [phone] with questions or concerns regarding your  invoice.   IF you received labwork today, you will receive an invoice from [facility]. Please contact [facility]  at [phone] with questions or concerns regarding your invoice.   Our billing staff will not be able to assist you with questions regarding bills from these companies.  You will be contacted with the lab results as soon as they are available. The fastest way to get your results is to activate your My Chart account. Instructions are located on the last page of this paperwork. If you have not heard from [place] regarding the results in 2 weeks, please contact this office.         Signed, Merri Ray, MD [facility]

## 2020-05-10 NOTE — Patient Instructions (Addendum)
   I do recommend the covid 19 vaccine, especially with allergy and asthma history. If inflammation with that vaccine, antiinflammatory temporarily may be option - can ask Dr. Donneta Romberg his opinion on temporary meds.   I will refer you to Dr. Carlean Purl. If symptoms of diverticulitis flare, be seen right away and imaging at that time would be helpful.   Return to the clinic or go to the nearest emergency room if any of your symptoms worsen or new symptoms occur.   If you have lab work done today you will be contacted with your lab results within the next 2 weeks.  If you have not heard from [place] then please contact us. The fastest way to get your results is to register for My Chart.   IF you received an x-ray today, you will receive an invoice from [facility]. Please contact [facility] at [phone] with questions or concerns regarding your invoice.   IF you received labwork today, you will receive an invoice from [facility]. Please contact [facility] at [phone] with questions or concerns regarding your invoice.   Our billing staff will not be able to assist you with questions regarding bills from these companies.  You will be contacted with the lab results as soon as they are available. The fastest way to get your results is to activate your My Chart account. Instructions are located on the last page of this paperwork. If you have not heard from [place] regarding the results in 2 weeks, please contact this office.

## 2020-05-17 ENCOUNTER — Telehealth: Payer: Self-pay | Admitting: Internal Medicine

## 2020-05-17 NOTE — Telephone Encounter (Signed)
Hey Dr Carlean Purl, this pt is being referred by [facility] for History of diverticulitis to see Dr Carlean Purl,  Pt saw Dr Collene Mares Eugenia Pancoast on 11/29/2019, records are in epic for review. Please advise on scheduling

## 2020-05-18 NOTE — Telephone Encounter (Signed)
I am happy to see her - go ahead and schedule her please

## 2020-05-21 NOTE — Telephone Encounter (Signed)
Left msg on pt's vm to call back 

## 2020-06-18 DIAGNOSIS — Z7689 Persons encountering health services in other specified circumstances: Secondary | ICD-10-CM | POA: Diagnosis not present

## 2020-06-18 DIAGNOSIS — R197 Diarrhea, unspecified: Secondary | ICD-10-CM | POA: Diagnosis not present

## 2020-06-18 DIAGNOSIS — Z8719 Personal history of other diseases of the digestive system: Secondary | ICD-10-CM | POA: Diagnosis not present

## 2020-06-18 DIAGNOSIS — Z0001 Encounter for general adult medical examination with abnormal findings: Secondary | ICD-10-CM | POA: Diagnosis not present

## 2020-06-19 MED FILL — ESOMEPRAZOLE MAG DR 40 MG C: 40 | 90 days supply | Qty: 90 | Fill #2

## 2020-06-19 MED FILL — QVAR REDIHALER 80 MCG/ACT A: 80 | 30 days supply | Qty: 11 | Fill #2

## 2020-06-20 DIAGNOSIS — E559 Vitamin D deficiency, unspecified: Secondary | ICD-10-CM | POA: Diagnosis not present

## 2020-07-31 ENCOUNTER — Encounter: Payer: Self-pay | Admitting: *Deleted

## 2020-09-12 ENCOUNTER — Ambulatory Visit: Payer: 59 | Admitting: Internal Medicine

## 2020-10-11 ENCOUNTER — Other Ambulatory Visit: Payer: Self-pay | Admitting: Family Medicine

## 2020-10-11 DIAGNOSIS — K219 Gastro-esophageal reflux disease without esophagitis: Secondary | ICD-10-CM

## 2020-10-11 DIAGNOSIS — F419 Anxiety disorder, unspecified: Secondary | ICD-10-CM

## 2020-10-11 MED FILL — ESOMEPRAZOLE MAG DR 40 MG C: 40 | 90 days supply | Qty: 90 | Fill #0

## 2020-10-11 MED FILL — FLOVENT HFA 110 MCG INHALER: 110 | 30 days supply | Qty: 12 | Fill #0

## 2020-10-11 NOTE — Telephone Encounter (Signed)
Requested medication (s) are due for refill today - yes  Requested medication (s) are on the active medication list -yes  Future visit scheduled -no  Last refill: 04/17/20  Notes to clinic: Request non delegated Rx  Requested Prescriptions  Pending Prescriptions Disp Refills   ALPRAZolam (XANAX) 0.25 MG tablet [Pharmacy Med Name: ALPRAZolam 0.25 MG TABS 0.25 Tablet] 30 tablet 0    Sig: TAKE 1 TABLET (0.25 MG TOTAL) BY MOUTH AT BEDTIME AS NEEDED FOR ANXIETY OR SLEEP.      Not Delegated - Psychiatry:  Anxiolytics/Hypnotics Failed - 10/11/2020 10:58 AM      Failed - This refill cannot be delegated      Failed - Urine Drug Screen completed in last 360 days      Passed - Valid encounter within last 6 months    Recent Outpatient Visits           5 months ago History of diverticulitis   [facility], Ranell Patrick, MD   1 year ago Gastroesophageal reflux disease without esophagitis   [facility], Ranell Patrick, MD   1 year ago Discomfort of right ear   [facility], Ranell Patrick, MD   1 year ago Asthma with acute exacerbation, unspecified asthma severity, unspecified whether persistent   [facility], Ranell Patrick, MD   1 year ago Asthma with acute exacerbation, unspecified asthma severity, unspecified whether persistent   [facility], Ranell Patrick, MD                 Signed Prescriptions Disp Refills   esomeprazole ([employer]) 40 MG capsule 90 capsule 1    Sig: TAKE 1 CAPSULE (40 MG TOTAL) BY MOUTH DAILY BEFORE A MEAL      Gastroenterology: Proton Pump Inhibitors Passed - 10/11/2020 10:58 AM      Passed - Valid encounter within last 12 months    Recent Outpatient Visits           5 months ago History of diverticulitis   [facility], Ranell Patrick, MD   1 year ago Gastroesophageal reflux disease without esophagitis   [facility], Ranell Patrick, MD   1 year ago Discomfort of  right ear   [facility], Ranell Patrick, MD   1 year ago Asthma with acute exacerbation, unspecified asthma severity, unspecified whether persistent   [facility], Ranell Patrick, MD   1 year ago Asthma with acute exacerbation, unspecified asthma severity, unspecified whether persistent   [facility], Ranell Patrick, MD                    Requested Prescriptions  Pending Prescriptions Disp Refills   ALPRAZolam (XANAX) 0.25 MG tablet [Pharmacy Med Name: ALPRAZolam 0.25 MG TABS 0.25 Tablet] 30 tablet 0    Sig: TAKE 1 TABLET (0.25 MG TOTAL) BY MOUTH AT BEDTIME AS NEEDED FOR ANXIETY OR SLEEP.      Not Delegated - Psychiatry:  Anxiolytics/Hypnotics Failed - 10/11/2020 10:58 AM      Failed - This refill cannot be delegated      Failed - Urine Drug Screen completed in last 360 days      Passed - Valid encounter within last 6 months    Recent Outpatient Visits           5 months ago History of diverticulitis  [facility], Ranell Patrick, MD   1 year ago Gastroesophageal reflux disease without esophagitis   [facility], Ranell Patrick, MD   1 year ago Discomfort of right ear   [facility], Ranell Patrick, MD   1 year ago Asthma with acute exacerbation, unspecified asthma severity, unspecified whether persistent   [facility], Ranell Patrick, MD   1 year ago Asthma with acute exacerbation, unspecified asthma severity, unspecified whether persistent   [facility], Ranell Patrick, MD                 Signed Prescriptions Disp Refills   esomeprazole ([place]) 40 MG capsule 90 capsule 1    Sig: TAKE 1 CAPSULE (40 MG TOTAL) BY MOUTH DAILY BEFORE A MEAL      Gastroenterology: Proton Pump Inhibitors Passed - 10/11/2020 10:58 AM      Passed - Valid encounter within last 12 months    Recent Outpatient Visits           5 months ago History of diverticulitis   [facility], Ranell Patrick, MD   1 year ago Gastroesophageal reflux disease without esophagitis   [facility], Ranell Patrick, MD   1 year ago Discomfort of right ear   [facility], Ranell Patrick, MD   1 year ago Asthma with acute exacerbation, unspecified asthma severity, unspecified whether persistent   [facility], Ranell Patrick, MD   1 year ago Asthma with acute exacerbation, unspecified asthma severity, unspecified whether persistent   [facility], Ranell Patrick, MD

## 2020-10-12 ENCOUNTER — Other Ambulatory Visit: Payer: Self-pay | Admitting: Family Medicine

## 2020-10-12 NOTE — Telephone Encounter (Signed)
Patient is requesting a refill of the following medications: Pending Prescriptions Disp Refills   ALPRAZolam (XANAX) 0.25 MG tablet [Pharmacy Med Name: ALPRAZolam 0.25 MG TABS 0.25 Tablet] 30 tablet 0    Sig: TAKE 1 TABLET (0.25 MG TOTAL) BY MOUTH AT BEDTIME AS NEEDED FOR ANXIETY OR SLEEP.    Date of patient request: 10/11/20 Last office visit: 05/10/20 Date of last refill: 04/17/20 Last refill amount: 30 +0 Follow up time period per chart: None scheduled yet

## 2020-10-12 NOTE — Telephone Encounter (Signed)
Controlled substance database (PDMP) reviewed. No concerns appreciated.  LAST FILLED 04/17/20. Rare use discussed in 09/2019. Refill ordered for now. Can discuss next visit.

## 2020-10-15 MED FILL — ALPRAZolam 0.25 MG TABS: 0.25 | 30 days supply | Qty: 30 | Fill #0

## 2020-11-05 ENCOUNTER — Other Ambulatory Visit: Payer: Self-pay

## 2020-11-05 ENCOUNTER — Other Ambulatory Visit (INDEPENDENT_AMBULATORY_CARE_PROVIDER_SITE_OTHER): Payer: 59

## 2020-11-05 ENCOUNTER — Ambulatory Visit (INDEPENDENT_AMBULATORY_CARE_PROVIDER_SITE_OTHER): Payer: 59 | Admitting: Internal Medicine

## 2020-11-05 ENCOUNTER — Encounter: Payer: Self-pay | Admitting: Internal Medicine

## 2020-11-05 VITALS — BP 124/84 | HR 96 | Ht 63.0 in | Wt 128.6 lb

## 2020-11-05 DIAGNOSIS — K760 Fatty (change of) liver, not elsewhere classified: Secondary | ICD-10-CM | POA: Diagnosis not present

## 2020-11-05 DIAGNOSIS — R1032 Left lower quadrant pain: Secondary | ICD-10-CM

## 2020-11-05 DIAGNOSIS — K5732 Diverticulitis of large intestine without perforation or abscess without bleeding: Secondary | ICD-10-CM

## 2020-11-05 DIAGNOSIS — I728 Aneurysm of other specified arteries: Secondary | ICD-10-CM | POA: Diagnosis not present

## 2020-11-05 DIAGNOSIS — I82890 Acute embolism and thrombosis of other specified veins: Secondary | ICD-10-CM

## 2020-11-05 LAB — BUN: BUN: 19 mg/dL (ref 6–23)

## 2020-11-05 LAB — CREATININE, SERUM: Creatinine, Ser: 0.74 mg/dL (ref 0.40–1.20)

## 2020-11-05 NOTE — Patient Instructions (Signed)
Normal BMI (Body Mass Index- based on height and weight) is between 19 and 25. Your BMI today is Body mass index is 22.78 kg/m. Marland Kitchen Please consider follow up  regarding your BMI with your Primary Care Provider.  Your provider has requested that you go to the basement level for lab work before leaving today. Press "B" on the elevator. The lab is located at the first door on the left as you exit the elevator.   Due to recent changes in healthcare laws, you may see the results of your imaging and laboratory studies on MyChart before your provider has had a chance to review them.  We understand that in some cases there may be results that are confusing or concerning to you. Not all laboratory results come back in the same time frame and the provider may be waiting for multiple results in order to interpret others.  Please give [place] 48 hours in order for your provider to thoroughly review all the results before contacting the office for clarification of your results.   You will be contacted by [facility] in the next 2 days to arrange a CT scan.  The number on your caller ID will be [phone], please answer when they call.  If you have not heard from them in 2 days please call [phone] to schedule.     I appreciate the opportunity to care for you. Silvano Rusk, MD, [facility]

## 2020-11-05 NOTE — Progress Notes (Signed)
Amy Phillips 65 y.o. 1956/03/11 [id number]  Assessment & Plan:   Encounter Diagnoses  Name Primary?  . Diverticulitis of colon-recurrent Yes  . Splenic artery aneurysm ([place])   . Thrombosis of ovarian vein   . Hepatic steatosis   . Abdominal pain, left lower quadrant     CT angio with arterial injection to follow-up the splenic artery aneurysm.  We will also get a look at her liver to follow-up on the steatosis and a look at the colon with respect to the recurrent diverticulitis.  I realize this is not an optimal imaging technique to follow-up diverticulitis but she is not symptomatic right now and I just think some cross-sectional imaging of the area is beneficial so think adding in the pelvis exam makes sense here.  Consider a colonoscopy to reassess this area.  Not convinced it would change management too much but given that it has been since 2013 that she had a colonoscopy and she has had radiographic demonstrated diverticulitis once and other clinical episodes compatible and is having these recurrent issues it is reasonable.  It is interesting that she relates left lower quadrant pain and fever to her allergy shots and that she has not suffered recurrences since stopping them.  A convincing temporal relationship though it does not prove cause-and-effect it makes me wonder.  I do not think I can necessarily correlate that she would have problems with the mRNA Covid vaccine but I understand her reluctance to pursue that given the problems she has had.  I will contact her when the imaging is in and we will determine the next steps.  I do think that the left lower quadrant pain she has sporadically now is likely symptomatic diverticulosis.  Other causes could be her fibroids perhaps some relationship to this ovarian vein thrombosis but that recanalized and she has good blood flow there so seems unlikely to me.  She certainly does not fit the profile for somebody to have significant  steatosis fatty liver issues as her BMI is 22.7.  It is possible to have hepatic steatosis for other reasons she did report a 10 pound weight loss intentionally so maybe this is resolved.  I have reassured her about this.  She can continue her magnesium and I agree with a low residue diet at this time. Orders Placed This Encounter  Procedures  . CT Angio Abd/Pel w/ and/or w/o  . BUN  . Creatinine, serum   I appreciate the opportunity to care for this patient. CC: Amy Agreste, MD Dr. 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 Saw Amy Phillips with history of diverticulitis 05/10/2020 having recurrent left lower quadrant pain unhappy with Dr. 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  She has had problems with diverticulitis confirmed by CT scanning as below.  In early 2020 she was in [place] about the time of Covid outbreak.  She was exposed to some fumes that triggered asthma flares and was treated with prednisone on 3 occasions and then she had ear problems and was treated with prednisone 2 more rounds so she had 5 courses of prednisone and she developed lower abdominal pain was diagnosed with a bladder infection through ED visits  was treated with antibiotics twice and then was found to have diverticulitis on the CT scan of October 2020.  She developed left lower quadrant pain while on Cipro.  She was then treated with Augmentin for what sounds like 2 rounds.  However in the beginning Dr. Collene Mares told her to stop after 5 days but then she became febrile again so she restarted and finished a course release took it for 10 days total and got better.  Subsequently Dr. Donneta Romberg started her  on allergy injections for her allergy problems and within 24 hours of injections on 3 occasions she had severe fatigue temperatures up to 100.6 Fahrenheit and left lower quadrant pain that reminded her of her diverticulitis.  She stopped taking the allergy injections and the symptoms have not recurred since.  She does have some antibiotics at home question doxycycline, to take in case she has diverticulitis again but that has not happened.  She does describe episodic mild left lower quadrant aches or dull pains that will occur but do not last long and are not associated with fever.  She relates that in the early 1980s she took allergy injections and had to stop due to inflammatory arthritis-like symptoms and her knees.  As you can see below from the CT report she had a splenic artery aneurysm and is due her a little overdue for follow-up of that as recommended by radiology.  She notes that steatosis in the liver she is lost some weight she is never really been heavy though she did lose what she calls abdominal fat about 10 pounds since that imaging.  Consider celiac testing as that has been linked to fatty liver  She would like to return to work but with the vaccine restrictions despite having had Covid she does have natural immunity, she is unable to do so.  She is reluctant to take the vaccination because of her concerns about injections with the vaccine acting like the allergy shots did with respect to pain fever etc.  She does say at one point during this time.  She had nausea and vomiting but that was after eating a salad at the [employer]	[place] at [facility].  Some heartburn at times not frequent.  No significant alcohol.  No family history of liver disease  CT abdomen pelvis 07/12/2019 images personally viewed MPRESSION: 1. Non complicated mild sigmoid diverticulitis. 2. Apparent central filling defect within an enlarged left ovarian vein. Given bolus timing, this may simply represent  reflux of contrast into the periphery of the vein. Although there is no Perivenous edema, ovarian vein thrombus cannot be excluded (given diverticulitis immediately adjacent the ovary). If this is a clinical concern, consider contrast enhanced pelvic CT venogram. 3. Possible mild hepatic steatosis. 4. 1.4 cm splenic artery aneurysm.   Electronically Signed   By: Abigail Miyamoto M.D.   On: 07/12/2019 17:49    IMPRESSION: Images personally viewed VASCULAR  1. The suspected ovarian vein abnormality seen on the prior study is persistent and stable with nonocclusive thrombus identified in a short segment of the lower left ovarian vein just above the ovary over a roughly 2.5 cm segment. Similar finding on the right of nonocclusive thrombus slightly higher in the abdomen over a roughly 2 cm segment. This is felt to most likely represent chronic thrombus after prior hysterectomy and there clearly is flow present above the level of thrombus with no evidence of ovarian or pelvic congestion by CT. 2. Incidental detection of a focal partially  calcified splenic artery aneurysm measuring approximately 1.5 x 1.3 x 1.3 cm. At this size, this would be considered of low risk for spontaneous rupture but should be followed. A CTA of the abdomen with arterial contrast opacification for follow-up is recommended in approximately 1 year. Screening for any underlying treatable hypertension also recommended to reduce risk of future aneurysm enlargement/rupture.  NON-VASCULAR  1. Hepatic steatosis. 2. Decreased inflammation of the sigmoid colon with suggestion of mild inflammation remaining involving the distal descending/proximal sigmoid colon. No evidence of extraluminal air or focal abscess.   Electronically Signed   By: Aletta Edouard M.D.   On: 07/22/2019 15:31   Wt Readings from Last 3 Encounters:  11/05/20 128 lb 9.6 oz (58.3 kg)  05/10/20 119 lb (54 kg)  10/07/19 130 lb 9.6 oz  (59.2 kg)   EGD grade 1 esophagitis 2013 Dr. Collene Mares Colonoscopy 2013 diverticulosis  Allergies  Allergen Reactions  . Peanuts [Peanut Oil] Other (See Comments)    Mood changes per pt and husband   . Shellfish Allergy Other (See Comments)    Throat irritation  . Strawberry (Diagnostic)    Current Meds  Medication Sig  . albuterol (VENTOLIN HFA) 108 (90 Base) MCG/ACT inhaler Inhale 2 puffs into the lungs every 6 (six) hours as needed for wheezing or shortness of breath.  . ALPRAZolam (XANAX) 0.25 MG tablet TAKE 1 TABLET (0.25 MG TOTAL) BY MOUTH AT BEDTIME AS NEEDED FOR ANXIETY OR SLEEP.  Marland Kitchen beclomethasone (QVAR REDIHALER) 80 MCG/ACT inhaler Inhale 4 puffs into the lungs 2 (two) times daily.  Marland Kitchen docusate (COLACE) 50 MG/5ML liquid Take by mouth daily.  Marland Kitchen DOXYCYCLINE PO Take 100 mg by mouth as needed.  Marland Kitchen esomeprazole (NEXIUM) 40 MG capsule TAKE 1 CAPSULE (40 MG TOTAL) BY MOUTH DAILY BEFORE A MEAL  . fluticasone (FLONASE) 50 MCG/ACT nasal spray Place 2 sprays into both nostrils as needed for allergies.  Marland Kitchen levocetirizine (XYZAL) 5 MG tablet Take 5 mg by mouth every evening.  . Magnesium 500 MG TABS Take 500 mg by mouth daily.  . valACYclovir (VALTREX) 1000 MG tablet Take 1 tablet (1,000 mg total) by mouth daily. For 5 days at first sign of outbreak   Past Medical History:  Diagnosis Date  . Allergy   . Anxiety   . Arrhythmia    PVCs- no meds  . Asthma   . Basal cell carcinoma of nose 08/19/2017  . COVID   . Diverticulitis 08/05/2012   Dr Collene Mares  . GERD (gastroesophageal reflux disease)   . Ovarian cyst    recurrent  . PVC's (premature ventricular contractions)   . Uterine fibroid 2006   16 week size   Past Surgical History:  Procedure Laterality Date  . ABDOMINAL HYSTERECTOMY N/A 06/12/2015   Procedure: HYSTERECTOMY ABDOMINAL With Vertical Incision;  Surgeon: Eldred Manges, MD;  . BILATERAL SALPINGECTOMY  06/12/2015   Procedure: BILATERAL SALPINGECTOMY;  Surgeon: Eldred Manges, MD;  Location: [facility];  Service: Gynecology;;  . COLONOSCOPY    . DILATATION & CURETTAGE/HYSTEROSCOPY WITH TRUECLEAR N/A 09/08/2013   Procedure: DILATATION & CURETTAGE/HYSTEROSCOPY WITH TRUCLEAR;  Surgeon: Azalia Bilis, MD;  Location: [facility];  Service: Gynecology;  Laterality: N/A;  . DILATION AND CURETTAGE OF UTERUS     D&E x2 missed ab  . ESOPHAGOGASTRODUODENOSCOPY    . 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   Review of Systems Allergy issues some anxious mood she has had some palpitations and insomnia all other review of systems are negative  Objective:   Physical Exam @BP  124/84   Pulse 96   Ht 5\' 3"  (1.6 m)   Wt 128 lb 9.6 oz (58.3 kg)   LMP  (LMP Unknown)   SpO2 98%   BMI 22.78 kg/m @  General:  Well-developed, well-nourished and in no acute distress Eyes:  anicteric. Neck:   supple w/o thyromegaly or mass.  Lungs: Clear to auscultation bilaterally. Heart:  S1S2, no rubs, murmurs, gallops. Abdomen:  soft, mildly tender to deep palpation in the left lower quadrant, no hepatosplenomegaly, hernia, or mass and BS+.  Lymph:  no cervical or supraclavicular adenopathy. Extremities:   no edema, cyanosis or clubbing Neuro:  A&O x 3.  Psych:  appropriate mood and  Affect.   Data Reviewed: See HPI

## 2020-11-15 ENCOUNTER — Ambulatory Visit (HOSPITAL_COMMUNITY)
Admission: RE | Admit: 2020-11-15 | Discharge: 2020-11-15 | Disposition: A | Discharge status: Home / Self Care | Payer: 59 | Source: Ambulatory Visit | Attending: Internal Medicine | Admitting: Internal Medicine

## 2020-11-15 ENCOUNTER — Other Ambulatory Visit: Payer: Self-pay

## 2020-11-15 DIAGNOSIS — K5732 Diverticulitis of large intestine without perforation or abscess without bleeding: Secondary | ICD-10-CM

## 2020-11-15 DIAGNOSIS — I728 Aneurysm of other specified arteries: Secondary | ICD-10-CM | POA: Diagnosis present

## 2020-11-15 MED ORDER — IOHEXOL 350 MG/ML SOLN
100.0000 mL | Freq: Once | INTRAVENOUS | Status: AC | PRN
  Administered 2020-11-15: 100 mL via INTRAVENOUS

## 2020-11-16 ENCOUNTER — Other Ambulatory Visit: Payer: Self-pay | Admitting: Internal Medicine

## 2020-11-16 DIAGNOSIS — K760 Fatty (change of) liver, not elsewhere classified: Secondary | ICD-10-CM

## 2020-11-19 ENCOUNTER — Other Ambulatory Visit (HOSPITAL_COMMUNITY): Payer: Self-pay | Admitting: Dermatology

## 2020-11-19 MED FILL — valACYclovir HCL 1 GM TABS: 1 | 8 days supply | Qty: 30 | Fill #0

## 2020-11-27 ENCOUNTER — Other Ambulatory Visit (INDEPENDENT_AMBULATORY_CARE_PROVIDER_SITE_OTHER): Payer: 59

## 2020-11-27 DIAGNOSIS — K760 Fatty (change of) liver, not elsewhere classified: Secondary | ICD-10-CM

## 2020-11-27 LAB — HEPATIC FUNCTION PANEL
ALT: 21 U/L (ref 0–35)
AST: 14 U/L (ref 0–37)
Albumin: 4.1 g/dL (ref 3.5–5.2)
Alkaline Phosphatase: 44 U/L (ref 39–117)
Bilirubin, Direct: 0 mg/dL (ref 0.0–0.3)
Total Bilirubin: 0.4 mg/dL (ref 0.2–1.2)
Total Protein: 6.4 g/dL (ref 6.0–8.3)

## 2020-11-28 LAB — IGA: Immunoglobulin A: 84 mg/dL (ref 70–320)

## 2020-11-28 LAB — TISSUE TRANSGLUTAMINASE, IGA: (tTG) Ab, IgA: <1 U/mL

## 2020-12-11 ENCOUNTER — Encounter: Payer: Self-pay | Admitting: Internal Medicine

## 2020-12-11 ENCOUNTER — Other Ambulatory Visit: Payer: Self-pay | Admitting: Internal Medicine

## 2020-12-11 ENCOUNTER — Telehealth: Payer: Self-pay | Admitting: Internal Medicine

## 2020-12-11 DIAGNOSIS — K760 Fatty (change of) liver, not elsewhere classified: Secondary | ICD-10-CM

## 2020-12-11 DIAGNOSIS — R932 Abnormal findings on diagnostic imaging of liver and biliary tract: Secondary | ICD-10-CM

## 2020-12-11 NOTE — Telephone Encounter (Signed)
I called her and explained to her that I had spoken to Dr. Earleen Newport of interventional radiology and that based upon her CT showing an abnormal liver texture and question abnormal blood flow in the inferior vena cava we wonder if she could have or be developing portal hypertension.  It is our conclusion that she could benefit from hepatic vein wedge pressure measurement to see if she has portal hypertension and a liver biopsy.  She also gives a history of having possible excessive bleeding with colonoscopy and polypectomy and when she had a biopsy in the pelvis question of the uterus.  She did not have excessive bleeding with surgery i.e. hysterectomy however.  No known bleeding diathesis in 2020 platelets were normal.   Plan:  Schedule interventional radiology consultation with Dr. Earleen Newport regarding abnormal liver and question of portal hypertension to consider hepatic wedge pressure and liver biopsy   I ordered CBC PT and PTT and the patient knows to come for these at her convenience

## 2020-12-12 NOTE — Telephone Encounter (Signed)
Order placed for IR consult.  I left a detailed message for the patient she will be contacted to set up appointment soon at GI.

## 2020-12-14 ENCOUNTER — Other Ambulatory Visit (INDEPENDENT_AMBULATORY_CARE_PROVIDER_SITE_OTHER): Payer: 59

## 2020-12-14 DIAGNOSIS — R932 Abnormal findings on diagnostic imaging of liver and biliary tract: Secondary | ICD-10-CM

## 2020-12-14 LAB — CBC WITH DIFFERENTIAL/PLATELET
Basophils Absolute: 0 10*3/uL (ref 0.0–0.1)
Basophils Relative: 0.9 % (ref 0.0–3.0)
Eosinophils Absolute: 0.1 10*3/uL (ref 0.0–0.7)
Eosinophils Relative: 2 % (ref 0.0–5.0)
HCT: 36.9 % (ref 36.0–46.0)
Hemoglobin: 12.6 g/dL (ref 12.0–15.0)
Lymphocytes Relative: 46 % (ref 12.0–46.0)
Lymphs Abs: 2.4 10*3/uL (ref 0.7–4.0)
MCHC: 34.2 g/dL (ref 30.0–36.0)
MCV: 85.8 fl (ref 78.0–100.0)
Monocytes Absolute: 0.3 10*3/uL (ref 0.1–1.0)
Monocytes Relative: 5.2 % (ref 3.0–12.0)
Neutro Abs: 2.4 10*3/uL (ref 1.4–7.7)
Neutrophils Relative %: 45.9 % (ref 43.0–77.0)
Platelets: 197 10*3/uL (ref 150.0–400.0)
RBC: 4.3 Mil/uL (ref 3.87–5.11)
RDW: 13.8 % (ref 11.5–15.5)
WBC: 5.3 10*3/uL (ref 4.0–10.5)

## 2020-12-14 LAB — APTT: aPTT: 31.5 s (ref 23.4–32.7)

## 2020-12-14 LAB — PROTIME-INR
INR: 1 ratio (ref 0.8–1.0)
Prothrombin Time: 11.2 s (ref 9.6–13.1)

## 2020-12-18 NOTE — Telephone Encounter (Signed)
Patient is scheduled for 3/30 with GI

## 2020-12-19 ENCOUNTER — Other Ambulatory Visit: Payer: Self-pay

## 2020-12-19 ENCOUNTER — Encounter: Payer: Self-pay | Admitting: *Deleted

## 2020-12-19 ENCOUNTER — Ambulatory Visit
Admission: RE | Admit: 2020-12-19 | Discharge: 2020-12-19 | Disposition: A | Discharge status: Home / Self Care | Payer: 59 | Source: Ambulatory Visit | Attending: Internal Medicine | Admitting: Internal Medicine

## 2020-12-19 DIAGNOSIS — R932 Abnormal findings on diagnostic imaging of liver and biliary tract: Secondary | ICD-10-CM

## 2020-12-19 DIAGNOSIS — K760 Fatty (change of) liver, not elsewhere classified: Secondary | ICD-10-CM

## 2020-12-19 HISTORY — PX: IR RADIOLOGIST EVAL & MGMT: IMG5224

## 2020-12-19 NOTE — Consult Note (Signed)
Chief Complaint: Liver Steatosis, Possible Portal HTN  Referring Physician(s): Gessner,Carl E  History of Present Illness: Amy Phillips is a 65 y.o. female presenting as a scheduled consultation to [facility] today, kindly referred by Dr. Carlean Purl, to discuss possible trans-hepatic liver biopsy and portal pressure measurements.   [name] joins [place] today virtually given the COVID situation.  We confirmed her identity with 2 personal identifiers.   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   She has had several CT scans of her abdomen for diagnostics performed related to her history of acute diverticulitis/follow up, and the imaging reveals changes of steatosis.    There are no overt changes of cirrhosis.    On the CT 07/12/2019, during the acute diverticulitis, there was mention of a left ovarian vein enlargement, and possible thrombus.  Upon my own review, I do not see enlargement of the left ovarian vein.  This appears diminutive, with no thrombus.  I think the enlarged vein that was likely interpreted as the ovarian/gonadal is in fact the IMV, which is generous in size.  Non only that, that IMV has an inferior association with a network of small tortuous collaterals, which ultimately drain into the IVC right of midline.  This findings is present on 07/12/19, 07/22/2019, and on CTA 11/15/20.  This arrangement makes me suspect a portal-to-systemic collateral (ectopic veins/varix) which suggest portal HTN, causing retrograde IMV flow.    She denies any history of hematemesis/melana.    She does tell me that her personal experience with biopsy is regarding an endometrial biopsy that resulted in chronic symptomatic uterine bleeding, and hysterectomy was performed.  She is hesitant to undergo more biopsy.      Past Medical History:  Diagnosis Date  . Allergy   . Anxiety   . Arrhythmia    PVCs- no meds  . Asthma   .  Basal cell carcinoma of nose 08/19/2017  . COVID   . Diverticulitis 08/05/2012   Dr Collene Mares  . GERD (gastroesophageal reflux disease)   . Ovarian cyst    recurrent  . PVC's (premature ventricular contractions)   . Uterine fibroid 2006   16 week size    Past Surgical History:  Procedure Laterality Date  . ABDOMINAL HYSTERECTOMY N/A 06/12/2015   Procedure: HYSTERECTOMY ABDOMINAL With Vertical Incision;  Surgeon: Eldred Manges, MD;  . BILATERAL SALPINGECTOMY  06/12/2015   Procedure: BILATERAL SALPINGECTOMY;  Surgeon: Eldred Manges, MD;  Location: [facility];  Service: Gynecology;;  . COLONOSCOPY    . DILATATION & CURETTAGE/HYSTEROSCOPY WITH TRUECLEAR N/A 09/08/2013   Procedure: DILATATION & CURETTAGE/HYSTEROSCOPY WITH TRUCLEAR;  Surgeon: Azalia Bilis, MD;  Location: [facility];  Service: Gynecology;  Laterality: N/A;  . DILATION AND CURETTAGE OF UTERUS     D&E x2 missed ab  . ESOPHAGOGASTRODUODENOSCOPY    . UPPER GI ENDOSCOPY  07/23/12   grade 1 esophagitis noted:  otherwise normal esophagogastrduodenoscopy    Allergies: Peanuts [peanut oil], Shellfish allergy, and Strawberry (diagnostic)  Medications: Prior to Admission medications   Medication Sig Start Date End Date Taking? Authorizing Provider  albuterol (VENTOLIN HFA) 108 (90 Base) MCG/ACT inhaler Inhale 2 puffs into the lungs every 6 (six) hours as needed for wheezing or shortness of breath. 05/27/19   Maximiano Coss, NP  ALPRAZolam Duanne Moron) 0.25 MG tablet TAKE 1 TABLET (0.25 MG TOTAL) BY MOUTH AT BEDTIME  AS NEEDED FOR ANXIETY OR SLEEP. 10/12/20   [name], MD  beclomethasone (QVAR REDIHALER) 80 MCG/ACT inhaler Inhale 4 puffs into the lungs 2 (two) times daily. 01/25/19   [name], MD  cholecalciferol (VITAMIN D3) 25 MCG (1000 UNIT) tablet Take 1,000 Units by mouth daily. Patient not taking: Reported on 11/05/2020    [provider]  docusate (COLACE) 50 MG/5ML liquid Take by mouth daily.    [provider]  DOXYCYCLINE PO Take 100 mg by mouth as needed.    [provider]  esomeprazole (NEXIUM) 40 MG capsule TAKE 1 CAPSULE (40 MG TOTAL) BY MOUTH DAILY BEFORE A MEAL 10/11/20   [name], MD  fluticasone [facility]) 50 MCG/ACT nasal spray Place 2 sprays into both nostrils as needed for allergies. 03/11/16   Shawnee Knapp, MD  levocetirizine (XYZAL) 5 MG tablet Take 5 mg by mouth every evening.    [provider]  Magnesium 500 MG TABS Take 500 mg by mouth daily.    [provider]  valACYclovir (VALTREX) 1000 MG tablet Take 1 tablet (1,000 mg total) by mouth daily. For 5 days at first sign of outbreak 03/11/16   Shawnee Knapp, MD     Family History  Problem Relation Age of Onset  . Arthritis Mother   . Osteoporosis Mother   . Breast cancer Mother   . COPD Mother   . Lung cancer Mother   . COPD Father   . Heart disease Father   . Lung cancer Father   . Hypertension Sister   . Heart disease Sister   . Arthritis Sister   . Hypertension Brother   . Colon cancer Neg Hx   . Esophageal cancer Neg Hx   . Pancreatic cancer Neg Hx   . Liver disease Neg Hx   . Stomach cancer Neg Hx     Social History   Socioeconomic History  . Marital status: Married    Spouse name: Not on file  . Number of children: 2  . Years of education: Not on file  . Highest education level: Not on file  Occupational History  . Not on file  Tobacco Use  . Smoking status: Never Smoker  . Smokeless tobacco: Never Used  Vaping Use  . Vaping Use: Never used  Substance and Sexual Activity  . Alcohol use: Not Currently    Comment: rare  . Drug use: No  . Sexual activity: Yes    Partners: Male    Birth control/protection: Surgical, Post-menopausal  Other Topics Concern  . 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  All other systems are negative.  Physical Exam No direct physical exam was performed (except for noted visual exam findings with Video Visits).    Vital Signs: LMP  (LMP Unknown)   Imaging:  No results found.  Labs:  CBC: Recent Labs    12/14/20 1350  WBC 5.3  HGB 12.6  HCT 36.9  PLT 197.0    COAGS: Recent Labs    12/14/20 1350  INR 1.0  APTT 31.5    BMP: Recent Labs    11/05/20 1118  BUN 19  CREATININE 0.74    LIVER FUNCTION TESTS: Recent Labs    11/27/20 0959  BILITOT 0.4  AST 14  ALT 21  ALKPHOS 44  PROT 6.4  ALBUMIN 4.1    TUMOR MARKERS: No results for input(s): AFPTM, CEA, CA199, CHROMGRNA in the last 8760 hours.  Assessment and Plan:  Ms Shoults is 65 year old female presenting to the [facility] with history of steatosis, and evidence of possible portal HTN on prior CT imaging.   She is with [place] today to discuss possible trans-jugular biopsy and portal pressure measurement.   I had a lengthy discussion with her today regarding the logistics of both percutaneous liver biopsy and trans-jugular biopsy with pressure measurements.  Typically this is performed as outpatient, with moderate sedation, via right IJ access.  The specific risks to the procedure include: bleeding, infection, liver injury, non-diagnostic biopsy, contrast reaction, kidney injury from iodinated contrast, cardiopulmonary collapse, death.    She tells me that she is a bit hesitant to undergo another biopsy after prior experience, however, she understands the need need.  The benefit of performing  the diagnostics would be to gain an understanding of whether she has portal HTN related to her steatosis, which could certain help further management.    We are recommending trans-jugular biopsy with portal pressure measurements.   Plan: - At this time she would like to contemplate the biopsy, and discuss with her family.  She did say that if she decides to proceed, that she would prefer to wait until the month of May.  I think that would be fine, as there is little urgency.  - She will follow up with our office with decision regarding trans-jugular biopsy and pressures.    Thank you for this interesting consult.  I greatly enjoyed meeting CATERINE MCMEANS and look forward to participating in their care.  A copy of this report was sent to the requesting provider on this date.  Electronically Signed: Corrie Mckusick 12/19/2020, 12:07 PM   I spent a total of  40 Minutes   in remote  clinical consultation, greater than 50% of which was counseling/coordinating care for steatosis, possible portal HTN, with possible transjugular liver biopsy and portal pressure measurements. .    Visit type: Audio only (telephone). Audio (no video) only due to patient's lack of internet/smartphone capability. Alternative for in-person consultation at [facility], [address], [place], [place]. This visit type was conducted due to national recommendations for restrictions regarding the COVID-19 Pandemic (e.g. social distancing).  This format is felt to be most appropriate for this patient at this time.  All issues noted in this document were discussed and addressed.

## 2020-12-26 ENCOUNTER — Other Ambulatory Visit (HOSPITAL_COMMUNITY): Payer: Self-pay

## 2020-12-26 MED FILL — Valacyclovir HCl Tab 1 GM: ORAL | 8 days supply | Qty: 30 | Fill #0 | Status: AC

## 2020-12-28 ENCOUNTER — Other Ambulatory Visit (HOSPITAL_COMMUNITY): Payer: Self-pay

## 2021-01-31 ENCOUNTER — Other Ambulatory Visit (HOSPITAL_COMMUNITY): Payer: Self-pay

## 2021-02-01 ENCOUNTER — Other Ambulatory Visit (HOSPITAL_COMMUNITY): Payer: Self-pay

## 2021-02-05 ENCOUNTER — Other Ambulatory Visit (HOSPITAL_COMMUNITY): Payer: Self-pay

## 2021-02-06 ENCOUNTER — Other Ambulatory Visit (HOSPITAL_COMMUNITY): Payer: Self-pay

## 2021-02-06 MED ORDER — LEVOCETIRIZINE DIHYDROCHLORIDE 5 MG PO TABS
5.0000 mg | ORAL_TABLET | Freq: Every evening | ORAL | 0 refills | Status: DC
  Filled 2021-02-06: qty 90, 90d supply, fill #0

## 2021-02-08 ENCOUNTER — Other Ambulatory Visit (HOSPITAL_COMMUNITY): Payer: Self-pay

## 2021-02-12 ENCOUNTER — Other Ambulatory Visit (HOSPITAL_COMMUNITY): Payer: Self-pay

## 2021-02-12 MED ORDER — FLUTICASONE-SALMETEROL 250-50 MCG/ACT IN AEPB
1.0000 | INHALATION_SPRAY | Freq: Two times a day (BID) | RESPIRATORY_TRACT | 3 refills | Status: DC
  Filled 2021-02-12: qty 60, 30d supply, fill #0

## 2021-04-01 IMAGING — DX DG ABDOMEN 1V
1 series · 1 of 1 positions shown · non-contrast
Comparison: Abdominal radiograph 05/18/2014

CLINICAL DATA: Pt c/o LLQ pain x 5 weeks. Denies N/V/D. Hx of
diverticulitis and hysterectomy.

EXAM:
ABDOMEN - 1 VIEW

[abdomen kub]
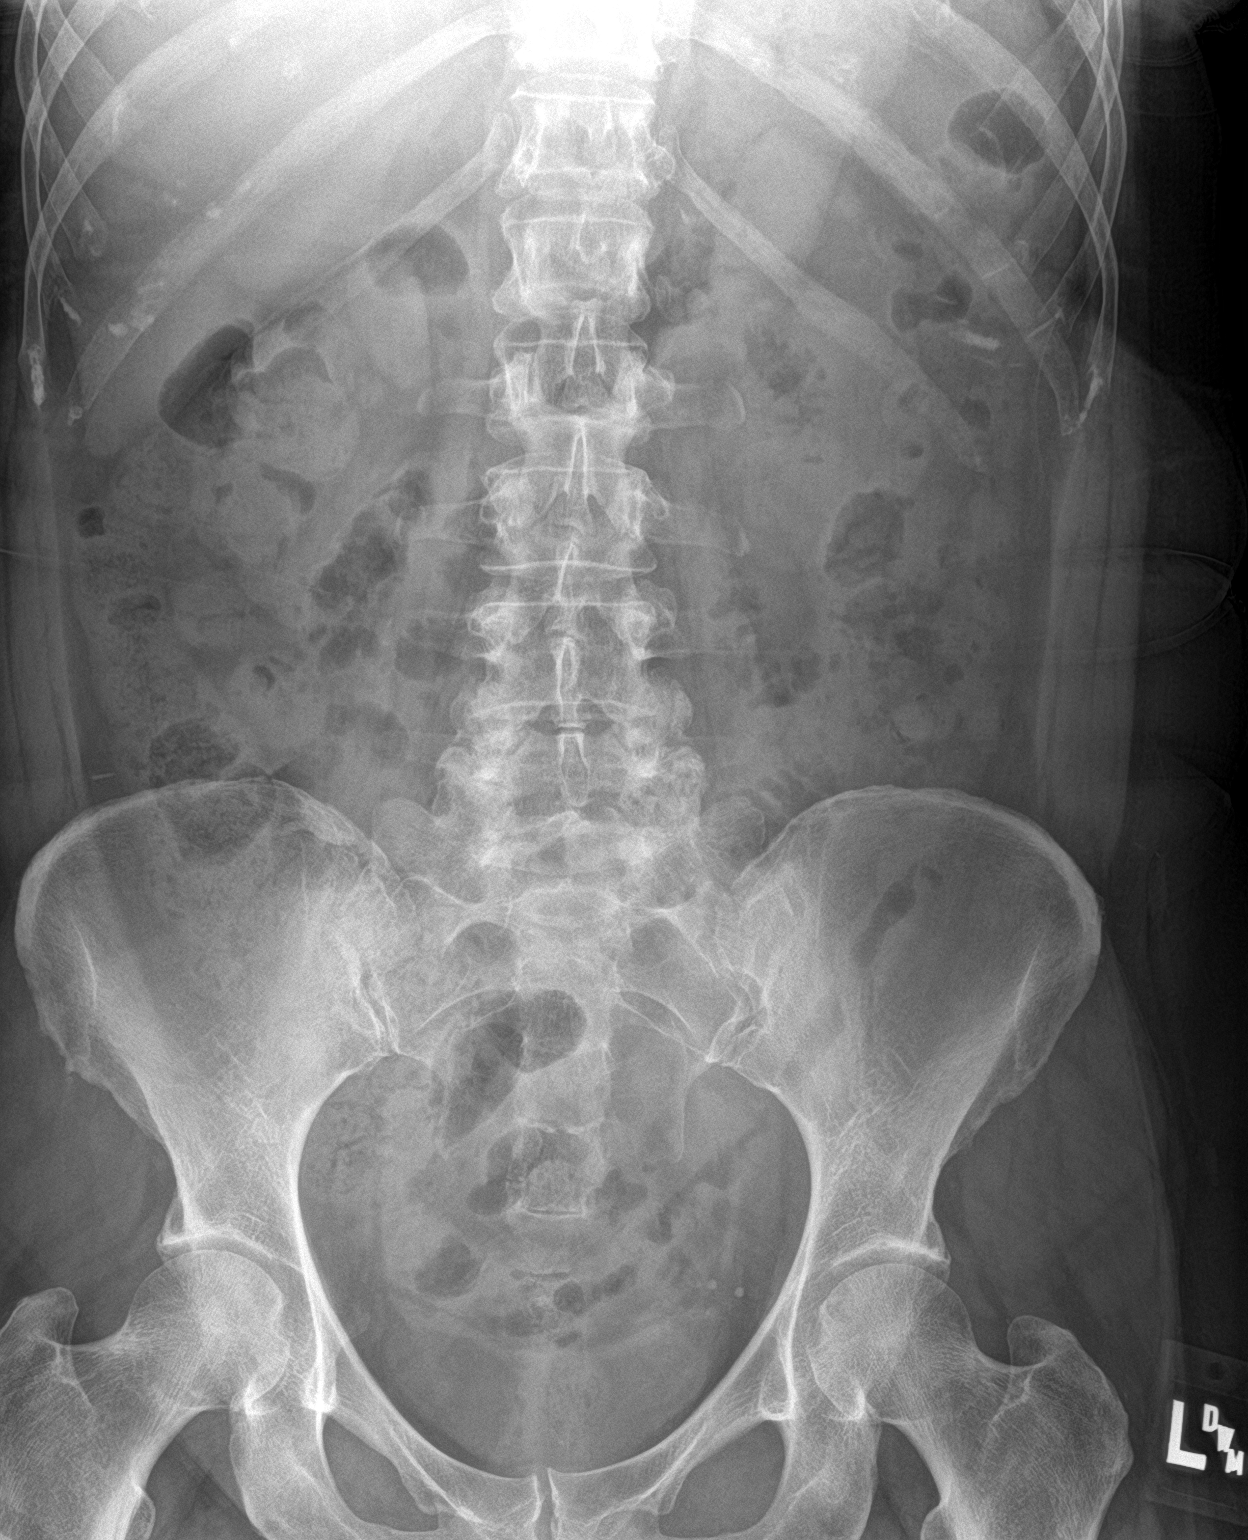

[1 of 1 positions shown; findings below may reference images not displayed]

FINDINGS: There are no dilated loops of bowel. No evidence for free air on
supine view. No unexpected radiopaque foreign body. Lung bases are
excluded from field of view. No acute finding in the visualized
skeleton.
IMPRESSION: Nonobstructive bowel gas pattern.

## 2021-04-04 ENCOUNTER — Other Ambulatory Visit (HOSPITAL_COMMUNITY): Payer: Self-pay

## 2021-04-04 MED ORDER — BUDESONIDE-FORMOTEROL FUMARATE 160-4.5 MCG/ACT IN AERO
2.0000 | INHALATION_SPRAY | Freq: Two times a day (BID) | RESPIRATORY_TRACT | 3 refills | Status: DC
  Filled 2021-04-04 – 2021-04-08 (×2): qty 10.2, 30d supply, fill #0

## 2021-04-05 ENCOUNTER — Other Ambulatory Visit (HOSPITAL_COMMUNITY): Payer: Self-pay

## 2021-04-05 MED ORDER — BUDESONIDE-FORMOTEROL FUMARATE 160-4.5 MCG/ACT IN AERO
2.0000 | INHALATION_SPRAY | Freq: Two times a day (BID) | RESPIRATORY_TRACT | 3 refills | Status: DC
  Filled 2021-04-05: qty 10.2, 30d supply, fill #0

## 2021-04-08 ENCOUNTER — Other Ambulatory Visit (HOSPITAL_COMMUNITY): Payer: Self-pay

## 2021-04-16 ENCOUNTER — Other Ambulatory Visit (HOSPITAL_COMMUNITY): Payer: Self-pay

## 2021-04-20 IMAGING — CT CT VENOGRAM ABD-PELV
2 of 9 series · 12 of 46 positions shown, 15 images · IV contrast (OMNIPAQUE)
Comparison: CT of the abdomen and pelvis on 07/12/2019

CLINICAL DATA: Recent sigmoid colonic diverticulitis with CT
evidence possible thrombus in the left ovarian vein. Status post
prior hysterectomy in 4758. The ovaries were left in place.

EXAM:
CT VENOGRAM ABD-PELVIS
TECHNIQUE: Multidetector CT imaging of the abdomen and pelvis was performed
using the standard protocol during bolus administration of
intravenous contrast. Multiplanar reconstructed images and MIPs were
obtained and reviewed to evaluate the vascular anatomy.
CONTRAST:  150mL OMNIPAQUE IOHEXOL 300 MG/ML  SOLN

[Series 3: coronals · coronal · 0.50mm/px · 2 of 121 slices shown]
[im 41/121  soft-tissue]
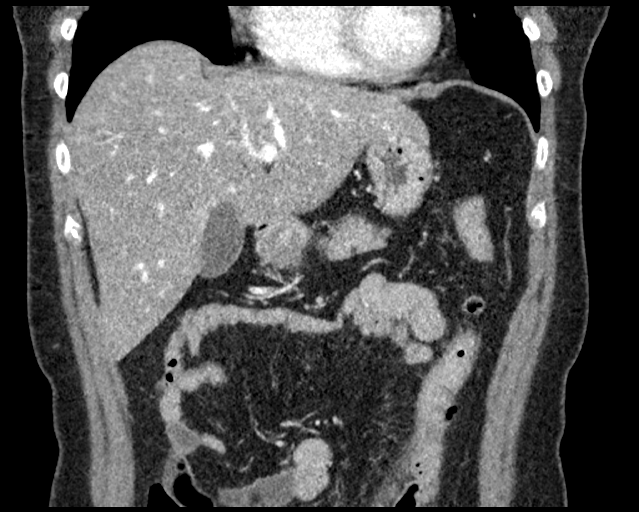
[im 81/121  soft-tissue]
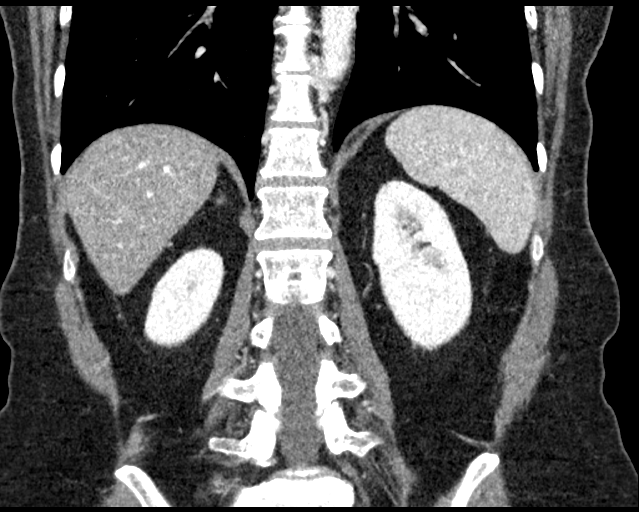

[Series 5: axial venous · axial · portal-venous · 0.69mm/px · z∈[-474,-94]mm · 10 of 90 slices shown, 13 images]
[im 7/90  soft-tissue]
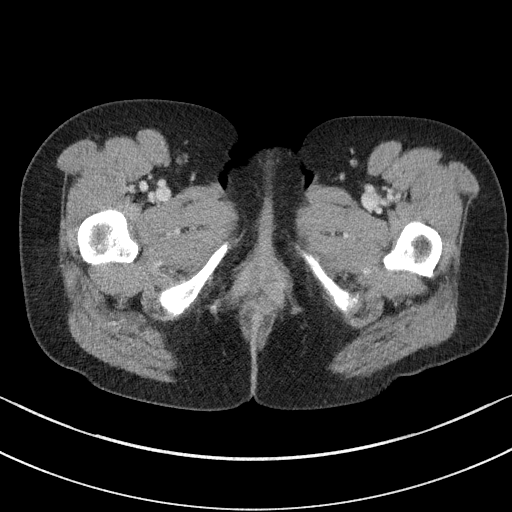
[im 7/90  bone]
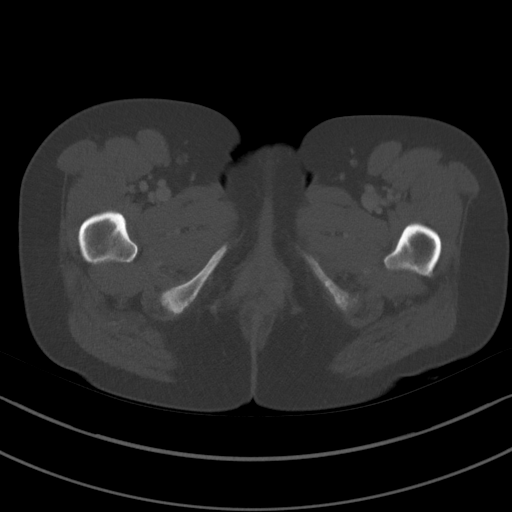
[im 20/90  soft-tissue]
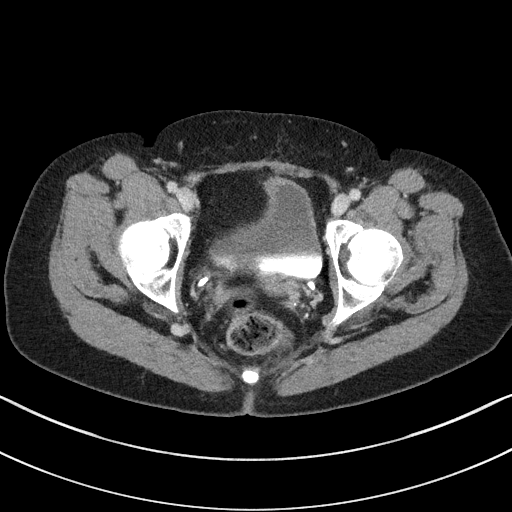
[im 32/90  soft-tissue]
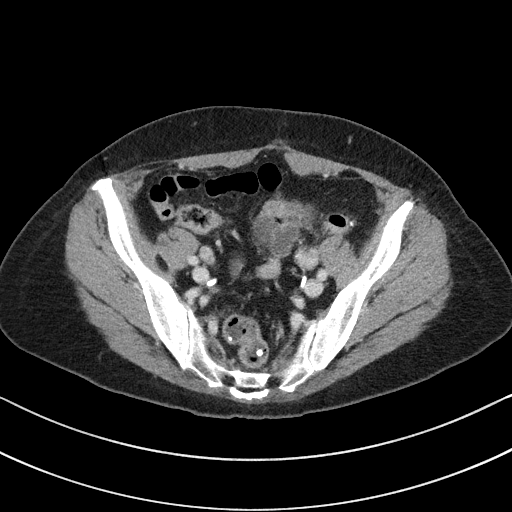
[im 39/90  soft-tissue]
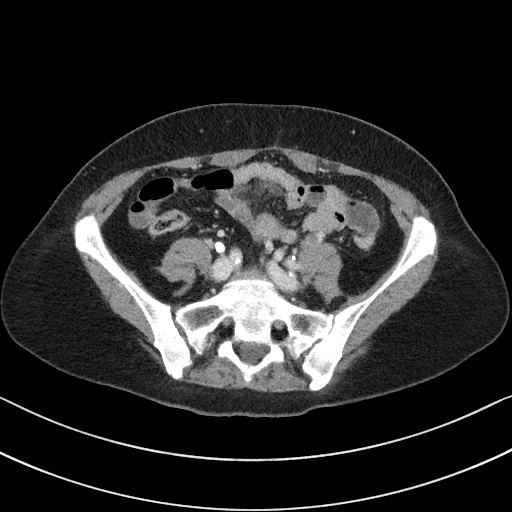
[im 51/90  soft-tissue]
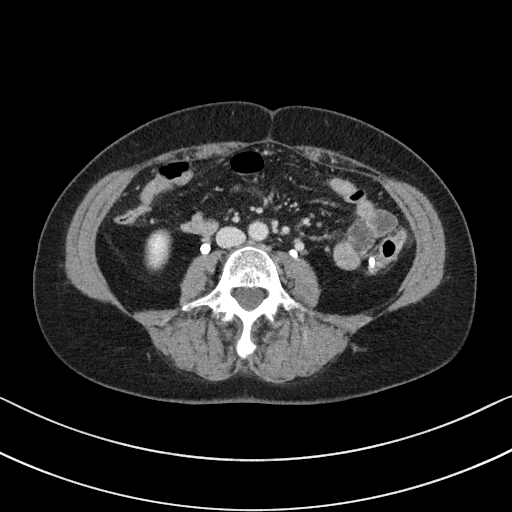
[im 58/90  soft-tissue]
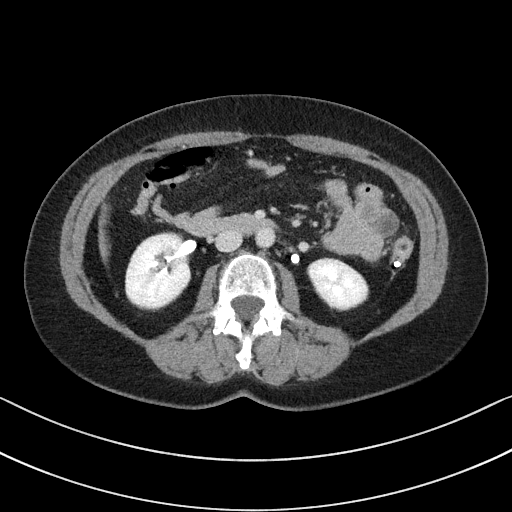
[im 64/90  lung]
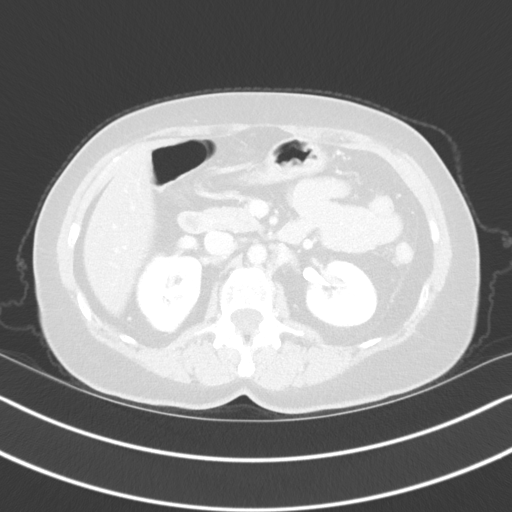
[im 70/90  soft-tissue]
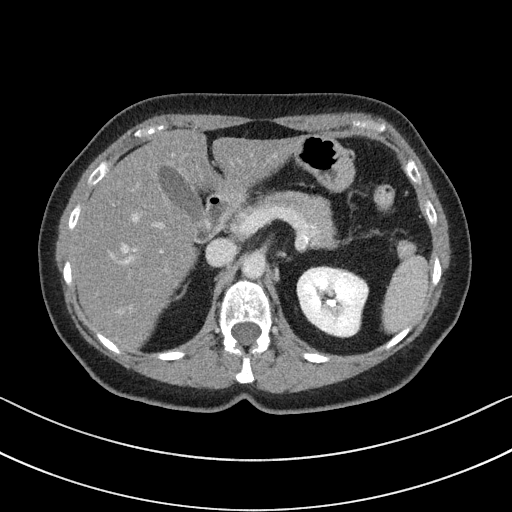
[im 70/90  lung]
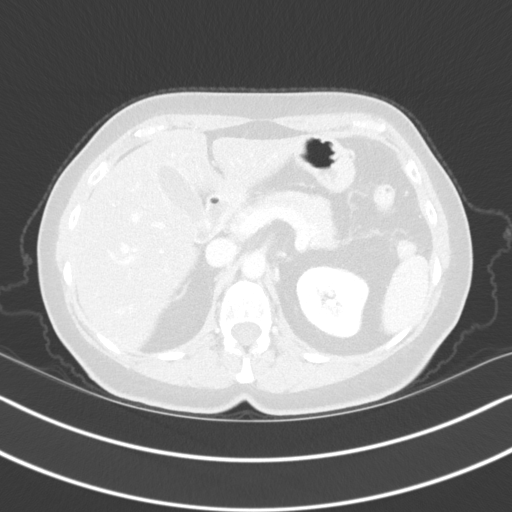
[im 77/90  lung]
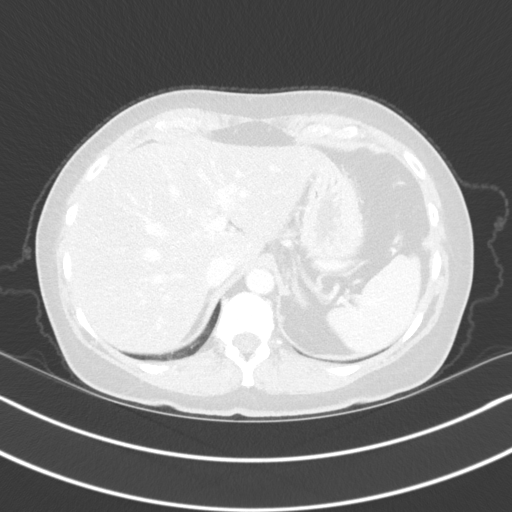
[im 83/90  soft-tissue]
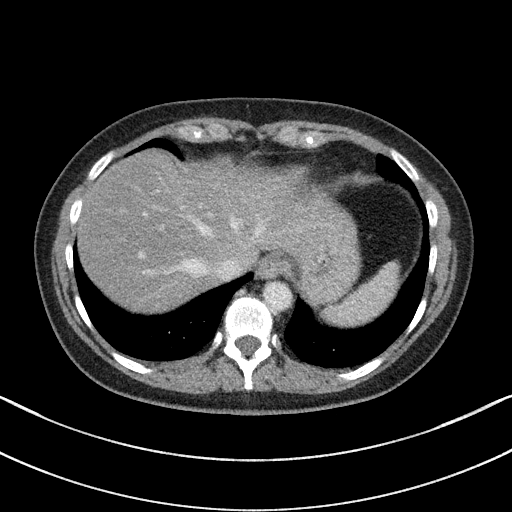
[im 83/90  lung]
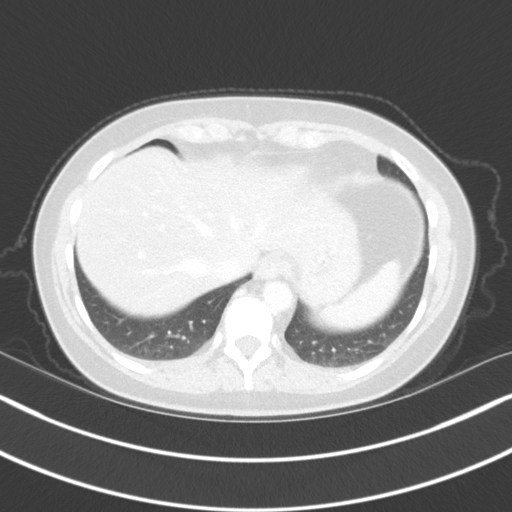

[12 of 46 positions shown; findings below may reference images not displayed]

FINDINGS: VASCULAR

The CTA exam was timed for venous evaluation.

Veins: Stable nonocclusive filling defect in the lower portion of
the left ovarian vein just above the left ovary remains present over
roughly a 2-2.5 cm segment. This appears similar to the prior study.
There is clearly some flow around the filling defect and the vein is
of normal caliber above the nonocclusive thrombus. The left renal
vein and IVC appear normally patent. Similar finding in the right
canal vein located slightly higher in the lower abdomen. The vein
clearly is patent above the level of nonocclusive filling defect and
below the short segment of thrombus which appears to extend over
approximately a 2 cm segment on the right.

Findings are felt to most likely be chronic and likely represent
chronic thrombus after prior hysterectomy. The ovaries are not
enlarged or congested in appearance and there is no evidence of
edema surrounding the ovarian veins or ovaries. No pelvic collateral
veins present.

Other venous evaluation is within normal limits including the common
femoral veins, bilateral iliac veins, IVC, portal vein, mesenteric
veins, splenic vein and renal veins. All of these venous structures
demonstrate normal patency and course. No anatomic variance.

Incidental arterial finding focal aneurysmal dilatation the splenic
artery roughly in its midportion with partially rim calcified
aneurysmal dilatation measuring approximately 1.5 x 1.3 x 1.3 cm.
Distal splenic artery is patent and there is no evidence of thrombus
in the aneurysm or splenic infarct. No evidence aneurysm rupture or
surrounding hemorrhage.

Review of the MIP images confirms the above findings.

NON-VASCULAR

Lower chest: No acute abnormality.

Hepatobiliary: The liver demonstrates diffuse steatosis without
cirrhotic morphology. The gallbladder is unremarkable. No biliary
dilatation.

Pancreas: Unremarkable. No pancreatic ductal dilatation or
surrounding inflammatory changes.

Spleen: Normal in size without focal abnormality.

Adrenals/Urinary Tract: Adrenal glands are unremarkable. Kidneys are
normal, without renal calculi, focal lesion, or hydronephrosis.
Bladder is unremarkable.

Stomach/Bowel: Bowel shows no evidence of obstruction or ileus.
Diverticulosis again noted of the descending and sigmoid colon with
some retained contrast in scattered diverticula. Inflammation
appears improved with potentially some mild inflammation remaining
in the region of the lower descending/proximal sigmoid colon. No
evidence of free air or focal abscess.

Lymphatic: No enlarged lymph nodes identified.

Reproductive: Status post hysterectomy. Bilateral ovaries visible
with no evidence of ovarian masses.

Other: No hernias or abnormal fluid collection.

Musculoskeletal: No acute or significant osseous findings.
IMPRESSION: VASCULAR

1. The suspected ovarian vein abnormality seen on the prior study is
persistent and stable with nonocclusive thrombus identified in a
short segment of the lower left ovarian vein just above the ovary
over a roughly 2.5 cm segment. Similar finding on the right of
nonocclusive thrombus slightly higher in the abdomen over a roughly
2 cm segment. This is felt to most likely represent chronic thrombus
after prior hysterectomy and there clearly is flow present above the
level of thrombus with no evidence of ovarian or pelvic congestion
by CT.
2. Incidental detection of a focal partially calcified splenic
artery aneurysm measuring approximately 1.5 x 1.3 x 1.3 cm. At this
size, this would be considered of low risk for spontaneous rupture
but should be followed. A CTA of the abdomen with arterial contrast
opacification for follow-up is recommended in approximately 1 year.
Screening for any underlying treatable hypertension also recommended
to reduce risk of future aneurysm enlargement/rupture.

NON-VASCULAR

1. Hepatic steatosis.
2. Decreased inflammation of the sigmoid colon with suggestion of
mild inflammation remaining involving the distal descending/proximal
sigmoid colon. No evidence of extraluminal air or focal abscess.

## 2021-06-04 ENCOUNTER — Other Ambulatory Visit (HOSPITAL_COMMUNITY): Payer: Self-pay

## 2021-06-04 ENCOUNTER — Other Ambulatory Visit: Payer: Self-pay | Admitting: Family Medicine

## 2021-06-04 DIAGNOSIS — F419 Anxiety disorder, unspecified: Secondary | ICD-10-CM

## 2021-06-04 MED FILL — Esomeprazole Magnesium Cap Delayed Release 40 MG (Base Eq): ORAL | 90 days supply | Qty: 90 | Fill #0 | Status: AC

## 2021-06-07 ENCOUNTER — Other Ambulatory Visit (HOSPITAL_COMMUNITY): Payer: Self-pay

## 2021-07-04 ENCOUNTER — Other Ambulatory Visit: Payer: Self-pay

## 2021-07-04 ENCOUNTER — Encounter: Payer: Self-pay | Admitting: Family Medicine

## 2021-07-04 ENCOUNTER — Ambulatory Visit (INDEPENDENT_AMBULATORY_CARE_PROVIDER_SITE_OTHER): Payer: 59 | Admitting: Family Medicine

## 2021-07-04 VITALS — BP 122/80 | HR 96 | Temp 98.4°F | Resp 18 | Ht 63.0 in | Wt 127.0 lb

## 2021-07-04 DIAGNOSIS — F4329 Adjustment disorder with other symptoms: Secondary | ICD-10-CM | POA: Diagnosis not present

## 2021-07-04 DIAGNOSIS — Z23 Encounter for immunization: Secondary | ICD-10-CM | POA: Diagnosis not present

## 2021-07-04 DIAGNOSIS — T781XXA Other adverse food reactions, not elsewhere classified, initial encounter: Secondary | ICD-10-CM | POA: Insufficient documentation

## 2021-07-04 DIAGNOSIS — R059 Cough, unspecified: Secondary | ICD-10-CM | POA: Insufficient documentation

## 2021-07-04 DIAGNOSIS — F5104 Psychophysiologic insomnia: Secondary | ICD-10-CM

## 2021-07-04 DIAGNOSIS — J45901 Unspecified asthma with (acute) exacerbation: Secondary | ICD-10-CM | POA: Diagnosis not present

## 2021-07-04 DIAGNOSIS — K219 Gastro-esophageal reflux disease without esophagitis: Secondary | ICD-10-CM

## 2021-07-04 DIAGNOSIS — J309 Allergic rhinitis, unspecified: Secondary | ICD-10-CM | POA: Insufficient documentation

## 2021-07-04 DIAGNOSIS — J301 Allergic rhinitis due to pollen: Secondary | ICD-10-CM | POA: Insufficient documentation

## 2021-07-04 MED ORDER — QVAR REDIHALER 80 MCG/ACT IN AERB: 2.0000 | INHALATION_SPRAY | Freq: Two times a day (BID) | RESPIRATORY_TRACT | 5 refills | Status: AC

## 2021-07-04 MED ORDER — BUPROPION HCL ER (XL) 150 MG PO TB24
150.0000 mg | ORAL_TABLET | Freq: Every day | ORAL | 1 refills | Status: DC
Start: 2021-07-04 — End: 2021-09-25

## 2021-07-04 MED ORDER — ESOMEPRAZOLE MAGNESIUM 40 MG PO CPDR: DELAYED_RELEASE_CAPSULE | ORAL | 1 refills | Status: DC

## 2021-07-04 MED ORDER — QVAR REDIHALER 80 MCG/ACT IN AERB: 4.0000 | INHALATION_SPRAY | Freq: Two times a day (BID) | RESPIRATORY_TRACT | 5 refills | Status: DC

## 2021-07-04 MED ORDER — ALPRAZOLAM 0.25 MG PO TABS: 0.2500 mg | ORAL_TABLET | Freq: Every day | ORAL | 0 refills | Status: DC | PRN

## 2021-07-04 NOTE — Patient Instructions (Addendum)
Ok to start with wellbutrin for now, see other info on stress and sleep below. Alprazolam temporarily for now.  Let me know if you would like some names for therapists, but reasonable to start with Wellbutrin for now.  I refilled the And Qvar.  Keep follow-up with gastroenterology and allergist as planned.  Follow-up in 1 month and we can discuss sleep further at that time and make other medication changes if needed.  Thanks for coming in today.   Insomnia Insomnia is a sleep disorder that makes it difficult to fall asleep or stay asleep. Insomnia can cause fatigue, low energy, difficulty concentrating, mood swings, and poor performance at work or school. There are three different ways to classify insomnia: Difficulty falling asleep. Difficulty staying asleep. Waking up too early in the morning. Any type of insomnia can be long-term (chronic) or short-term (acute). Both are common. Short-term insomnia usually lasts for three months or less. Chronic insomnia occurs at least three times a week for longer than three months. What are the causes? Insomnia may be caused by another condition, situation, or substance, such as: Anxiety. Certain medicines. Gastroesophageal reflux disease (GERD) or other gastrointestinal conditions. Asthma or other breathing conditions. Restless legs syndrome, sleep apnea, or other sleep disorders. Chronic pain. Menopause. Stroke. Abuse of alcohol, tobacco, or illegal drugs. Mental health conditions, such as depression. Caffeine. Neurological disorders, such as Alzheimer's disease. An overactive thyroid (hyperthyroidism). Sometimes, the cause of insomnia may not be known. What increases the risk? Risk factors for insomnia include: Gender. Women are affected more often than men. Age. Insomnia is more common as you get older. Stress. Lack of exercise. Irregular work schedule or working night shifts. Traveling between different time zones. Certain medical and  mental health conditions. What are the signs or symptoms? If you have insomnia, the main symptom is having trouble falling asleep or having trouble staying asleep. This may lead to other symptoms, such as: Feeling fatigued or having low energy. Feeling nervous about going to sleep. Not feeling rested in the morning. Having trouble concentrating. Feeling irritable, anxious, or depressed. How is this diagnosed? This condition may be diagnosed based on: Your symptoms and medical history. Your health care provider may ask about: Your sleep habits. Any medical conditions you have. Your mental health. A physical exam. How is this treated? Treatment for insomnia depends on the cause. Treatment may focus on treating an underlying condition that is causing insomnia. Treatment may also include: Medicines to help you sleep. Counseling or therapy. Lifestyle adjustments to help you sleep better. Follow these instructions at home: Eating and drinking  Limit or avoid alcohol, caffeinated beverages, and cigarettes, especially close to bedtime. These can disrupt your sleep. Do not eat a large meal or eat spicy foods right before bedtime. This can lead to digestive discomfort that can make it hard for you to sleep. Sleep habits  Keep a sleep diary to help you and your health care provider figure out what could be causing your insomnia. Write down: When you sleep. When you wake up during the night. How well you sleep. How rested you feel the next day. Any side effects of medicines you are taking. What you eat and drink. Make your bedroom a dark, comfortable place where it is easy to fall asleep. Put up shades or blackout curtains to block light from outside. Use a white noise machine to block noise. Keep the temperature cool. Limit screen use before bedtime. This includes: Watching TV. Using your smartphone, tablet,  or computer. Stick to a routine that includes going to bed and waking up at the  same times every day and night. This can help you fall asleep faster. Consider making a quiet activity, such as reading, part of your nighttime routine. Try to avoid taking naps during the day so that you sleep better at night. Get out of bed if you are still awake after 15 minutes of trying to sleep. Keep the lights down, but try reading or doing a quiet activity. When you feel sleepy, go back to bed. General instructions Take over-the-counter and prescription medicines only as told by your health care provider. Exercise regularly, as told by your health care provider. Avoid exercise starting several hours before bedtime. Use relaxation techniques to manage stress. Ask your health care provider to suggest some techniques that may work well for you. These may include: Breathing exercises. Routines to release muscle tension. Visualizing peaceful scenes. Make sure that you drive carefully. Avoid driving if you feel very sleepy. Keep all follow-up visits as told by your health care provider. This is important. Contact a health care provider if: You are tired throughout the day. You have trouble in your daily routine due to sleepiness. You continue to have sleep problems, or your sleep problems get worse. Get help right away if: You have serious thoughts about hurting yourself or someone else. If you ever feel like you may hurt yourself or others, or have thoughts about taking your own life, get help right away. You can go to your nearest emergency department or call: Your local emergency services (911 in the [place] A suicide crisis helpline, such as the [place] at 916 607 5043. This is open 24 hours a day. Summary Insomnia is a sleep disorder that makes it difficult to fall asleep or stay asleep. Insomnia can be long-term (chronic) or short-term (acute). Treatment for insomnia depends on the cause. Treatment may focus on treating an underlying condition that is  causing insomnia. Keep a sleep diary to help you and your health care provider figure out what could be causing your insomnia. This information is not intended to replace advice given to you by your health care provider. Make sure you discuss any questions you have with your health care provider. Document Revised: 07/19/2020 Document Reviewed: 07/19/2020 Elsevier Patient Education  2022 [employer] Feeling a certain amount of stress is normal. Stress helps our body and mind get ready to deal with the demands of life. Stress hormones can motivate you to do well at work and meet your responsibilities. However severe or long-lasting (chronic) stress can affect your mental and physical health. Chronic stress puts you at higher risk for anxiety, depression, and other health problems like digestive problems, muscle aches, heart disease, high blood pressure, and stroke. What are the causes? Common causes of stress include: Demands from work, such as deadlines, feeling overworked, or having long hours. Pressures at home, such as money issues, disagreements with a spouse, or parenting issues. Pressures from major life changes, such as divorce, moving, loss of a loved one, or chronic illness. You may be at higher risk for stress-related problems if you do not get enough sleep, are in poor health, do not have emotional support, or have a mental health disorder like anxiety or depression. How to recognize stress Stress can make you: Have trouble sleeping. Feel sad, anxious, irritable, or overwhelmed. Lose your appetite. Overeat or want to eat unhealthy foods. Want to use drugs or  alcohol. Stress can also cause physical symptoms, such as: Sore, tense muscles, especially in the shoulders and neck. Headaches. Trouble breathing. A faster heart rate. Stomach pain, nausea, or vomiting. Diarrhea or constipation. Trouble concentrating. Follow these instructions at  home: Lifestyle Identify the source of your stress and your reaction to it. See a therapist who can help you change your reactions. When there are stressful events: Talk about it with family, friends, or co-workers. Try to think realistically about stressful events and not ignore them or overreact. Try to find the positives in a stressful situation and not focus on the negatives. Cut back on responsibilities at work and home, if possible. Ask for help from friends or family members if you need it. Find ways to cope with stress, such as: Meditation. Deep breathing. Yoga or tai chi. Progressive muscle relaxation. Doing art, playing music, or reading. Making time for fun activities. Spending time with family and friends. Get support from family, friends, or spiritual resources. Eating and drinking Eat a healthy diet. This includes: Eating foods that are high in fiber, such as beans, whole grains, and fresh fruits and vegetables. Limiting foods that are high in fat and processed sugars, such as fried and sweet foods. Do not skip meals or overeat. Drink enough fluid to keep your urine pale yellow. Alcohol use Do not drink alcohol if: Your health care provider tells you not to drink. You are pregnant, may be pregnant, or are planning to become pregnant. Drinking alcohol is a way some people try to ease their stress. This can be dangerous, so if you drink alcohol: Limit how much you use to: 0-1 drink a day for women. 0-2 drinks a day for men. Be aware of how much alcohol is in your drink. In the [place] one drink equals one 12 oz bottle of beer (355 mL), one 5 oz glass of wine (148 mL), or one 1 oz glass of hard liquor (44 mL). Activity  Include 30 minutes of exercise in your daily schedule. Exercise is a good stress reducer. Include time in your day for an activity that you find relaxing. Try taking a walk, going on a bike ride, reading a book, or listening to music. Schedule your time  in a way that lowers stress, and keep a consistent schedule. Prioritize what is most important to get done. General instructions Get enough sleep. Try to go to sleep and get up at about the same time every day. Take over-the-counter and prescription medicines only as told by your health care provider. Do not use any products that contain nicotine or tobacco, such as cigarettes, e-cigarettes, and chewing tobacco. If you need help quitting, ask your health care provider. Do not use drugs or smoke to cope with stress. Keep all follow-up visits as told by your health care provider. This is important. Where to find support Talk with your health care provider about stress management or finding a support group. Find a therapist to work with you on your stress management techniques. Contact a health care provider if: Your stress symptoms get worse. You are unable to manage your stress at home. You are struggling to stop using drugs or alcohol. Get help right away if: You may be a danger to yourself or others. You have any thoughts of death or suicide. If you ever feel like you may hurt yourself or others, or have thoughts about taking your own life, get help right away. You can go to your nearest emergency department or  call: Your local emergency services (911 in the [place] A suicide crisis helpline, such as the [place] at (646)623-0835. This is open 24 hours a day. Summary Feeling a certain amount of stress is normal, but severe or long-lasting (chronic) stress can affect your mental and physical health. Chronic stress can put you at higher risk for anxiety, depression, and other health problems like digestive problems, muscle aches, heart disease, high blood pressure, and stroke. You may be at higher risk for stress-related problems if you do not get enough sleep, are in poor health, lack emotional support, or have a mental health disorder like anxiety or  depression. Identify the source of your stress and your reaction to it. Try talking about stressful events with family, friends, or co-workers, finding a coping method, or getting support from spiritual resources. If you need more help, talk with your health care provider about finding a support group or a mental health therapist. This information is not intended to replace advice given to you by your health care provider. Make sure you discuss any questions you have with your health care provider. Document Revised: 11/16/2020 Document Reviewed: 04/06/2019 Elsevier Patient Education  [place].

## 2021-07-04 NOTE — Progress Notes (Signed)
Subjective:  Patient ID: Amy Phillips, female    DOB: February 23, 1956  Age: 65 y.o. MRN: [id number]  CC:  Chief Complaint  Patient presents with   Follow-up    Med recheck    HPI Amy Phillips presents for   Anxiety: Last discussed last year.  Episodic use of alprazolam every few weeks when discussed at that time.  Rare full pill usually half pill.  Did not tolerate Paxil, Lexapro in the past - nausea.  Controlled substance database (PDMP) reviewed. No concerns appreciated.  Last filled in January, previously in July 2021. Takes only when needed or can't sleep. Less than once per month.  No recent nighttime use. More trouble with sleep recently - past few months. Gets to sleep but then difficulty getting back to sleep - thinking about life. Mother in law at assisted living. Some decline in her health. Daily visitation. Elderly dogs at home, dtr in law with postpartum depression. Son in [place] training for new plane - life stressors.  Denies depression or anxiety, just feels bad about situation in life.  Would like to try wellbutrin.  Some anhedonia.  No snoring.  No paroxysmal nocturnal dyspnea. No hx of OSA.  No alcohol.    Depression screen [facility] 2/9 07/04/2021 10/07/2019 01/05/2019 12/29/2018 12/22/2018  Decreased Interest 2 0 0 0 0  Down, Depressed, Hopeless 2 0 0 0 0  PHQ - 2 Score 4 0 0 0 0  Altered sleeping 3 - - - -  Tired, decreased energy 3 - - - -  Change in appetite 0 - - - -  Feeling bad or failure about yourself  0 - - - -  Trouble concentrating 0 - - - -  Moving slowly or fidgety/restless 0 - - - -  Suicidal thoughts 0 - - - -  PHQ-9 Score 10 - - - -  Difficult doing work/chores Somewhat difficult - - - -   No flowsheet data found.  Moderate persistent asthma with history of multiple allergies Followed by allergist, Dr. Donneta Romberg.  Needs refill on Qvar.  Has albuterol if needed. Adjusted inhalers past summer, did not go well - better back on Qvar. No recent need for  albuterol. Qvar 2 puffs BID.  Next appt end of November.   GERD Followed by gastroenterology, treated with Nexium daily.  Breakthrough symptoms off medicine in the past. Controllig symptoms on meds  Followed by Dr. Carlean Purl- CT angio abdomen in February - question of possible protal HTN - plan for pressure mgt of hepatic veins and liver biopsy. Splenic artery aneurysm stable.  Looking into cost of that procedure. Will ask GI about CPT code - plans to discuss at [place] with Dr. Carlean Purl.    History Patient Active Problem List   Diagnosis Date Noted   Adverse reaction to food 07/04/2021   Allergic rhinitis 07/04/2021   Allergic rhinitis due to pollen 07/04/2021   Cough 07/04/2021   Fibroid, uterine 06/12/2015   Leiomyoma of uterus, unspecified 12/29/2014   Abnormal uterine bleeding 12/29/2014   Enlarged uterus 07/28/2014   Fibroids 05/24/2013   Post-menopausal bleeding 05/24/2013   Diverticulosis 07/27/2012   Colon polyps 07/27/2012   HYPERLIPIDEMIA-MIXED 10/04/2010   Palpitations 10/04/2010   DYSPNEA 10/04/2010   DYSPNEA ON EXERTION 10/04/2010   Past Medical History:  Diagnosis Date   Allergy    Anxiety    Arrhythmia    PVCs- no meds   Asthma    Basal cell carcinoma of nose 08/19/2017  COVID    Diverticulitis 08/05/2012   Dr Collene Mares   GERD (gastroesophageal reflux disease)    Ovarian cyst    recurrent   PVC's (premature ventricular contractions)    Uterine fibroid 2006   16 week size   Past Surgical History:  Procedure Laterality Date   ABDOMINAL HYSTERECTOMY N/A 06/12/2015   Procedure: HYSTERECTOMY ABDOMINAL With Vertical Incision;  Surgeon: Eldred Manges, MD;   BILATERAL SALPINGECTOMY  06/12/2015   Procedure: BILATERAL SALPINGECTOMY;  Surgeon: Eldred Manges, MD;  Location: [facility];  Service: Gynecology;;   COLONOSCOPY     DILATATION & CURETTAGE/HYSTEROSCOPY WITH TRUECLEAR N/A 09/08/2013   Procedure: [place];  Surgeon:  Azalia Bilis, MD;  Location: [facility];  Service: Gynecology;  Laterality: N/A;   DILATION AND CURETTAGE OF UTERUS     D&E x2 missed ab   ESOPHAGOGASTRODUODENOSCOPY     IR RADIOLOGIST EVAL & MGMT  12/19/2020   UPPER GI ENDOSCOPY  07/23/12   grade 1 esophagitis noted:  otherwise normal esophagogastrduodenoscopy   Allergies  Allergen Reactions   Peanuts [Peanut Oil] Other (See Comments)    Mood changes per pt and husband    Shellfish Allergy Other (See Comments)    Throat irritation   Strawberry (Diagnostic)    Prior to Admission medications   Medication Sig Start Date End Date Taking? Authorizing Provider  albuterol (VENTOLIN HFA) 108 (90 Base) MCG/ACT inhaler Inhale 2 puffs into the lungs every 6 (six) hours as needed for wheezing or shortness of breath. 05/27/19  Yes Maximiano Coss, NP  ALPRAZolam Duanne Moron) 0.25 MG tablet Take 0.25 mg by mouth at bedtime as needed for anxiety.   Yes [provider]  beclomethasone (QVAR REDIHALER) 80 MCG/ACT inhaler Inhale 4 puffs into the lungs 2 (two) times daily. 01/25/19  Yes [name], MD  docusate (COLACE) 50 MG/5ML liquid Take by mouth daily.   Yes [provider]  DOXYCYCLINE PO Take 100 mg by mouth as needed.   Yes [provider]  esomeprazole (NEXIUM) 40 MG capsule TAKE 1 CAPSULE (40 MG TOTAL) BY MOUTH DAILY BEFORE A MEAL 10/11/20 10/11/21 Yes [name], MD  fluticasone [facility]) 50 MCG/ACT nasal spray Place 2 sprays into both nostrils as needed for allergies. 03/11/16  Yes Shawnee Knapp, MD  levocetirizine (XYZAL) 5 MG tablet Take 5 mg by mouth every evening.   Yes [provider]  Magnesium 500 MG TABS Take 500 mg by mouth daily.   Yes [provider]  Spacer/Aero-Holding Chambers (AEROCHAMBER PLUS WITH MASK) inhaler USE AS DIRECTED AS NEEDED 10/11/20 10/11/21 Yes Mosetta Anis, MD  valACYclovir (VALTREX) 1000 MG tablet Take 1 tablet (1,000 mg total) by mouth daily. For 5 days at first sign of  outbreak 03/11/16  Yes Shawnee Knapp, MD  budesonide-formoterol [facility]) 160-4.5 MCG/ACT inhaler Inhale 2 puffs into the lungs 2 (two) times daily. Patient not taking: Reported on 07/04/2021 04/04/21     budesonide-formoterol (SYMBICORT) 160-4.5 MCG/ACT inhaler Inhale 2 puffs into the lungs 2 (two) times daily. Patient not taking: Reported on 07/04/2021 04/05/21     cholecalciferol (VITAMIN D3) 25 MCG (1000 UNIT) tablet Take 1,000 Units by mouth daily. Patient not taking: Reported on 11/05/2020    [provider]  fluticasone-salmeterol (ADVAIR) 250-50 MCG/ACT AEPB Inhale 1 puff by mouth twice daily for 30 days Patient not taking: Reported on 07/04/2021 02/12/21   Mosetta Anis, MD  levocetirizine (XYZAL) 5 MG tablet Take 1  tablet (5 mg total) by mouth every evening. Patient not taking: Reported on 07/04/2021 02/06/21     fluticasone (FLOVENT HFA) 110 MCG/ACT inhaler INHALE 2 PUFFS 2 TIMES DAILY 10/11/20 02/12/21  Mosetta Anis, MD   Social History   Socioeconomic History   Marital status: Married    Spouse name: Not on file   Number of children: 2   Years of education: Not on file   Highest education level: Not on file  Occupational History   Not on file  Tobacco Use   Smoking status: Never   Smokeless tobacco: Never  Vaping Use   Vaping Use: Never used  Substance and Sexual Activity   Alcohol use: Not Currently    Comment: rare   Drug use: No   Sexual activity: Yes    Partners: Male    Birth control/protection: Surgical, Post-menopausal  Other Topics Concern   Not on file  Social History Narrative   Married, 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     Objective:   Vitals:   07/04/21 1356  BP: 122/80  Pulse: 96  Resp: 18  Temp: 98.4 F (36.9 C)  TempSrc: Temporal  SpO2: 96%  Weight: 127 lb (57.6 kg)  Height: 5' 3"  (1.6 m)     Physical Exam Vitals reviewed.  Constitutional:      Appearance: Normal appearance. She is well-developed.  HENT:     Head: Normocephalic and atraumatic.  Eyes:     Conjunctiva/sclera: Conjunctivae normal.     Pupils: Pupils are equal, round, and reactive to light.  Neck:     Vascular: No carotid bruit.  Cardiovascular:     Rate and Rhythm: Normal rate and regular rhythm.     Heart sounds: Normal heart sounds.  Pulmonary:     Effort: Pulmonary effort is normal.     Breath sounds: Normal breath sounds.  Abdominal:     Palpations: Abdomen is soft. There is no pulsatile mass.     Tenderness: There is no abdominal tenderness.  Musculoskeletal:     Right lower leg: No edema.     Left lower leg: No edema.  Skin:    General: Skin is warm and dry.  Neurological:     Mental Status: She is alert and oriented to person, place, and time.  Psychiatric:        Mood and Affect: Mood normal.        Behavior: Behavior normal.      47mnutes spent during visit, including chart review, counseling and assimilation of information, review of meds for insomnia. exam, discussion of plan, and chart completion.   Assessment & Plan:  PCHESSICA AUDIAis a 65y.o. female . Psychophysiological insomnia - Plan: ALPRAZolam (XANAX) 0.25 MG tablet, buPROPion (WELLBUTRIN XL) 150 MG 24 hr tablet  -New with likely adjustment disorder component versus underlying depression/anxiety.  She would like to try Wellbutrin, start 150 mg daily.  Option to increase to 300 mg  after first week but can stay at 150 mg if doing well until follow-up visit.  Continue low-dose alprazolam if needed, sleep hygiene  discussed with handout given as well as stress management handout.  Gastroesophageal reflux disease without esophagitis - Plan: esomeprazole (NEXIUM) 40 MG capsule  -Stable with Nexium, continue same, follow-up with gastroenterology including discussion of plan procedure, and potential CPT codes as she is trying to gather information regarding cost.  Asthma with acute exacerbation, unspecified asthma severity, unspecified whether persistent - Plan: beclomethasone (QVAR REDIHALER) 80 MCG/ACT inhaler  -Now stable back on Qvar, refilled same dose.  Continue ongoing care with allergist  Adjustment disorder with other symptom - Plan: ALPRAZolam (XANAX) 0.25 MG tablet, buPROPion (WELLBUTRIN XL) 150 MG 24 hr tablet  -As above  Flu vaccine need - Plan: Flu Vaccine QUAD 6+ mos PF IM (Fluarix Quad PF)   Meds ordered this encounter  Medications   esomeprazole (NEXIUM) 40 MG capsule    Sig: TAKE 1 CAPSULE (40 MG TOTAL) BY MOUTH DAILY BEFORE A MEAL    Dispense:  90 capsule    Refill:  1   beclomethasone (QVAR REDIHALER) 80 MCG/ACT inhaler    Sig: Inhale 4 puffs into the lungs 2 (two) times daily.    Dispense:  1 each    Refill:  5    Pt does not need currently. Please add this on as refills to current rx.   ALPRAZolam (XANAX) 0.25 MG tablet    Sig: Take 1 tablet (0.25 mg total) by mouth daily as needed for anxiety or sleep.    Dispense:  30 tablet    Refill:  0   buPROPion (WELLBUTRIN XL) 150 MG 24 hr tablet    Sig: Take 1 tablet (150 mg total) by mouth daily.    Dispense:  30 tablet    Refill:  1   Patient Instructions   Ok to start with wellbutrin for now, see other info on stress and sleep below. Alprazolam temporarily for now.  Let me know if you would like some names for therapists, but reasonable to start with Wellbutrin for now.  I refilled the And Qvar.  Keep follow-up with gastroenterology and allergist as planned.  Follow-up in 1 month and we can discuss sleep further at that time  and make other medication changes if needed.  Thanks for coming in today.   Insomnia Insomnia is a sleep disorder that makes it difficult to fall asleep or stay asleep. Insomnia can cause fatigue, low energy, difficulty concentrating, mood swings, and poor performance at work or school. There are three different ways to classify insomnia: Difficulty falling asleep. Difficulty staying asleep. Waking up too early in the morning. Any type of insomnia can be long-term (chronic) or short-term (acute). Both are common. Short-term insomnia usually lasts for three months or less. Chronic insomnia occurs at least three times a week for longer than three months. What are the causes? Insomnia may be caused by another condition, situation, or substance, such as: Anxiety. Certain medicines. Gastroesophageal reflux disease (GERD) or other gastrointestinal conditions. Asthma or other breathing conditions. Restless legs syndrome, sleep apnea, or other sleep disorders. Chronic pain. Menopause. Stroke. Abuse of alcohol, tobacco, or illegal drugs. Mental health conditions, such as depression. Caffeine. Neurological disorders, such as Alzheimer's disease. An overactive thyroid (hyperthyroidism). Sometimes, the cause of insomnia may not be known. What increases the risk? Risk factors for insomnia include: Gender. Women are affected more often than men. Age. Insomnia is more common  as you get older. Stress. Lack of exercise. Irregular work schedule or working night shifts. Traveling between different time zones. Certain medical and mental health conditions. What are the signs or symptoms? If you have insomnia, the main symptom is having trouble falling asleep or having trouble staying asleep. This may lead to other symptoms, such as: Feeling fatigued or having low energy. Feeling nervous about going to sleep. Not feeling rested in the morning. Having trouble concentrating. Feeling irritable,  anxious, or depressed. How is this diagnosed? This condition may be diagnosed based on: Your symptoms and medical history. Your health care provider may ask about: Your sleep habits. Any medical conditions you have. Your mental health. A physical exam. How is this treated? Treatment for insomnia depends on the cause. Treatment may focus on treating an underlying condition that is causing insomnia. Treatment may also include: Medicines to help you sleep. Counseling or therapy. Lifestyle adjustments to help you sleep better. Follow these instructions at home: Eating and drinking  Limit or avoid alcohol, caffeinated beverages, and cigarettes, especially close to bedtime. These can disrupt your sleep. Do not eat a large meal or eat spicy foods right before bedtime. This can lead to digestive discomfort that can make it hard for you to sleep. Sleep habits  Keep a sleep diary to help you and your health care provider figure out what could be causing your insomnia. Write down: When you sleep. When you wake up during the night. How well you sleep. How rested you feel the next day. Any side effects of medicines you are taking. What you eat and drink. Make your bedroom a dark, comfortable place where it is easy to fall asleep. Put up shades or blackout curtains to block light from outside. Use a white noise machine to block noise. Keep the temperature cool. Limit screen use before bedtime. This includes: Watching TV. Using your smartphone, tablet, or computer. Stick to a routine that includes going to bed and waking up at the same times every day and night. This can help you fall asleep faster. Consider making a quiet activity, such as reading, part of your nighttime routine. Try to avoid taking naps during the day so that you sleep better at night. Get out of bed if you are still awake after 15 minutes of trying to sleep. Keep the lights down, but try reading or doing a quiet activity.  When you feel sleepy, go back to bed. General instructions Take over-the-counter and prescription medicines only as told by your health care provider. Exercise regularly, as told by your health care provider. Avoid exercise starting several hours before bedtime. Use relaxation techniques to manage stress. Ask your health care provider to suggest some techniques that may work well for you. These may include: Breathing exercises. Routines to release muscle tension. Visualizing peaceful scenes. Make sure that you drive carefully. Avoid driving if you feel very sleepy. Keep all follow-up visits as told by your health care provider. This is important. Contact a health care provider if: You are tired throughout the day. You have trouble in your daily routine due to sleepiness. You continue to have sleep problems, or your sleep problems get worse. Get help right away if: You have serious thoughts about hurting yourself or someone else. If you ever feel like you may hurt yourself or others, or have thoughts about taking your own life, get help right away. You can go to your nearest emergency department or call: Your local emergency services (911 in  the [place] A suicide crisis helpline, such as the [place] at 802-130-4191. This is open 24 hours a day. Summary Insomnia is a sleep disorder that makes it difficult to fall asleep or stay asleep. Insomnia can be long-term (chronic) or short-term (acute). Treatment for insomnia depends on the cause. Treatment may focus on treating an underlying condition that is causing insomnia. Keep a sleep diary to help you and your health care provider figure out what could be causing your insomnia. This information is not intended to replace advice given to you by your health care provider. Make sure you discuss any questions you have with your health care provider. Document Revised: 07/19/2020 Document Reviewed: 07/19/2020 Elsevier  Patient Education  2022 [place], Adult Feeling a certain amount of stress is normal. Stress helps our body and mind get ready to deal with the demands of life. Stress hormones can motivate you to do well at work and meet your responsibilities. However severe or long-lasting (chronic) stress can affect your mental and physical health. Chronic stress puts you at higher risk for anxiety, depression, and other health problems like digestive problems, muscle aches, heart disease, high blood pressure, and stroke. What are the causes? Common causes of stress include: Demands from work, such as deadlines, feeling overworked, or having long hours. Pressures at home, such as money issues, disagreements with a spouse, or parenting issues. Pressures from major life changes, such as divorce, moving, loss of a loved one, or chronic illness. You may be at higher risk for stress-related problems if you do not get enough sleep, are in poor health, do not have emotional support, or have a mental health disorder like anxiety or depression. How to recognize stress Stress can make you: Have trouble sleeping. Feel sad, anxious, irritable, or overwhelmed. Lose your appetite. Overeat or want to eat unhealthy foods. Want to use drugs or alcohol. Stress can also cause physical symptoms, such as: Sore, tense muscles, especially in the shoulders and neck. Headaches. Trouble breathing. A faster heart rate. Stomach pain, nausea, or vomiting. Diarrhea or constipation. Trouble concentrating. Follow these instructions at home: Lifestyle Identify the source of your stress and your reaction to it. See a therapist who can help you change your reactions. When there are stressful events: Talk about it with family, friends, or co-workers. Try to think realistically about stressful events and not ignore them or overreact. Try to find the positives in a stressful situation and not focus on the  negatives. Cut back on responsibilities at work and home, if possible. Ask for help from friends or family members if you need it. Find ways to cope with stress, such as: Meditation. Deep breathing. Yoga or tai chi. Progressive muscle relaxation. Doing art, playing music, or reading. Making time for fun activities. Spending time with family and friends. Get support from family, friends, or spiritual resources. Eating and drinking Eat a healthy diet. This includes: Eating foods that are high in fiber, such as beans, whole grains, and fresh fruits and vegetables. Limiting foods that are high in fat and processed sugars, such as fried and sweet foods. Do not skip meals or overeat. Drink enough fluid to keep your urine pale yellow. Alcohol use Do not drink alcohol if: Your health care provider tells you not to drink. You are pregnant, may be pregnant, or are planning to become pregnant. Drinking alcohol is a way some people try to ease their stress. This can be dangerous, so if you drink  alcohol: Limit how much you use to: 0-1 drink a day for women. 0-2 drinks a day for men. Be aware of how much alcohol is in your drink. In the [place] one drink equals one 12 oz bottle of beer (355 mL), one 5 oz glass of wine (148 mL), or one 1 oz glass of hard liquor (44 mL). Activity  Include 30 minutes of exercise in your daily schedule. Exercise is a good stress reducer. Include time in your day for an activity that you find relaxing. Try taking a walk, going on a bike ride, reading a book, or listening to music. Schedule your time in a way that lowers stress, and keep a consistent schedule. Prioritize what is most important to get done. General instructions Get enough sleep. Try to go to sleep and get up at about the same time every day. Take over-the-counter and prescription medicines only as told by your health care provider. Do not use any products that contain nicotine or tobacco, such as  cigarettes, e-cigarettes, and chewing tobacco. If you need help quitting, ask your health care provider. Do not use drugs or smoke to cope with stress. Keep all follow-up visits as told by your health care provider. This is important. Where to find support Talk with your health care provider about stress management or finding a support group. Find a therapist to work with you on your stress management techniques. Contact a health care provider if: Your stress symptoms get worse. You are unable to manage your stress at home. You are struggling to stop using drugs or alcohol. Get help right away if: You may be a danger to yourself or others. You have any thoughts of death or suicide. If you ever feel like you may hurt yourself or others, or have thoughts about taking your own life, get help right away. You can go to your nearest emergency department or call: Your local emergency services (911 in the [place] A suicide crisis helpline, such as the [place] at (614) 667-2546. This is open 24 hours a day. Summary Feeling a certain amount of stress is normal, but severe or long-lasting (chronic) stress can affect your mental and physical health. Chronic stress can put you at higher risk for anxiety, depression, and other health problems like digestive problems, muscle aches, heart disease, high blood pressure, and stroke. You may be at higher risk for stress-related problems if you do not get enough sleep, are in poor health, lack emotional support, or have a mental health disorder like anxiety or depression. Identify the source of your stress and your reaction to it. Try talking about stressful events with family, friends, or co-workers, finding a coping method, or getting support from spiritual resources. If you need more help, talk with your health care provider about finding a support group or a mental health therapist. This information is not intended to replace advice  given to you by your health care provider. Make sure you discuss any questions you have with your health care provider. Document Revised: 11/16/2020 Document Reviewed: 04/06/2019 Elsevier Patient Education  2022 [place],   Merri Ray, MD [place], [place] [facility] 07/04/21 6:22 PM

## 2021-07-08 ENCOUNTER — Encounter: Payer: Self-pay | Admitting: Family Medicine

## 2021-07-08 LAB — HM DIABETES EYE EXAM

## 2021-07-10 ENCOUNTER — Encounter: Payer: Self-pay | Admitting: Family Medicine

## 2021-07-19 NOTE — Telephone Encounter (Signed)
US ABDOMEN COMPLETE W/ELASTOGRAPHY   Linked chargeables: [facility] ULTRASOUND ELASTOGRAPHY PARENCHYMA [44584 (CPT)]    The Liver Doppler is 93975.  That's the only two codes I know for what Dr. Carlean Purl is talking about in his note.  If you need more info, call the IR dept at the hospital.

## 2021-07-31 NOTE — Telephone Encounter (Signed)
I do not have a price list on anything.  The codes I have provided are the only ones I have.  If there is a procedure that is booked at the hospital, the preservice center calls the patients prior to with their benefits and cost of any procedures.

## 2021-08-07 ENCOUNTER — Ambulatory Visit: Payer: 59 | Admitting: Family Medicine

## 2021-09-25 ENCOUNTER — Ambulatory Visit (INDEPENDENT_AMBULATORY_CARE_PROVIDER_SITE_OTHER): Payer: 59 | Admitting: Family Medicine

## 2021-09-25 ENCOUNTER — Encounter: Payer: Self-pay | Admitting: Family Medicine

## 2021-09-25 DIAGNOSIS — F5104 Psychophysiologic insomnia: Secondary | ICD-10-CM

## 2021-09-25 DIAGNOSIS — F4329 Adjustment disorder with other symptoms: Secondary | ICD-10-CM | POA: Diagnosis not present

## 2021-09-25 MED ORDER — BUPROPION HCL ER (XL) 150 MG PO TB24: 150.0000 mg | ORAL_TABLET | Freq: Every day | ORAL | 1 refills | Status: DC

## 2021-09-25 NOTE — Patient Instructions (Addendum)
I am glad to hear that things are better. Continue Wellbutrin same dose for now and follow up in 3-42months.

## 2021-09-25 NOTE — Progress Notes (Signed)
Subjective:  Patient ID: Amy Phillips, female    DOB: 07-Nov-1955  Age: 66 y.o. MRN: [id number]  CC:  Chief Complaint  Patient presents with   Insomnia    Pt reports some waking in middle of the night has been mostly manageable    Stress    Pt reports elderly dog has passed on and this has relieved some of her stress, she has been more mindful and sets a schedule to ensure she is not overwhelmed     HPI Amy Phillips presents for  Insomnia/stress Discussed 3 months ago.  Episodic use of alprazolam previously, typically half pill..  Did not tolerate Paxil or Lexapro previously.  Had been having some increased difficulty with sleep for the prior few months at her October visit.  Family stressors with her mom in assisted living, decline in her health, elderly dogs at home and daughter-in-law with postpartum depression, son in [employer] training. Started on Wellbutrin for possible underlying depression/anxiety with adjustment disorder.  Initially 150 mg daily with option of 300 mg daily.  Continue alprazolam daily dose if needed. Overall improved.  Unfortunately her dog passed away 4-5 weeks ago,  but that has been a relief of some of her stress.  More motivation - feels better on 150mg  dose wellbutrin. Has been more mindful, staying the schedule, helping to minimize overwhelmed feelings.  Insomnia still present at times but more manageable.  Nighttime wakening only occasionally.  Alprazolam - 3 times over holidays - 1/2 dose.   Depression screen [facility] 2/9 07/04/2021 10/07/2019 01/05/2019 12/29/2018 12/22/2018  Decreased Interest 2 0 0 0 0  Down, Depressed, Hopeless 2 0 0 0 0  PHQ - 2 Score 4 0 0 0 0  Altered sleeping 3 - - - -  Tired, decreased energy 3 - - - -  Change in appetite 0 - - - -  Feeling bad or failure about yourself  0 - - - -  Trouble concentrating 0 - - - -  Moving slowly or fidgety/restless 0 - - - -  Suicidal thoughts 0 - - - -  PHQ-9 Score 10 - - - -  Difficult doing work/chores  Somewhat difficult - - - -   No flowsheet data found.     History Patient Active Problem List   Diagnosis Date Noted   Adverse reaction to food 07/04/2021   Allergic rhinitis 07/04/2021   Allergic rhinitis due to pollen 07/04/2021   Cough 07/04/2021   Fibroid, uterine 06/12/2015   Leiomyoma of uterus, unspecified 12/29/2014   Abnormal uterine bleeding 12/29/2014   Enlarged uterus 07/28/2014   Fibroids 05/24/2013   Post-menopausal bleeding 05/24/2013   Diverticulosis 07/27/2012   Colon polyps 07/27/2012   HYPERLIPIDEMIA-MIXED 10/04/2010   Palpitations 10/04/2010   DYSPNEA 10/04/2010   DYSPNEA ON EXERTION 10/04/2010   Past Medical History:  Diagnosis Date   Allergy    Anxiety    Arrhythmia    PVCs- no meds   Asthma    Basal cell carcinoma of nose 08/19/2017   COVID    Diverticulitis 08/05/2012   Dr Collene Mares   GERD (gastroesophageal reflux disease)    Ovarian cyst    recurrent   PVC's (premature ventricular contractions)    Uterine fibroid 2006   16 week size   Past Surgical History:  Procedure Laterality Date   ABDOMINAL HYSTERECTOMY N/A 06/12/2015   Procedure: HYSTERECTOMY ABDOMINAL With Vertical Incision;  Surgeon: Eldred Manges, MD;   BILATERAL SALPINGECTOMY  06/12/2015  Procedure: BILATERAL SALPINGECTOMY;  Surgeon: Eldred Manges, MD;  Location: [facility];  Service: Gynecology;;   COLONOSCOPY     DILATATION & CURETTAGE/HYSTEROSCOPY WITH TRUECLEAR N/A 09/08/2013   Procedure: DILATATION & CURETTAGE/HYSTEROSCOPY WITH TRUCLEAR;  Surgeon: Azalia Bilis, MD;  Location: [facility];  Service: Gynecology;  Laterality: N/A;   DILATION AND CURETTAGE OF UTERUS     D&E x2 missed ab   ESOPHAGOGASTRODUODENOSCOPY     IR RADIOLOGIST EVAL & MGMT  12/19/2020   UPPER GI ENDOSCOPY  07/23/12   grade 1 esophagitis noted:  otherwise normal esophagogastrduodenoscopy   Allergies  Allergen Reactions   Peanuts [Peanut Oil] Other (See Comments)    Mood changes per pt and husband     Shellfish Allergy Other (See Comments)    Throat irritation   Strawberry (Diagnostic)    Prior to Admission medications   Medication Sig Start Date End Date Taking? Authorizing Provider  albuterol (VENTOLIN HFA) 108 (90 Base) MCG/ACT inhaler Inhale 2 puffs into the lungs every 6 (six) hours as needed for wheezing or shortness of breath. 05/27/19  Yes Maximiano Coss, NP  ALPRAZolam Duanne Moron) 0.25 MG tablet Take 1 tablet (0.25 mg total) by mouth daily as needed for anxiety or sleep. 07/04/21  Yes [name], MD  beclomethasone (QVAR REDIHALER) 80 MCG/ACT inhaler Inhale 2 puffs into the lungs 2 (two) times daily. 07/04/21  Yes [name], MD  buPROPion (WELLBUTRIN XL) 150 MG 24 hr tablet Take 1 tablet (150 mg total) by mouth daily. 07/04/21  Yes [name], MD  docusate (COLACE) 50 MG/5ML liquid Take by mouth daily.   Yes [provider]  DOXYCYCLINE PO Take 100 mg by mouth as needed.   Yes [provider]  esomeprazole (NEXIUM) 40 MG capsule TAKE 1 CAPSULE (40 MG TOTAL) BY MOUTH DAILY BEFORE A MEAL 07/04/21 07/04/22 Yes [name], MD  fluticasone [facility]) 50 MCG/ACT nasal spray Place 2 sprays into both nostrils as needed for allergies. 03/11/16  Yes Shawnee Knapp, MD  levocetirizine (XYZAL) 5 MG tablet Take 5 mg by mouth every evening.   Yes [provider]  Magnesium 500 MG TABS Take 500 mg by mouth daily.   Yes [provider]  Spacer/Aero-Holding Chambers (AEROCHAMBER PLUS WITH MASK) inhaler USE AS DIRECTED AS NEEDED 10/11/20 10/11/21 Yes Mosetta Anis, MD  valACYclovir (VALTREX) 1000 MG tablet Take 1 tablet (1,000 mg total) by mouth daily. For 5 days at first sign of outbreak 03/11/16  Yes Shawnee Knapp, MD  azelastine (ASTELIN) 0.1 % nasal spray SMARTSIG:1-2 Spray(s) Both Nares Twice Daily 08/30/21   [provider]  cholecalciferol (VITAMIN D3) 25 MCG (1000 UNIT) tablet Take 1,000 Units by mouth daily. Patient not taking:  Reported on 11/05/2020    [provider]  levocetirizine (XYZAL) 5 MG tablet Take 1 tablet (5 mg total) by mouth every evening. Patient not taking: Reported on 07/04/2021 02/06/21     montelukast (SINGULAIR) 10 MG tablet Take 10 mg by mouth daily. Has not started yet 08/30/21   [provider]  fluticasone (FLOVENT HFA) 110 MCG/ACT inhaler INHALE 2 PUFFS 2 TIMES DAILY 10/11/20 02/12/21  Mosetta Anis, MD   Social History   Socioeconomic History   Marital status: Married    Spouse name: Not on file   Number of children: 2   Years of education: Not on file   Highest education level: Not on file  Occupational History   Not on file  Tobacco Use   Smoking status: Never   Smokeless tobacco: Never  Vaping Use   Vaping Use: Never used  Substance and Sexual Activity   Alcohol use: Not Currently    Comment: rare   Drug use: No   Sexual activity: Yes    Partners: Male    Birth control/protection: Surgical, Post-menopausal  Other Topics Concern   Not on file  Social History Narrative   Married, 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   Objective:   Vitals:   09/25/21 1344  BP: 128/74  Pulse: 78  Resp: 17  Temp: 98.4 F (36.9 C)  TempSrc: Temporal  SpO2: 97%  Weight: 132 lb 3.2 oz (60 kg)  Height: 5\' 3"  (1.6 m)     Physical Exam Vitals reviewed.  Constitutional:      General: She is not in acute distress.    Appearance: Normal appearance. She is well-developed.  HENT:     Head: Normocephalic and atraumatic.   Cardiovascular:     Rate and Rhythm: Normal rate.  Pulmonary:     Effort: Pulmonary effort is normal.  Neurological:     Mental Status: She is alert and oriented to person, place, and time.  Psychiatric:        Mood and Affect: Mood normal.       Assessment & Plan:  Amy Phillips is a 66 y.o. female . Psychophysiological insomnia - Plan: buPROPion (WELLBUTRIN XL) 150 MG 24 hr tablet  Adjustment disorder with other symptom - Plan: buPROPion (WELLBUTRIN XL) 150 MG 24 hr tablet Improved, tolerating Wellbutrin at 150 mg dose, continue same dose.  Continue sleep hygiene, stress management as discussed last visit.  Recheck 3 to 6 months.  Meds ordered this encounter  Medications   buPROPion (WELLBUTRIN XL) 150 MG 24 hr tablet    Sig: Take 1 tablet (150 mg total) by mouth daily.    Dispense:  90 tablet    Refill:  1   Patient Instructions  I am glad to hear that things are better. Continue Wellbutrin same dose for now and follow up in 3-76months.     Signed,   Merri Ray, MD [place] 09/25/21 2:33 PM

## 2021-12-10 ENCOUNTER — Ambulatory Visit: Payer: 59 | Admitting: Internal Medicine

## 2021-12-26 ENCOUNTER — Encounter: Payer: Self-pay | Admitting: Family Medicine

## 2021-12-26 ENCOUNTER — Ambulatory Visit (INDEPENDENT_AMBULATORY_CARE_PROVIDER_SITE_OTHER): Payer: 59 | Admitting: Family Medicine

## 2021-12-26 DIAGNOSIS — F5104 Psychophysiologic insomnia: Secondary | ICD-10-CM | POA: Diagnosis not present

## 2021-12-26 DIAGNOSIS — F4329 Adjustment disorder with other symptoms: Secondary | ICD-10-CM | POA: Diagnosis not present

## 2021-12-26 MED ORDER — ALPRAZOLAM 0.25 MG PO TABS: 0.2500 mg | ORAL_TABLET | Freq: Every day | ORAL | 0 refills | Status: DC | PRN

## 2021-12-26 NOTE — Progress Notes (Addendum)
? ?Subjective:  ?Patient ID: Amy Phillips, female    DOB: 05/23/1956  Age: 66 y.o. MRN: [id number] ? ?CC:  ?Chief Complaint  ?Patient presents with  ? Insomnia  ?  Pt here for refill on xanax still uses on occasions when needed reports works very well when needed   ? Depression  ?  Pt reports she would like to ween from Wellbutrin   ? ? ?HPI ?Amy Phillips presents for  ? ?Situational stress, insomnia ?Last discussed in January.  Had started on Wellbutrin in October for underlying depression/anxiety with adjustment disorder.  Doing well on 150 mg dose Wellbutrin in January.  Increase mindfulness.  Intermittent dosing of alprazolam for sleep only, not daily. ?Feeling better, would like to wean off Wellbutrin. ?Feels like adjustment d/o and doing better.  ?Still infrequent use of xanax for anxiety flairs - rare use.  ? ?Controlled substance database reviewed.  Last prescription for alprazolam 0.25 mg #30 on 07/04/2021.  No concerns. ? ? ?  12/26/2021  ?  2:10 PM 07/04/2021  ?  1:52 PM 10/07/2019  ?  4:19 PM 01/05/2019  ?  9:21 AM 12/29/2018  ? 10:44 AM  ?Depression screen PHQ 2/9  ?Decreased Interest 1 2 0 0 0  ?Down, Depressed, Hopeless 0 2 0 0 0  ?PHQ - 2 Score 1 4 0 0 0  ?Altered sleeping 1 3     ?Tired, decreased energy 1 3     ?Change in appetite 0 0     ?Feeling bad or failure about yourself  0 0     ?Trouble concentrating 0 0     ?Moving slowly or fidgety/restless 0 0     ?Suicidal thoughts 0 0     ?PHQ-9 Score 3 10     ?Difficult doing work/chores  Somewhat difficult     ? ? ? ?History ?Patient Active Problem List  ? Diagnosis Date Noted  ? Adverse reaction to food 07/04/2021  ? Allergic rhinitis 07/04/2021  ? Allergic rhinitis due to pollen 07/04/2021  ? Cough 07/04/2021  ? Fibroid, uterine 06/12/2015  ? Leiomyoma of uterus, unspecified 12/29/2014  ? Abnormal uterine bleeding 12/29/2014  ? Enlarged uterus 07/28/2014  ? Fibroids 05/24/2013  ? Post-menopausal bleeding 05/24/2013  ? Diverticulosis 07/27/2012  ? Colon  polyps 07/27/2012  ? HYPERLIPIDEMIA-MIXED 10/04/2010  ? Palpitations 10/04/2010  ? DYSPNEA 10/04/2010  ? DYSPNEA ON EXERTION 10/04/2010  ? ?Past Medical History:  ?Diagnosis Date  ? Allergy   ? Anxiety   ? Arrhythmia   ? PVCs- no meds  ? Asthma   ? Basal cell carcinoma of nose 08/19/2017  ? COVID   ? Diverticulitis 08/05/2012  ? Dr Collene Mares  ? GERD (gastroesophageal reflux disease)   ? Ovarian cyst   ? recurrent  ? PVC's (premature ventricular contractions)   ? Uterine fibroid 2006  ? 16 week size  ? ?Past Surgical History:  ?Procedure Laterality Date  ? ABDOMINAL HYSTERECTOMY N/A 06/12/2015  ? Procedure: HYSTERECTOMY ABDOMINAL With Vertical Incision;  Surgeon: Eldred Manges, MD;  ? BILATERAL SALPINGECTOMY  06/12/2015  ? Procedure: BILATERAL SALPINGECTOMY;  Surgeon: Eldred Manges, MD;  Location: [facility];  Service: Gynecology;;  ? COLONOSCOPY    ? DILATATION & CURETTAGE/HYSTEROSCOPY WITH TRUECLEAR N/A 09/08/2013  ? Procedure: Rule;  Surgeon: Azalia Bilis, MD;  Location: [facility];  Service: Gynecology;  Laterality: N/A;  ? DILATION AND CURETTAGE OF UTERUS    ? D&E  x2 missed ab  ? ESOPHAGOGASTRODUODENOSCOPY    ? IR RADIOLOGIST EVAL & MGMT  12/19/2020  ? UPPER GI ENDOSCOPY  07/23/12  ? grade 1 esophagitis noted:  otherwise normal esophagogastrduodenoscopy  ? ?Allergies  ?Allergen Reactions  ? Peanuts [Peanut Oil] Other (See Comments)  ?  Mood changes per pt and husband   ? Shellfish Allergy Other (See Comments)  ?  Throat irritation  ? Strawberry (Diagnostic)   ? ?Prior to Admission medications   ?Medication Sig Start Date End Date Taking? Authorizing Provider  ?albuterol (VENTOLIN HFA) 108 (90 Base) MCG/ACT inhaler Inhale 2 puffs into the lungs every 6 (six) hours as needed for wheezing or shortness of breath. 05/27/19  Yes Maximiano Coss, NP  ?ALPRAZolam (XANAX) 0.25 MG tablet Take 1 tablet (0.25 mg total) by mouth daily as needed for anxiety or sleep. 07/04/21  Yes [name], MD  ?beclomethasone (QVAR REDIHALER) 80 MCG/ACT inhaler Inhale 2 puffs into the lungs 2 (two) times daily. 07/04/21  Yes [name], MD  ?buPROPion (WELLBUTRIN XL) 150 MG 24 hr tablet Take 1 tablet (150 mg total) by mouth daily. 09/25/21  Yes [name], MD  ?docusate (COLACE) 50 MG/5ML liquid Take by mouth daily.   Yes [provider]  ?DOXYCYCLINE PO Take 100 mg by mouth as needed.   Yes [provider]  ?esomeprazole (NEXIUM) 40 MG capsule TAKE 1 CAPSULE (40 MG TOTAL) BY MOUTH DAILY BEFORE A MEAL ?Patient taking differently: Pt is taking 20 mg not 40 mg 07/04/21 07/04/22 Yes [name], MD  ?fluticasone [facility]) 50 MCG/ACT nasal spray Place 2 sprays into both nostrils as needed for allergies. 03/11/16  Yes Shawnee Knapp, MD  ?Magnesium 500 MG TABS Take 500 mg by mouth daily.   Yes [provider]  ?montelukast (SINGULAIR) 10 MG tablet Take 10 mg by mouth daily. Has not started yet 08/30/21  Yes [provider]  ?valACYclovir (VALTREX) 1000 MG tablet Take 1 tablet (1,000 mg total) by mouth daily. For 5 days at first sign of outbreak 03/11/16  Yes Shawnee Knapp, MD  ?levocetirizine (XYZAL) 5 MG tablet Take 5 mg by mouth every evening.    [provider]  ?fluticasone (FLOVENT HFA) 110 MCG/ACT inhaler INHALE 2 PUFFS 2 TIMES DAILY 10/11/20 02/12/21  Mosetta Anis, MD  ? ?Social History  ? ?Socioeconomic History  ? Marital status: Married  ?  Spouse name: Not on file  ? Number of children: 2  ? Years of education: Not on file  ? Highest education level: Not on file  ?Occupational History  ? Not on file  ?Tobacco Use  ? Smoking status: Never  ? Smokeless tobacco: Never  ?Vaping Use  ? Vaping Use: Never used  ?Substance and Sexual Activity  ? Alcohol use: Not Currently  ?  Comment: rare  ? Drug use: No  ? Sexual activity: Yes  ?  Partners: Male  ?  Birth control/protection: Surgical, Post-menopausal  ?Other Topics Concern  ? Not on file  ?Social History  Narrative  ? 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 1 son born in 26  ? 1 daughter born in 82  ? Never smoker no alcohol no caffeine no drug use  ? ?Social Determinants of Health  ? ?Financial Resource Strain: Not on file  ?Food Insecurity: Not on file  ?Transportation Needs: Not on file  ?Physical Activity: Not on  file  ?Stress: Not on file  ?Social Connections: Not on file  ?Intimate Partner Violence: Not on file  ? ? ?Review of Systems ?Per HPI  ? ?Objective:  ? ?Vitals:  ? 12/26/21 1317  ?BP: 126/64  ?Pulse: 74  ?Resp: 17  ?Temp: 98.3 ?F (36.8 ?C)  ?TempSrc: Temporal  ?SpO2: 97%  ?Weight: 131 lb 3.2 oz (59.5 kg)  ?Height: '5\' 3"'$  (1.6 m)  ? ? ? ?Physical Exam ?Constitutional:   ?   General: She is not in acute distress. ?   Appearance: Normal appearance. She is well-developed.  ?HENT:  ?   Head: Normocephalic and atraumatic.  ?Cardiovascular:  ?   Rate and Rhythm: Normal rate.  ?Pulmonary:  ?   Effort: Pulmonary effort is normal.  ?Neurological:  ?   Mental Status: She is alert and oriented to person, place, and time.  ?Psychiatric:     ?   Mood and Affect: Mood normal.     ?   Behavior: Behavior normal.     ?   Thought Content: Thought content normal.  ? ? ? ? ? ?Assessment & Plan:  ?Amy Phillips is a 66 y.o. female . ?Psychophysiological insomnia - Plan: ALPRAZolam (XANAX) 0.25 MG tablet ? ?Adjustment disorder with other symptom - Plan: ALPRAZolam (XANAX) 0.25 MG tablet ? -Prior adjustment disorder improved, mood symptoms improved, will try tapering off Wellbutrin with option to restart if increased mood symptoms.  Still infrequent use of Xanax for breakthrough anxiety, okay to continue the same for now.  Refill ordered ? ?Meds ordered this encounter  ?Medications  ? ALPRAZolam (XANAX) 0.25 MG tablet  ?  Sig: Take 1 tablet (0.25 mg total) by mouth daily as needed for anxiety or sleep.  ?  Dispense:  30 tablet  ?  Refill:  0  ? ?Patient Instructions   ?Glad you are doing better. Ok to taper off wellbutrin, then stop. If any return of mood symptoms, let me know. Thanks for coming in today.  ? ? ? ? ? ?Signed,  ? ?Merri Ray, MD ?[facility]

## 2021-12-26 NOTE — Patient Instructions (Addendum)
Glad you are doing better. Ok to taper off wellbutrin, then stop. If any return of mood symptoms, let me know. Thanks for coming in today.  ? ? ?

## 2022-01-02 ENCOUNTER — Other Ambulatory Visit (INDEPENDENT_AMBULATORY_CARE_PROVIDER_SITE_OTHER): Payer: 59

## 2022-01-02 ENCOUNTER — Encounter: Payer: Self-pay | Admitting: Internal Medicine

## 2022-01-02 ENCOUNTER — Ambulatory Visit (INDEPENDENT_AMBULATORY_CARE_PROVIDER_SITE_OTHER): Payer: 59 | Admitting: Internal Medicine

## 2022-01-02 VITALS — BP 126/82 | HR 83 | Ht 63.0 in | Wt 130.6 lb

## 2022-01-02 DIAGNOSIS — K760 Fatty (change of) liver, not elsewhere classified: Secondary | ICD-10-CM | POA: Diagnosis not present

## 2022-01-02 DIAGNOSIS — R932 Abnormal findings on diagnostic imaging of liver and biliary tract: Secondary | ICD-10-CM

## 2022-01-02 DIAGNOSIS — I728 Aneurysm of other specified arteries: Secondary | ICD-10-CM | POA: Diagnosis not present

## 2022-01-02 DIAGNOSIS — K5732 Diverticulitis of large intestine without perforation or abscess without bleeding: Secondary | ICD-10-CM

## 2022-01-02 LAB — COMPREHENSIVE METABOLIC PANEL
ALT: 23 U/L (ref 0–35)
AST: 16 U/L (ref 0–37)
Albumin: 4.6 g/dL (ref 3.5–5.2)
Alkaline Phosphatase: 53 U/L (ref 39–117)
BUN: 14 mg/dL (ref 6–23)
CO2: 27 mEq/L (ref 19–32)
Calcium: 9.5 mg/dL (ref 8.4–10.5)
Chloride: 104 mEq/L (ref 96–112)
Creatinine, Ser: 0.89 mg/dL (ref 0.40–1.20)
GFR: 68.43 mL/min (ref >60.00)
Glucose, Bld: 105 mg/dL — ABNORMAL HIGH (ref 70–99)
Potassium: 4.1 mEq/L (ref 3.5–5.1)
Sodium: 138 mEq/L (ref 135–145)
Total Bilirubin: 0.4 mg/dL (ref 0.2–1.2)
Total Protein: 6.8 g/dL (ref 6.0–8.3)

## 2022-01-02 LAB — CBC WITH DIFFERENTIAL/PLATELET
Basophils Absolute: 0 10*3/uL (ref 0.0–0.1)
Basophils Relative: 0.5 % (ref 0.0–3.0)
Eosinophils Absolute: 0.1 10*3/uL (ref 0.0–0.7)
Eosinophils Relative: 1.2 % (ref 0.0–5.0)
HCT: 41 % (ref 36.0–46.0)
Hemoglobin: 13.7 g/dL (ref 12.0–15.0)
Lymphocytes Relative: 35.1 % (ref 12.0–46.0)
Lymphs Abs: 2.3 10*3/uL (ref 0.7–4.0)
MCHC: 33.4 g/dL (ref 30.0–36.0)
MCV: 85.5 fl (ref 78.0–100.0)
Monocytes Absolute: 0.4 10*3/uL (ref 0.1–1.0)
Monocytes Relative: 5.4 % (ref 3.0–12.0)
Neutro Abs: 3.8 10*3/uL (ref 1.4–7.7)
Neutrophils Relative %: 57.8 % (ref 43.0–77.0)
Platelets: 216 10*3/uL (ref 150.0–400.0)
RBC: 4.79 Mil/uL (ref 3.87–5.11)
RDW: 13.4 % (ref 11.5–15.5)
WBC: 6.6 10*3/uL (ref 4.0–10.5)

## 2022-01-02 NOTE — Patient Instructions (Signed)
Your provider has requested that you go to the basement level for lab work before leaving today. Press "B" on the elevator. The lab is located at the first door on the left as you exit the elevator. ? ?Due to recent changes in healthcare laws, you may see the results of your imaging and laboratory studies on MyChart before your provider has had a chance to review them.  We understand that in some cases there may be results that are confusing or concerning to you. Not all laboratory results come back in the same time frame and the provider may be waiting for multiple results in order to interpret others.  Please give [place] 48 hours in order for your provider to thoroughly review all the results before contacting the office for clarification of your results.  ? ?You will be contacted by [facility] in the next 2 days to arrange a complete abdominal Ultrasound with elastopathy .  The number on your caller ID will be [phone], please answer when they call.  If you have not heard from them in 2 days please call [phone] to schedule.   ? ?I appreciate the opportunity to care for you. ?Silvano Rusk, MD, [facility] ?

## 2022-01-02 NOTE — Progress Notes (Signed)
? ?Amy Phillips 66 y.o. [birth year] [id number] ? ?Assessment & Plan:  ? ?Encounter Diagnoses  ?Name Primary?  ? Hepatic steatosis Yes  ? Diverticulitis of colon-recurrent   ? Abnormal CT of liver   ? Splenic artery aneurysm ([place])   ? ?Liver issues have been confusing raising question of portal hypertension perhaps.  Imaging suggests hepatic steatosis.  We had a plan to get a liver biopsy but she is concerned about the cost and deductible.  I think she is overall well and we talked about how these findings on CT and other imaging could be a red herring.  We will get labs and elastography testing and figure out where to go.  Should she need to pursue more costly investigation she goes on Medicare later this year and it sounds like she would be more willing at that point. ? ?Splenic artery aneurysm has been stable no plans for follow-up right now. ? ?She does not seem to be having recurrent diverticulitis at this time.  She mentions she does not drink enough water, that might help with her symptomatic diverticulosis which is part of this issue.  Hydration encouraged. ? ? ?Orders Placed This Encounter  ?Procedures  ? US ABDOMEN COMPLETE W/ELASTOGRAPHY  ? CBC with Differential/Platelet  ? Comprehensive metabolic panel  ? ? ? ?CC: [name], MD ? ?Subjective:  ? ?Chief Complaint: Follow-up of recurrent diverticulitis and hepatic steatosis ? 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  Regarding diverticulitis issues though seem quiescent she has some mild left lower quadrant discomfort at times still.  No flares of diverticulitis suspected in the interim. ? ?When I saw her last year I ordered imaging with CT angiography that showed the following on 11/16/2020 ?IMPRESSION: ?Splenic artery aneurysm is unchanged, with the greatest diameter 15 ?mm, and no concerning features by CT. ?  ?Redemonstration of liver steatosis, without overt  cirrhosis. ?Additionally, the IMV is draining retrograde through portosystemic ?collaterals, compatible with increased resistance in the portal ?vein. Correlation with risk factors for developing portal ?hypertension may be useful. ?  ?Diverticular disease. ? ?I talked to IR, Dr. Earleen Newport and we were wondering if she had portal hypertension etc. he saw her in consultation and the option of transjugular biopsy and wedge pressure measurements was given to the patient.  We also talked about ultrasound imaging etc.  The patient has a very high deductible health insurance plan and essentially she could not get a definitive idea about what the cost would be if she has not pursued this.  In the interim she has been well overall.  She does not drink sugary sweetened beverages to any great degree she is trying to minimize carbohydrates and eat whole foods and she does not drink alcohol except on a rare occasion like once every other month. ?  ?Wt Readings from Last 3 Encounters:  ?01/02/22 130 lb 9.6 oz (59.2 kg)  ?12/26/21 131 lb 3.2 oz (59.5 kg)  ?09/25/21 132 lb 3.2 oz (60 kg)  ?128 pounds February 2022 ? ? ?Allergies  ?Allergen Reactions  ? Peanuts [Peanut Oil] Other (See Comments)  ?  Mood changes per pt and husband   ? Shellfish Allergy Other (See Comments)  ?  Throat irritation  ? Strawberry (Diagnostic)   ? ?Current Meds  ?Medication Sig  ? albuterol (VENTOLIN HFA) 108 (90 Base) MCG/ACT inhaler Inhale 2 puffs into the lungs every 6 (six) hours as needed  for wheezing or shortness of breath.  ? ALPRAZolam (XANAX) 0.25 MG tablet Take 1 tablet (0.25 mg total) by mouth daily as needed for anxiety or sleep.  ? Azelaic Acid 15 % gel 1 application to affected area  ? beclomethasone (QVAR REDIHALER) 80 MCG/ACT inhaler Inhale 2 puffs into the lungs 2 (two) times daily.  ? buPROPion (WELLBUTRIN XL) 150 MG 24 hr tablet Take 1 tablet (150 mg total) by mouth daily.  ? cetirizine (ZYRTEC) 10 MG tablet Take 10 mg by mouth daily.  ?  docusate (COLACE) 50 MG/5ML liquid Take by mouth daily.  ? DOXYCYCLINE PO Take 100 mg by mouth as needed.  ? esomeprazole (NEXIUM) 40 MG capsule TAKE 1 CAPSULE (40 MG TOTAL) BY MOUTH DAILY BEFORE A MEAL (Patient taking differently: Pt is taking 20 mg not 40 mg)  ? fluticasone (FLONASE) 50 MCG/ACT nasal spray Place 2 sprays into both nostrils as needed for allergies.  ? Magnesium 500 MG TABS Take 500 mg by mouth daily.  ? metroNIDAZOLE (METROCREAM) 0.75 % cream Apply 1 application. topically 2 (two) times daily.  ? montelukast (SINGULAIR) 10 MG tablet Take 10 mg by mouth daily. Has not started yet  ? tretinoin (RETIN-A) 1.517 % cream 1 application to affected area in the evening to face  ? triamcinolone cream (KENALOG) 0.1 % 1 application  ? valACYclovir (VALTREX) 1000 MG tablet Take 1 tablet (1,000 mg total) by mouth daily. For 5 days at first sign of outbreak  ? ?Past Medical History:  ?Diagnosis Date  ? Allergy   ? Anxiety   ? Arrhythmia   ? PVCs- no meds  ? Asthma   ? Basal cell carcinoma of nose 08/19/2017  ? COVID   ? Diverticulitis 08/05/2012  ? Dr Collene Mares  ? GERD (gastroesophageal reflux disease)   ? Ovarian cyst   ? recurrent  ? PVC's (premature ventricular contractions)   ? Uterine fibroid 2006  ? 16 week size  ? ?Past Surgical History:  ?Procedure Laterality Date  ? ABDOMINAL HYSTERECTOMY N/A 06/12/2015  ? Procedure: HYSTERECTOMY ABDOMINAL With Vertical Incision;  Surgeon: Eldred Manges, MD;  ? BILATERAL SALPINGECTOMY  06/12/2015  ? Procedure: BILATERAL SALPINGECTOMY;  Surgeon: Eldred Manges, MD;  Location: [facility];  Service: Gynecology;;  ? COLONOSCOPY    ? DILATATION & CURETTAGE/HYSTEROSCOPY WITH TRUECLEAR N/A 09/08/2013  ? Procedure: [place];  Surgeon: Azalia Bilis, MD;  Location: [facility];  Service: Gynecology;  Laterality: N/A;  ? DILATION AND CURETTAGE OF UTERUS    ? D&E x2 missed ab  ? ESOPHAGOGASTRODUODENOSCOPY    ? IR RADIOLOGIST EVAL & MGMT  12/19/2020   ? UPPER GI ENDOSCOPY  07/23/12  ? grade 1 esophagitis noted:  otherwise normal esophagogastrduodenoscopy  ? ?Social History  ? ?Social History Narrative  ? 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 1 son born in 70  ? 1 daughter born in 69  ? Never smoker no alcohol no caffeine no drug use  ? ?family history includes Arthritis in her mother and sister; Breast cancer in her mother; COPD in her father and mother; Heart disease in her father and sister; Hypertension in her brother and sister; Lung cancer in her father and mother; Osteoporosis in her mother. ? ? ?Review of Systems ? ?As above ?Objective:  ? Physical Exam ?'@BP'$  126/82   Pulse 83   Ht '5\' 3"'$  (1.6 m)   Wt  130 lb 9.6 oz (59.2 kg)   LMP  (LMP Unknown)   SpO2 98%   BMI 23.13 kg/m? @ ? ?General:  NAD ?Eyes:   anicteric ?Lungs:  clear ?Heart::  S1S2 no rubs, murmurs or gallops ?Abdomen:  soft and nontender, BS+ no significant abdominal obesity may be slightly at best no hepatosplenomegaly or mass no signs of chronic liver disease ?Ext:   no edema, cyanosis or clubbing ? ? ? ?Data Reviewed:  ?See HPI ? ?

## 2022-01-08 ENCOUNTER — Ambulatory Visit (HOSPITAL_COMMUNITY)
Admission: RE | Admit: 2022-01-08 | Discharge: 2022-01-08 | Disposition: A | Discharge status: Home / Self Care | Payer: 59 | Source: Ambulatory Visit | Attending: Internal Medicine | Admitting: Internal Medicine

## 2022-01-08 DIAGNOSIS — K760 Fatty (change of) liver, not elsewhere classified: Secondary | ICD-10-CM | POA: Insufficient documentation

## 2022-01-08 DIAGNOSIS — R932 Abnormal findings on diagnostic imaging of liver and biliary tract: Secondary | ICD-10-CM | POA: Insufficient documentation

## 2022-01-08 DIAGNOSIS — K5732 Diverticulitis of large intestine without perforation or abscess without bleeding: Secondary | ICD-10-CM | POA: Diagnosis present

## 2022-02-11 ENCOUNTER — Other Ambulatory Visit (HOSPITAL_COMMUNITY): Payer: Self-pay

## 2022-02-11 MED ORDER — FLUTICASONE PROPIONATE 50 MCG/ACT NA SUSP
1.0000 | Freq: Every day | NASAL | 6 refills | Status: AC
  Filled 2022-02-11: qty 16, 30d supply, fill #0

## 2022-02-19 ENCOUNTER — Other Ambulatory Visit (HOSPITAL_COMMUNITY): Payer: Self-pay

## 2022-02-28 ENCOUNTER — Other Ambulatory Visit: Payer: Self-pay | Admitting: Obstetrics and Gynecology

## 2022-02-28 DIAGNOSIS — M8580 Other specified disorders of bone density and structure, unspecified site: Secondary | ICD-10-CM

## 2022-04-30 ENCOUNTER — Other Ambulatory Visit: Payer: Self-pay | Admitting: Family Medicine

## 2022-04-30 DIAGNOSIS — F5104 Psychophysiologic insomnia: Secondary | ICD-10-CM

## 2022-04-30 DIAGNOSIS — F4329 Adjustment disorder with other symptoms: Secondary | ICD-10-CM

## 2022-04-30 NOTE — Telephone Encounter (Signed)
Patient is requesting a refill of the following medications: Requested Prescriptions   Pending Prescriptions Disp Refills   buPROPion (WELLBUTRIN XL) 150 MG 24 hr tablet [Pharmacy Med Name: BUPROPION HCL XL 150 MG TABLET] 30 tablet 5    Sig: TAKE 1 TABLET BY MOUTH EVERY DAY    Date of patient request: 04/30/22 Last office visit: 09/25/21 Date of last refill: 12/26/21 Last refill amount: 30

## 2022-05-28 ENCOUNTER — Encounter: Payer: Self-pay | Admitting: Internal Medicine

## 2022-05-28 ENCOUNTER — Ambulatory Visit (INDEPENDENT_AMBULATORY_CARE_PROVIDER_SITE_OTHER): Payer: Medicare Other | Admitting: Internal Medicine

## 2022-05-28 VITALS — BP 120/78 | HR 85 | Ht 63.0 in | Wt 129.6 lb

## 2022-05-28 DIAGNOSIS — K5732 Diverticulitis of large intestine without perforation or abscess without bleeding: Secondary | ICD-10-CM

## 2022-05-28 DIAGNOSIS — R932 Abnormal findings on diagnostic imaging of liver and biliary tract: Secondary | ICD-10-CM | POA: Diagnosis not present

## 2022-05-28 NOTE — Progress Notes (Signed)
Amy Phillips 66 y.o. 1956-04-02 [id number]  Assessment & Plan:   Encounter Diagnoses  Name Primary?   Diverticulitis of colon-recurrent Yes   Abnormal CT of liver    Regarding diverticulitis she will take 10 days total of Augmentin and update me in 10 days from now as to how she is doing.  We discussed how surgery is a more definitive treatment for recurrent diverticulitis.  She is not interested in that.  She is due for a repeat colonoscopy in November on a routine basis and that should be updated.  I want her to recover from this and we will schedule it.  Hepatology referral to [facility] clinic regard and question of steatosis and portal hypertension issues.  I had not done a serologic work-up on her as her transaminases have always been very normal.  I did check celiac studies and they were negative, that can be associated with steatosis.  Question if this is something different.  CC: [name], MD   Subjective:   Chief Complaint: Diverticulitis  HPI 66 year old white woman with a history of recurrent colonic diverticulitis, and abnormal liver imaging question steatosis question portal hypertension who presents for follow-up complaining of suspected recurrent diverticulitis.  She said that about 4 to 5 days ago she started having increasing left lower quadrant pain, she was very very tender, started antibiotics (Augmentin 875 mg) that she had on hand on September 1.  She went on a soft low residue diet there was some nausea no vomiting.  She was able to pass flatus but there were not really bowel movements until yesterday.  She has improved.  She was originally using some tramadol a couple of times but is overall doing better with Tylenol for pain relief right now.  Similar to prior diverticulitis.  Tmax was 100.6.  She questions if eating peanuts last week triggered this as she has testing that indicates she is allergic to peanuts.  She does not get anaphylactic type  issues with peanuts despite that being on her list.  She does have an EpiPen and has followed with allergy.  She also has a history of suspected hepatic steatosis by imaging and question portal hypertension.  She had seen interventional radiology due to her abnormal vasculature and potential plans for investigation by interventional radiology.  She had seen Dr. Earleen Newport in 2022, and the thought was to consider liver biopsy, transjugular fashion, and portal pressure measurement.  She held off on that due to previous bad experience with endometrial biopsy and cost issues.  Transaminases have been normal.  Celiac testing has been normal.  Ultrasound with elastography was negative for fibrosis/cirrhosis 01/08/2022.   IMPRESSION: ULTRASOUND ABDOMEN:   Mildly increased hepatic echogenicity.   ULTRASOUND HEPATIC ELASTOGRAPHY:   Median kPa:  4.5   Diagnostic category:  < or = 5 kPa: high probability of being normal  CT angio abdomen and pelvis with and without contrast 11/16/2020 IMPRESSION: Splenic artery aneurysm is unchanged, with the greatest diameter 15 mm, and no concerning features by CT.   Redemonstration of liver steatosis, without overt cirrhosis. Additionally, the IMV is draining retrograde through portosystemic collaterals, compatible with increased resistance in the portal vein. Correlation with risk factors for developing portal hypertension may be useful.   Diverticular disease.   Additional ancillary findings as above.   Signed,   Dulcy Fanny. Dellia Nims, Elizabeth   [facility]   [facility]     Electronically Signed  By: Corrie Mckusick D.O.   On: 11/16/2020 05:30 Allergies  Allergen Reactions   Peanuts [Peanut Oil] Other (See Comments)    Mood changes per pt and husband    Shellfish Allergy Other (See Comments)    Throat irritation   Strawberry (Diagnostic)    Current Meds  Medication Sig   albuterol (VENTOLIN HFA) 108  (90 Base) MCG/ACT inhaler Inhale 2 puffs into the lungs every 6 (six) hours as needed for wheezing or shortness of breath.   ALPRAZolam (XANAX) 0.25 MG tablet Take 1 tablet (0.25 mg total) by mouth daily as needed for anxiety or sleep.   Azelaic Acid 15 % gel 1 application to affected area   beclomethasone (QVAR REDIHALER) 80 MCG/ACT inhaler Inhale 2 puffs into the lungs 2 (two) times daily.   buPROPion (WELLBUTRIN XL) 150 MG 24 hr tablet TAKE 1 TABLET BY MOUTH EVERY DAY   cetirizine (ZYRTEC) 10 MG tablet Take 10 mg by mouth daily.   docusate (COLACE) 50 MG/5ML liquid Take by mouth daily.   DOXYCYCLINE PO Take 100 mg by mouth as needed.   esomeprazole (NEXIUM) 40 MG capsule TAKE 1 CAPSULE (40 MG TOTAL) BY MOUTH DAILY BEFORE A MEAL (Patient taking differently: Pt is taking 20 mg not 40 mg)   fluticasone (FLONASE) 50 MCG/ACT nasal spray Place 2 sprays into both nostrils as needed for allergies.   fluticasone (FLONASE) 50 MCG/ACT nasal spray Place 1-2 sprays into both nostrils daily.   Magnesium 500 MG TABS Take 500 mg by mouth daily.   metroNIDAZOLE (METROCREAM) 0.75 % cream Apply 1 application. topically 2 (two) times daily.   montelukast (SINGULAIR) 10 MG tablet Take 10 mg by mouth daily. Has not started yet   tretinoin (RETIN-A) 4.010 % cream 1 application to affected area in the evening to face   triamcinolone cream (KENALOG) 0.1 % 1 application   valACYclovir (VALTREX) 1000 MG tablet Take 1 tablet (1,000 mg total) by mouth daily. For 5 days at first sign of outbreak   Past Medical History:  Diagnosis Date   Allergy    Anxiety    Arrhythmia    PVCs- no meds   Asthma    Basal cell carcinoma of nose 08/19/2017   COVID    Diverticulitis 08/05/2012   Dr Collene Mares   GERD (gastroesophageal reflux disease)    Ovarian cyst    recurrent   PVC's (premature ventricular contractions)    Uterine fibroid 2006   16 week size   Past Surgical History:  Procedure Laterality Date   ABDOMINAL  HYSTERECTOMY N/A 06/12/2015   Procedure: HYSTERECTOMY ABDOMINAL With Vertical Incision;  Surgeon: Eldred Manges, MD;   BILATERAL SALPINGECTOMY  06/12/2015   Procedure: BILATERAL SALPINGECTOMY;  Surgeon: Eldred Manges, MD;  Location: [facility];  Service: Gynecology;;   COLONOSCOPY     DILATATION & CURETTAGE/HYSTEROSCOPY WITH TRUECLEAR N/A 09/08/2013   Procedure: [place];  Surgeon: Azalia Bilis, MD;  Location: [facility];  Service: Gynecology;  Laterality: N/A;   DILATION AND CURETTAGE OF UTERUS     D&E x2 missed ab   ESOPHAGOGASTRODUODENOSCOPY     IR RADIOLOGIST EVAL & MGMT  12/19/2020   UPPER GI ENDOSCOPY  07/23/12   grade 1 esophagitis noted:  otherwise normal esophagogastrduodenoscopy   Social History   Social History Narrative   Married, 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   Review of Systems  As per HPI Objective:   Physical Exam BP 120/78   Pulse 85   Ht '5\' 3"'$  (1.6 m)   Wt 129 lb 9.6 oz (58.8 kg)   LMP  (LMP Unknown)   SpO2 95%   BMI 22.96 kg/m  NAD Abd soft, mildly tender LLQ w/o mass or HSM, BS +

## 2022-05-28 NOTE — Patient Instructions (Addendum)
Dr Carlean Purl wants you to take a total of 10 days of the Augmentin. Update Dr Carlean Purl by MyChart on September 18th or sooner if getting worse. After the update Dr Carlean Purl will decide about setting you up for a colonoscopy.   We are going to refer you to the [facility]. They will contact you about an appointment. The phone number is [phone].    I appreciate the opportunity to care for you. Silvano Rusk, MD, [facility]

## 2022-05-29 ENCOUNTER — Encounter: Payer: Self-pay | Admitting: Internal Medicine

## 2022-06-06 ENCOUNTER — Encounter: Payer: Self-pay | Admitting: Internal Medicine

## 2022-06-30 ENCOUNTER — Encounter: Payer: 59 | Admitting: Family Medicine

## 2022-07-14 DIAGNOSIS — K219 Gastro-esophageal reflux disease without esophagitis: Secondary | ICD-10-CM | POA: Insufficient documentation

## 2022-08-12 ENCOUNTER — Ambulatory Visit
Admission: RE | Admit: 2022-08-12 | Discharge: 2022-08-12 | Disposition: A | Discharge status: Home / Self Care | Payer: BLUE CROSS/BLUE SHIELD | Source: Ambulatory Visit | Attending: Obstetrics and Gynecology | Admitting: Obstetrics and Gynecology

## 2022-08-12 DIAGNOSIS — M8580 Other specified disorders of bone density and structure, unspecified site: Secondary | ICD-10-CM

## 2022-08-27 ENCOUNTER — Other Ambulatory Visit: Payer: Self-pay | Admitting: Nurse Practitioner

## 2022-08-27 DIAGNOSIS — J4520 Mild intermittent asthma, uncomplicated: Secondary | ICD-10-CM | POA: Insufficient documentation

## 2022-08-27 DIAGNOSIS — K766 Portal hypertension: Secondary | ICD-10-CM

## 2022-08-27 DIAGNOSIS — K760 Fatty (change of) liver, not elsewhere classified: Secondary | ICD-10-CM | POA: Insufficient documentation

## 2022-09-10 ENCOUNTER — Other Ambulatory Visit: Payer: Self-pay | Admitting: Nurse Practitioner

## 2022-09-10 ENCOUNTER — Ambulatory Visit
Admission: RE | Admit: 2022-09-10 | Discharge: 2022-09-10 | Disposition: A | Discharge status: Home / Self Care | Payer: Medicare Other | Source: Ambulatory Visit | Attending: Nurse Practitioner | Admitting: Nurse Practitioner

## 2022-09-10 DIAGNOSIS — K760 Fatty (change of) liver, not elsewhere classified: Secondary | ICD-10-CM

## 2022-09-10 DIAGNOSIS — K766 Portal hypertension: Secondary | ICD-10-CM

## 2022-09-29 ENCOUNTER — Other Ambulatory Visit: Payer: Medicare Other

## 2022-10-13 ENCOUNTER — Other Ambulatory Visit: Payer: Self-pay | Admitting: Family Medicine

## 2022-10-13 DIAGNOSIS — F5104 Psychophysiologic insomnia: Secondary | ICD-10-CM

## 2022-10-13 DIAGNOSIS — F4329 Adjustment disorder with other symptoms: Secondary | ICD-10-CM

## 2022-12-11 ENCOUNTER — Encounter: Payer: 59 | Admitting: Family Medicine

## 2022-12-23 ENCOUNTER — Telehealth: Payer: Medicare Other | Admitting: Physician Assistant

## 2022-12-23 DIAGNOSIS — J019 Acute sinusitis, unspecified: Secondary | ICD-10-CM

## 2022-12-23 DIAGNOSIS — B9689 Other specified bacterial agents as the cause of diseases classified elsewhere: Secondary | ICD-10-CM

## 2022-12-23 MED ORDER — AMOXICILLIN-POT CLAVULANATE 875-125 MG PO TABS: 1.0000 | ORAL_TABLET | Freq: Two times a day (BID) | ORAL | 0 refills | Status: DC

## 2022-12-23 NOTE — Progress Notes (Signed)

## 2022-12-23 NOTE — Progress Notes (Signed)
I have spent 5 minutes in review of e-visit questionnaire, review and updating patient chart, medical decision making and response to patient.   Benoit Meech Cody Falon [place], PA-C    

## 2023-01-07 ENCOUNTER — Encounter: Payer: 59 | Admitting: Family Medicine

## 2023-03-03 DIAGNOSIS — R7303 Prediabetes: Secondary | ICD-10-CM | POA: Insufficient documentation

## 2023-03-03 DIAGNOSIS — I728 Aneurysm of other specified arteries: Secondary | ICD-10-CM | POA: Insufficient documentation

## 2023-03-04 ENCOUNTER — Other Ambulatory Visit: Payer: Self-pay | Admitting: Nurse Practitioner

## 2023-03-04 DIAGNOSIS — K766 Portal hypertension: Secondary | ICD-10-CM

## 2023-03-04 DIAGNOSIS — I728 Aneurysm of other specified arteries: Secondary | ICD-10-CM

## 2023-04-05 NOTE — Progress Notes (Unsigned)
Subjective:  Patient ID: Amy Phillips, female    DOB: Nov 16, 1955  Age: 67 y.o. MRN: [id number]  CC:  Chief Complaint  Patient presents with   Medical Management of Chronic Issues    "Annual exam" pt has a question about puller ligaments in hands /fingers otherwise doing well, pt asking if the Qvar redihaler comes in a aerosol due to problems with the redihaler     HPI Amy Phillips presents for  Follow-up.  Last visit with me April 2023.  Finger injuries: Pulled rings off in 2016, swelling in both ring fingers, change in appearance since that time. No change in activity. Some joint pain in winter only.   Hepatic steatosis, concern for portal hypertension on imaging Followed at [facility], last visit noted in June.  Concern for portal hypertension given portosystemic collaterals and splenic artery aneurysm on CT angiogram.  No splenomegaly or thrombocytopenia, and normal liver chemistries.  Ultrasound ordered to screen for Budd-Chiari syndrome.  No focal lesions identified in the liver.  Mildly increased hepatic echogenicity.Doppler without evidence of portal hypertension.  Diagnosed with metabolic dysfunction associated steatotic liver disease.  Biopsy was deferred.  Continued conservative monitoring.  Plan for annual monitoring of splenic artery aneurysm, CT angio abdomen planned for July 31.  Local gastroenterologist Dr. Leone Payor.  Has taken PPI Nexium 40 mg daily for GERD. Overall works ok.   Osteopenia Also followed by OB/GYN.  Dr. Su Hilt. Bone density reviewed from November 2023, T-score -1.1 at the left femoral neck. Calcium/vitamin D: takes both. Unknown dose.   Situational stress/insomnia Discussed in April 2023, doing well at that time and wanted to wean off Wellbutrin.  Previously on 150 mg dose.  Tried weaning off for 1 month, trouble with motivation. Better back on meds. Intermittent dosing of alprazolam for sleep only and not daily. Controlled  substance database reviewed.  #30 alprazolam last filled on 02/04/2022, no concerns.2 left.      04/06/2023    1:28 PM 12/26/2021    2:10 PM 07/04/2021    1:52 PM 10/07/2019    4:19 PM 01/05/2019    9:21 AM  Depression screen PHQ 2/9  Decreased Interest 0 1 2 0 0  Down, Depressed, Hopeless 0 0 2 0 0  PHQ - 2 Score 0 1 4 0 0  Altered sleeping 0 1 3    Tired, decreased energy 0 1 3    Change in appetite 0 0 0    Feeling bad or failure about yourself  0 0 0    Trouble concentrating 0 0 0    Moving slowly or fidgety/restless 0 0 0    Suicidal thoughts 0 0 0    PHQ-9 Score 0 3 10    Difficult doing work/chores   Somewhat difficult      Asthma Followed by allergist, Dr.  Callas. history of moderate persistent asthma.  Albuterol as needed with Qvar 80 mcg as maintenance medication.  Also on Singulair, Flonase, Zyrtec for allergies. Not using Qvar, had yeast infection in throat - self diagnosed, nystatin Rx by allergist, sore throat resolved. Few weeks ago. May have missed rinsing mouth at times. Throat, not tongue.  Has not started redihaler. Concern about powder. Asthma has been doing well off that med for now. Has albuterol if needed - not needed recently.   Prediabetes: Noted on labs through [facility], A1c 6.0 on 08/27/2022. Has improved diet since that readings. Down 5# on her scale. Eating  healthier. Joined [employer] - exercising 2 days per week. Plan to watch LFT's with monitoring A1c per hepatology.  No results found for: "HGBA1C" Wt Readings from Last 3 Encounters:  04/06/23 130 lb 9.6 oz (59.2 kg)  05/28/22 129 lb 9.6 oz (58.8 kg)  01/02/22 130 lb 9.6 oz (59.2 kg)   Hyperlipidemia: No current statin, lipid panel elevated on 08/27/2022 at [facility].  At that time total cholesterol 326, triglycerides 166,HDL 75, LDL 220. No current statin. She chose to try diet/exercise approach initially. If meds recommended, hepatology has one med in mind with repeat LFTS in 2 weeks.  FH of HLD -  father, passed in 58, father with hx of CAD in 96's.  Would like to try diet/exercise approach for 6 months.  Fasting today.  No results found for: "CHOL", "HDL", "LDLCALC", "LDLDIRECT", "TRIG", "CHOLHDL" Lab Results  Component Value Date   ALT 23 01/02/2022   AST 16 01/02/2022   ALKPHOS 53 01/02/2022   BILITOT 0.4 01/02/2022   History Patient Active Problem List   Diagnosis Date Noted   Adverse reaction to food 07/04/2021   Allergic rhinitis 07/04/2021   Allergic rhinitis due to pollen 07/04/2021   Cough 07/04/2021   Fibroid, uterine 06/12/2015   Leiomyoma of uterus, unspecified 12/29/2014   Abnormal uterine bleeding 12/29/2014   Enlarged uterus 07/28/2014   Fibroids 05/24/2013   Post-menopausal bleeding 05/24/2013   Diverticulosis 07/27/2012   Colon polyps 07/27/2012   HYPERLIPIDEMIA-MIXED 10/04/2010   Palpitations 10/04/2010   DYSPNEA 10/04/2010   DYSPNEA ON EXERTION 10/04/2010   Past Medical History:  Diagnosis Date   Allergy    Anxiety    Arrhythmia    PVCs- no meds   Asthma    Basal cell carcinoma of nose 08/19/2017   COVID    Diverticulitis 08/05/2012   Dr Loreta Ave   GERD (gastroesophageal reflux disease)    Ovarian cyst    recurrent   PVC's (premature ventricular contractions)    Uterine fibroid 2006   16 week size   Past Surgical History:  Procedure Laterality Date   ABDOMINAL HYSTERECTOMY N/A 06/12/2015   Procedure: HYSTERECTOMY ABDOMINAL With Vertical Incision;  Surgeon: Hal Morales, MD;   BILATERAL SALPINGECTOMY  06/12/2015   Procedure: BILATERAL SALPINGECTOMY;  Surgeon: Hal Morales, MD;  Location: [facility];  Service: Gynecology;;   COLONOSCOPY     DILATATION & CURETTAGE/HYSTEROSCOPY WITH TRUECLEAR N/A 09/08/2013   Procedure: DILATATION & CURETTAGE/HYSTEROSCOPY WITH TRUCLEAR;  Surgeon: Bennye Alm, MD;  Location: [facility];  Service: Gynecology;  Laterality: N/A;   DILATION AND CURETTAGE OF UTERUS     D&E x2 missed ab    ESOPHAGOGASTRODUODENOSCOPY     IR RADIOLOGIST EVAL & MGMT  12/19/2020   UPPER GI ENDOSCOPY  07/23/12   grade 1 esophagitis noted:  otherwise normal esophagogastrduodenoscopy   Allergies  Allergen Reactions   Peanuts [Peanut Oil] Other (See Comments)    Mood changes per pt and husband    Shellfish Allergy Other (See Comments)    Throat irritation   Strawberry (Diagnostic)    Prior to Admission medications   Medication Sig Start Date End Date Taking? Authorizing Provider  albuterol (VENTOLIN HFA) 108 (90 Base) MCG/ACT inhaler Inhale 2 puffs into the lungs every 6 (six) hours as needed for wheezing or shortness of breath. 05/27/19   Janeece Agee, NP  ALPRAZolam Prudy Feeler) 0.25 MG tablet Take 1 tablet (0.25 mg total) by mouth daily as needed for anxiety or sleep. 12/26/21  Shade Flood, MD  amoxicillin-clavulanate (AUGMENTIN) 875-125 MG tablet Take 1 tablet by mouth 2 (two) times daily. 12/23/22   Waldon Merl, PA-C  Azelaic Acid 15 % gel 1 application to affected area    [provider]  beclomethasone (QVAR REDIHALER) 80 MCG/ACT inhaler Inhale 2 puffs into the lungs 2 (two) times daily. 07/04/21   Shade Flood, MD  buPROPion (WELLBUTRIN XL) 150 MG 24 hr tablet TAKE 1 TABLET BY MOUTH EVERY DAY 10/13/22   Shade Flood, MD  cetirizine (ZYRTEC) 10 MG tablet Take 10 mg by mouth daily.    [provider]  docusate (COLACE) 50 MG/5ML liquid Take by mouth daily.    [provider]  esomeprazole (NEXIUM) 40 MG capsule TAKE 1 CAPSULE (40 MG TOTAL) BY MOUTH DAILY BEFORE A MEAL Patient taking differently: Pt is taking 20 mg not 40 mg 07/04/21 07/04/22  Shade Flood, MD  fluticasone (FLONASE) 50 MCG/ACT nasal spray Place 2 sprays into both nostrils as needed for allergies. 03/11/16   Sherren Mocha, MD  fluticasone (FLONASE) 50 MCG/ACT nasal spray Place 1-2 sprays into both nostrils daily. 02/11/22     Magnesium 500 MG TABS Take 500 mg by mouth daily.    [provider]  metroNIDAZOLE (METROCREAM) 0.75 % cream Apply 1 application. topically 2 (two) times daily. 11/04/21   [provider]  montelukast (SINGULAIR) 10 MG tablet Take 10 mg by mouth daily. Has not started yet 08/30/21   [provider]  tretinoin (RETIN-A) 0.025 % cream 1 application to affected area in the evening to face 08/16/19   [provider]  triamcinolone cream (KENALOG) 0.1 % 1 application 09/13/19   [provider]  valACYclovir (VALTREX) 1000 MG tablet Take 1 tablet (1,000 mg total) by mouth daily. For 5 days at first sign of outbreak 03/11/16   Sherren Mocha, MD  fluticasone [facility]) 110 MCG/ACT inhaler INHALE 2 PUFFS 2 TIMES DAILY 10/11/20 02/12/21  Sidney Ace, MD   Social History   Socioeconomic History   Marital status: Married    Spouse name: Not on file   Number of children: 2   Years of education: Not on file   Highest education level: Not on file  Occupational History   Not on file  Tobacco Use   Smoking status: Never   Smokeless tobacco: Never  Vaping Use   Vaping status: Never Used  Substance and Sexual Activity   Alcohol use: Not Currently    Comment: rare   Drug use: No   Sexual activity: Yes    Partners: Male    Birth control/protection: Surgical, Post-menopausal  Other Topics Concern   Not on file  Social History Narrative   Married, 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 : Never true  Transportation Needs: Not on file (08/27/2022)  Physical Activity: Not on file  Stress: Not on file  Social Connections: Not on file  Intimate Partner Violence: Not on file    Review of Systems  Constitutional:  Negative for fatigue and unexpected weight change.  Respiratory:  Negative for chest tightness and shortness of breath.   Cardiovascular:  Negative for chest pain, palpitations and leg swelling.  Gastrointestinal:  Negative for abdominal pain and blood in stool.  Neurological:  Negative for dizziness, syncope, light-headedness and headaches.     Objective:   Vitals:   04/06/23 1330  BP: 124/68  Pulse: 75  Temp: 98.1 F (36.7 C)  TempSrc: Temporal  SpO2: 99%  Weight: 130 lb 9.6 oz (59.2 kg)  Height: 5\' 2"  (1.575 m)     Physical Exam Vitals reviewed.  Constitutional:      Appearance: Normal appearance. She is well-developed.  HENT:     Head: Normocephalic and atraumatic.  Eyes:     Conjunctiva/sclera: Conjunctivae normal.     Pupils: Pupils are equal, round, and reactive to light.  Neck:     Vascular: No carotid bruit.  Cardiovascular:     Rate and Rhythm: Normal rate and regular rhythm.     Heart sounds: Normal heart sounds.  Pulmonary:     Effort: Pulmonary effort is normal.     Breath sounds: Normal breath sounds.  Abdominal:     Palpations: Abdomen is soft. There is no pulsatile mass.     Tenderness: There is no abdominal tenderness.  Musculoskeletal:     Right lower leg: No edema.     Left lower leg: No edema.     Comments: Bilateral fourth phalanges/ring fingers with slight flexion at the PIP, extension of the DIP, pain-free range of motion and intact extension and flexion at DIP, PIP.  Skin:    General: Skin is warm and dry.  Neurological:     Mental Status: She is alert and oriented to person, place, and time.  Psychiatric:        Mood and Affect: Mood normal.        Behavior: Behavior normal.     Assessment & Plan:   Amy Phillips is a 67 y.o. female . Metabolic dysfunction-associated steatotic liver disease (MASLD) - Plan: Comprehensive metabolic panel  -Upcoming CT angiogram pending. Continue follow-up with liver specialist.  Close monitoring of LFTs if any statin apparently specific statin has been recommended by liver specialist if that is needed.  Moderate persistent asthma without complication  -Overall stable.  Concern as above regarding esophageal candidiasis and use steroid inhaler.  Recommended she consult her allergist for change in therapy or change in device.  Prior suspected candidiasis symptoms have resolved.  Psychophysiological insomnia - Plan: ALPRAZolam (XANAX) 0.25 MG tablet, buPROPion (WELLBUTRIN XL) 150 MG 24 hr tablet  -Overall stable as above, rare need for alprazolam.  Refilled.  No concerns on review of controlled substance database.  Hyperlipidemia, unspecified hyperlipidemia type - Plan: Comprehensive metabolic panel, Lipid panel  -Check updated labs but significant elevated LDL previously, suspected familial component.  Concerns discussed given her family history of heart disease.  Did discuss likely need for statin versus meeting with lipid clinic for further discussion of treatment options.  Would need to monitor liver function closely given info above.  Coronary calcium scoring also an option.  She would like to try diet/exercise approach for initial 6 months regardless but will consider other options as above.  Labs  ordered.  Prediabetes - Plan: Hemoglobin A1c  -Diet/exercise approach, check A1c and adjust plan accordingly  Adjustment disorder with other symptom - Plan: ALPRAZolam (XANAX) 0.25 MG tablet, buPROPion (WELLBUTRIN XL) 150 MG 24 hr tablet  -Overall stable with Wellbutrin, trial off meds unsuccessful.  Tolerating current dose, continue same.  As above rare need for alprazolam, refilled.  Gastroesophageal reflux disease without esophagitis - Plan: esomeprazole  (NEXIUM) 40 MG capsule  -Overall stable with Nexium, continue same.  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  Minimal discomfort and option to meet with hand specialist, deferred for now.  I am happy to place a referral if she would like.  Meds ordered this encounter  Medications   ALPRAZolam (XANAX) 0.25 MG tablet    Sig: Take 1 tablet (0.25 mg total) by mouth daily as needed for anxiety or sleep.    Dispense:  30 tablet    Refill:  0   buPROPion (WELLBUTRIN XL) 150 MG 24 hr tablet    Sig: Take 1 tablet (150 mg total) by mouth daily.    Dispense:  90 tablet    Refill:  2   esomeprazole (NEXIUM) 40 MG capsule    Sig: TAKE 1 CAPSULE (40 MG TOTAL) BY MOUTH DAILY BEFORE A MEAL    Dispense:  90 capsule    Refill:  1   Patient Instructions  If any questions on the acid blocker, contact your gastroenterologist.  1200-1500mg  calcium per day and vitamin D at least 800units per day for osteopenia.  Stay on wellbutrin for now. Xanax if needed.  Contact your allergist about options for inhalers.   I will repeat the prediabetes test today. Keep up the good work.   I will recheck cholesterol today, then can discuss options (meds, meeting with Dr. Rennis Golden, or coronary calcium scoring).  Fingers appear to have boutenierre deformity - possibly from old injury to tendon over knuckle. I am happy to refer you to hand specialist to discuss further if needed.           Signed,   Meredith Staggers, MD  Primary Care, [facility] 04/06/23 2:22 PM

## 2023-04-06 ENCOUNTER — Ambulatory Visit (INDEPENDENT_AMBULATORY_CARE_PROVIDER_SITE_OTHER): Payer: Medicare Other | Admitting: Family Medicine

## 2023-04-06 ENCOUNTER — Other Ambulatory Visit (HOSPITAL_COMMUNITY): Payer: Self-pay

## 2023-04-06 ENCOUNTER — Encounter: Payer: Self-pay | Admitting: Family Medicine

## 2023-04-06 VITALS — BP 124/68 | HR 75 | Temp 98.1°F | Ht 62.0 in | Wt 130.6 lb

## 2023-04-06 DIAGNOSIS — K219 Gastro-esophageal reflux disease without esophagitis: Secondary | ICD-10-CM

## 2023-04-06 DIAGNOSIS — R7303 Prediabetes: Secondary | ICD-10-CM

## 2023-04-06 DIAGNOSIS — F4329 Adjustment disorder with other symptoms: Secondary | ICD-10-CM

## 2023-04-06 DIAGNOSIS — K760 Fatty (change of) liver, not elsewhere classified: Secondary | ICD-10-CM | POA: Diagnosis not present

## 2023-04-06 DIAGNOSIS — J4540 Moderate persistent asthma, uncomplicated: Secondary | ICD-10-CM

## 2023-04-06 DIAGNOSIS — F5104 Psychophysiologic insomnia: Secondary | ICD-10-CM | POA: Diagnosis not present

## 2023-04-06 DIAGNOSIS — E785 Hyperlipidemia, unspecified: Secondary | ICD-10-CM | POA: Diagnosis not present

## 2023-04-06 DIAGNOSIS — M20021 Boutonniere deformity of right finger(s): Secondary | ICD-10-CM

## 2023-04-06 DIAGNOSIS — M20022 Boutonniere deformity of left finger(s): Secondary | ICD-10-CM

## 2023-04-06 LAB — HEMOGLOBIN A1C: Hgb A1c MFr Bld: 6 % (ref 4.6–6.5)

## 2023-04-06 MED ORDER — ALPRAZOLAM 0.25 MG PO TABS
0.2500 mg | ORAL_TABLET | Freq: Every day | ORAL | 0 refills | Status: DC | PRN
  Filled 2023-04-06 – 2023-04-23 (×2): qty 30, 30d supply, fill #0

## 2023-04-06 MED ORDER — BUPROPION HCL ER (XL) 150 MG PO TB24
150.0000 mg | ORAL_TABLET | Freq: Every day | ORAL | 2 refills | Status: DC
  Filled 2023-04-06 – 2023-04-23 (×2): qty 90, 90d supply, fill #0

## 2023-04-06 MED ORDER — ESOMEPRAZOLE MAGNESIUM 40 MG PO CPDR
40.0000 mg | DELAYED_RELEASE_CAPSULE | Freq: Every day | ORAL | 1 refills | Status: DC
  Filled 2023-04-06 – 2023-04-23 (×2): qty 90, 90d supply, fill #0

## 2023-04-06 NOTE — Patient Instructions (Addendum)
If any questions on the acid blocker, contact your gastroenterologist.  1200-1500mg  calcium per day and vitamin D at least 800units per day for osteopenia.  Stay on wellbutrin for now. Xanax if needed.  Contact your allergist about options for inhalers.   I will repeat the prediabetes test today. Keep up the good work.  I expect the numbers will look better.  I will recheck cholesterol today, then can discuss options (meds, meeting with Dr. Rennis Golden, or coronary calcium scoring).  Hopefully will see some improvement but as we discussed I am concerned with the level of the LDL on prior testing.  Please let me know if there are questions.  Fingers appear to have boutenierre deformity - possibly from old injury to tendon over knuckle. I am happy to refer you to hand specialist to discuss further if needed.   Thanks for coming in today.  Take care!

## 2023-04-07 ENCOUNTER — Encounter: Payer: Self-pay | Admitting: Family Medicine

## 2023-04-07 LAB — COMPREHENSIVE METABOLIC PANEL
ALT: 17 U/L (ref 0–35)
AST: 16 U/L (ref 0–37)
Albumin: 4.3 g/dL (ref 3.5–5.2)
Alkaline Phosphatase: 54 U/L (ref 39–117)
BUN: 13 mg/dL (ref 6–23)
CO2: 27 mEq/L (ref 19–32)
Calcium: 9.6 mg/dL (ref 8.4–10.5)
Chloride: 106 mEq/L (ref 96–112)
Creatinine, Ser: 0.84 mg/dL (ref 0.40–1.20)
GFR: 72.7 mL/min (ref >60.00)
Glucose, Bld: 99 mg/dL (ref 70–99)
Potassium: 4.2 mEq/L (ref 3.5–5.1)
Sodium: 139 mEq/L (ref 135–145)
Total Bilirubin: 0.4 mg/dL (ref 0.2–1.2)
Total Protein: 6.6 g/dL (ref 6.0–8.3)

## 2023-04-07 LAB — LIPID PANEL
Cholesterol: 246 mg/dL — ABNORMAL HIGH (ref 0–200)
HDL: 59.8 mg/dL (ref >39.00)
LDL Cholesterol: 151 mg/dL — ABNORMAL HIGH (ref 0–99)
NonHDL: 186.46
Total CHOL/HDL Ratio: 4
Triglycerides: 177 mg/dL — ABNORMAL HIGH (ref 0.0–149.0)
VLDL: 35.4 mg/dL (ref 0.0–40.0)

## 2023-04-16 ENCOUNTER — Other Ambulatory Visit (HOSPITAL_COMMUNITY): Payer: Self-pay

## 2023-04-21 ENCOUNTER — Encounter: Payer: Self-pay | Admitting: Family Medicine

## 2023-04-22 ENCOUNTER — Other Ambulatory Visit: Payer: Medicare Other

## 2023-04-22 MED ORDER — ONDANSETRON HCL 4 MG PO TABS
4.0000 mg | ORAL_TABLET | Freq: Three times a day (TID) | ORAL | 0 refills | Status: DC | PRN
  Filled 2023-04-22: qty 10, 4d supply, fill #0

## 2023-04-23 ENCOUNTER — Other Ambulatory Visit (HOSPITAL_COMMUNITY): Payer: Self-pay

## 2023-04-24 ENCOUNTER — Other Ambulatory Visit: Payer: Medicare Other

## 2023-04-27 ENCOUNTER — Telehealth: Payer: Self-pay

## 2023-04-27 NOTE — Telephone Encounter (Signed)
Called pt back and discussed results with her

## 2023-04-27 NOTE — Telephone Encounter (Signed)
  Electrolytes, kidney, liver tests looked okay and 57-month blood sugar test still at prediabetes level.  Cholesterol levels were elevated but did not look as bad as the previous readings.  That is good news!  I think it is reasonable to try the diet and exercise approach as we discussed and recheck those levels in the next 3 to 6 months or can check the coronary calcium scoring anytime as we discussed last visit.  Just keep me posted and let me know if you have any questions.  Thanks again for coming in the other day and please let me know if there are any questions from that visit.  Take care.   Dr. Neva Seat

## 2023-04-27 NOTE — Telephone Encounter (Signed)
Amy Phillips returned your Amy Phillips) call.

## 2023-05-12 ENCOUNTER — Ambulatory Visit
Admission: RE | Admit: 2023-05-12 | Discharge: 2023-05-12 | Disposition: A | Discharge status: Home / Self Care | Payer: Medicare Other | Source: Ambulatory Visit | Attending: Nurse Practitioner | Admitting: Nurse Practitioner

## 2023-05-12 DIAGNOSIS — K766 Portal hypertension: Secondary | ICD-10-CM

## 2023-05-12 DIAGNOSIS — I728 Aneurysm of other specified arteries: Secondary | ICD-10-CM

## 2023-05-12 MED ORDER — IOPAMIDOL (ISOVUE-370) INJECTION 76%
100.0000 mL | Freq: Once | INTRAVENOUS | Status: AC | PRN
  Administered 2023-05-12: 75 mL via INTRAVENOUS

## 2023-05-25 ENCOUNTER — Telehealth: Payer: Medicare Other | Admitting: Physician Assistant

## 2023-05-25 DIAGNOSIS — J019 Acute sinusitis, unspecified: Secondary | ICD-10-CM | POA: Diagnosis not present

## 2023-05-25 DIAGNOSIS — B9689 Other specified bacterial agents as the cause of diseases classified elsewhere: Secondary | ICD-10-CM

## 2023-05-25 MED ORDER — AMOXICILLIN-POT CLAVULANATE 875-125 MG PO TABS: 1.0000 | ORAL_TABLET | Freq: Two times a day (BID) | ORAL | 0 refills | Status: DC

## 2023-05-25 NOTE — Progress Notes (Signed)
E-Visit for Sinus Problems  We are sorry that you are not feeling well.  Here is how we plan to help!  Based on what you have shared with me it looks like you have sinusitis.  Sinusitis is inflammation and infection in the sinus cavities of the head.  Based on your presentation I believe you most likely have Acute Bacterial Sinusitis.  This is an infection caused by bacteria and is treated with antibiotics. I have prescribed Augmentin 875mg/125mg one tablet twice daily with food, for 7 days. You may use an oral decongestant such as Mucinex D or if you have glaucoma or high blood pressure use plain Mucinex. Saline nasal spray help and can safely be used as often as needed for congestion.  If you develop worsening sinus pain, fever or notice severe headache and vision changes, or if symptoms are not better after completion of antibiotic, please schedule an appointment with a health care provider.    Sinus infections are not as easily transmitted as other respiratory infection, however we still recommend that you avoid close contact with loved ones, especially the very young and elderly.  Remember to wash your hands thoroughly throughout the day as this is the number one way to prevent the spread of infection!  Home Care: Only take medications as instructed by your medical team. Complete the entire course of an antibiotic. Do not take these medications with alcohol. A steam or ultrasonic humidifier can help congestion.  You can place a towel over your head and breathe in the steam from hot water coming from a faucet. Avoid close contacts especially the very young and the elderly. Cover your mouth when you cough or sneeze. Always remember to wash your hands.  Get Help Right Away If: You develop worsening fever or sinus pain. You develop a severe head ache or visual changes. Your symptoms persist after you have completed your treatment plan.  Make sure you Understand these instructions. Will watch  your condition. Will get help right away if you are not doing well or get worse.  Thank you for choosing an e-visit.  Your e-visit answers were reviewed by a board certified advanced clinical practitioner to complete your personal care plan. Depending upon the condition, your plan could have included both over the counter or prescription medications.  Please review your pharmacy choice. Make sure the pharmacy is open so you can pick up prescription now. If there is a problem, you may contact your provider through MyChart messaging and have the prescription routed to another pharmacy.  Your safety is important to us. If you have drug allergies check your prescription carefully.   For the next 24 hours you can use MyChart to ask questions about today's visit, request a non-urgent call back, or ask for a work or school excuse. You will get an email in the next two days asking about your experience. I hope that your e-visit has been valuable and will speed your recovery.  I have spent 5 minutes in review of e-visit questionnaire, review and updating patient chart, medical decision making and response to patient.   Jennifer M Burnette, PA-C  

## 2023-05-27 ENCOUNTER — Encounter: Payer: Self-pay | Admitting: Family Medicine

## 2023-05-27 DIAGNOSIS — N2889 Other specified disorders of kidney and ureter: Secondary | ICD-10-CM

## 2023-05-29 NOTE — Telephone Encounter (Signed)
CT angiography abdomen noted from 05/12/2023.  Hypodense lesion in upper to mid pole right kidney laterally measuring 1.5 cm, previously 9 mm in 2022, not seen in 2020.  MRI recommended with and without contrast, I will order that study possible urology eval subsequently.

## 2023-05-29 NOTE — Addendum Note (Signed)
Addended by: Meredith Staggers R on: 05/29/2023 09:21 AM   Modules accepted: Orders

## 2023-06-10 ENCOUNTER — Telehealth: Payer: Self-pay | Admitting: Internal Medicine

## 2023-06-10 ENCOUNTER — Ambulatory Visit (INDEPENDENT_AMBULATORY_CARE_PROVIDER_SITE_OTHER): Payer: Medicare Other | Admitting: Internal Medicine

## 2023-06-10 ENCOUNTER — Encounter: Payer: Self-pay | Admitting: Internal Medicine

## 2023-06-10 VITALS — BP 118/68 | HR 83 | Ht 62.0 in | Wt 127.0 lb

## 2023-06-10 DIAGNOSIS — I728 Aneurysm of other specified arteries: Secondary | ICD-10-CM

## 2023-06-10 DIAGNOSIS — B9689 Other specified bacterial agents as the cause of diseases classified elsewhere: Secondary | ICD-10-CM

## 2023-06-10 DIAGNOSIS — K760 Fatty (change of) liver, not elsewhere classified: Secondary | ICD-10-CM | POA: Diagnosis not present

## 2023-06-10 DIAGNOSIS — J019 Acute sinusitis, unspecified: Secondary | ICD-10-CM

## 2023-06-10 DIAGNOSIS — K5732 Diverticulitis of large intestine without perforation or abscess without bleeding: Secondary | ICD-10-CM | POA: Diagnosis not present

## 2023-06-10 DIAGNOSIS — K2100 Gastro-esophageal reflux disease with esophagitis, without bleeding: Secondary | ICD-10-CM

## 2023-06-10 MED ORDER — AMOXICILLIN-POT CLAVULANATE 875-125 MG PO TABS: 1.0000 | ORAL_TABLET | Freq: Two times a day (BID) | ORAL | 0 refills | Status: DC

## 2023-06-10 MED ORDER — ONDANSETRON 4 MG PO TBDP: 4.0000 mg | ORAL_TABLET | Freq: Three times a day (TID) | ORAL | 1 refills | Status: AC | PRN

## 2023-06-10 NOTE — Progress Notes (Signed)
Amy Phillips 67 y.o. [birth year] [id number]  Assessment & Plan:   Encounter Diagnoses  Name Primary?   Gastroesophageal reflux disease with esophagitis without hemorrhage Yes   Diverticulitis of colon-recurrent    Acute bacterial sinusitis - resolved    Hepatic steatosis    Splenic artery aneurysm (HCC)    She is doing well overall and we will try to continue to taper her PPI.  We talked about coming down slowly from 20 mg by eventually moving to every other day and perhaps every third day and using H2 blocker and/or antacids as needed.  She is given a prescription of Augmentin to have on hand in case her diverticulitis flares.  Hepatic steatosis and splenic artery aneurysm are being followed by [facility]    Meds ordered this encounter  Medications   amoxicillin-clavulanate (AUGMENTIN) 875-125 MG tablet    Sig: Take 1 tablet by mouth 2 (two) times daily.    Dispense:  14 tablet    Refill:  0   ondansetron (ZOFRAN-ODT) 4 MG disintegrating tablet    Sig: Take 1 tablet (4 mg total) by mouth every 8 (eight) hours as needed for nausea or vomiting.    Dispense:  30 tablet    Refill:  1   After the visit I have realized she is overdue for repeat routine colonoscopy and we will contact her to schedule that if she is so inclined.     Subjective:   Chief Complaint: GERD/diverticulitis history-follow-up  HPI Amy Phillips is a 67 year old retired Engineer, civil (consulting) with a history of hepatic steatosis and a splenic artery aneurysm and prior imaging raising the question of portal hypertension, who also has a history of GERD symptoms and recurrent diverticulitis.  I last saw her in September 2023.  She has been seen at the [facility] in [place] in the past several months.  It is thought that she has thin body habitus variant hepatic steatosis.  Liver Doppler in December 2023 with echogenic liver most consistent with steatosis and no evidence of portal hypertension.  CT angio  abdomen with stability of a 1.5 x 1.5 cm partially calcified splenic artery aneurysm, collateral vascular vessels in the left retroperitoneum draining into the IVC near the IMV, 1.4 cm right kidney lesion which is growing (MRI pending per PCP), liver cyst and mild hepatic steatosis.  She reports she has been weaning PPI and is down to 20 mg Nexium.  There is no dysphagia or heartburn symptomatology.  She is asking about how to wean further.  She has been on 20 mg daily for about 2 weeks now.  She has a trip to [place] coming up soon and would like to have an Augmentin prescription on hand she had to use her previous one that was on hand for sinusitis.  She has a history of recurrent diverticulitis.  She has been a little fatigued.  She is intentionally lost about 5 pounds.  She is taking B12 supplementation on her own discretion due to potential for B12 deficiency while on PPI.  She would like to have some Zofran on hand because her GERD symptoms tend to be nausea.  Needs a refill.  Last EGD 2013 with grade a reflux esophagitis (Dr. Loreta Ave).  Last colonoscopy 2013 with diverticulosis. Allergies  Allergen Reactions   Peanuts [Peanut Oil] Other (See Comments)    Mood changes per pt and husband    Shellfish Allergy Other (See Comments)    Throat irritation   Strawberry (Diagnostic)  Current Meds  Medication Sig   albuterol (VENTOLIN HFA) 108 (90 Base) MCG/ACT inhaler Inhale 2 puffs into the lungs every 6 (six) hours as needed for wheezing or shortness of breath.   ALPRAZolam (XANAX) 0.25 MG tablet Take 1 tablet (0.25 mg total) by mouth daily as needed for anxiety or sleep.   Azelaic Acid 15 % gel 1 application to affected area   beclomethasone (QVAR REDIHALER) 80 MCG/ACT inhaler Inhale 2 puffs into the lungs 2 (two) times daily.   buPROPion (WELLBUTRIN XL) 150 MG 24 hr tablet Take 1 tablet (150 mg total) by mouth daily.   cetirizine (ZYRTEC) 10 MG tablet Take 10 mg by mouth daily.   docusate (COLACE)  50 MG/5ML liquid Take by mouth daily.   doxycycline (VIBRAMYCIN) 100 MG capsule Take 100 mg by mouth as needed.   esomeprazole (NEXIUM) 20 MG capsule Take 20 mg by mouth daily at 12 noon.   fluorouracil (EFUDEX) 5 % cream Apply topically 2 (two) times daily.   fluticasone (FLONASE) 50 MCG/ACT nasal spray Place 1-2 sprays into both nostrils daily.   Magnesium 500 MG TABS Take 500 mg by mouth daily.   ondansetron (ZOFRAN-ODT) 4 MG disintegrating tablet Take 1 tablet (4 mg total) by mouth every 8 (eight) hours as needed for nausea or vomiting.   tretinoin (RETIN-A) 0.025 % cream 1 application to affected area in the evening to face   valACYclovir (VALTREX) 1000 MG tablet Take 1 tablet (1,000 mg total) by mouth daily. For 5 days at first sign of outbreak   [DISCONTINUED] esomeprazole (NEXIUM) 40 MG capsule Take 1 capsule (40 mg total) by mouth daily before a meal   [DISCONTINUED] ondansetron (ZOFRAN) 4 MG tablet Take 1 tablet (4 mg total) by mouth every 8 (eight) hours as needed for nausea or vomiting.   Past Medical History:  Diagnosis Date   Allergy    Anxiety    Arrhythmia    PVCs- no meds   Asthma    Basal cell carcinoma of nose 08/19/2017   COVID    Diverticulitis 08/05/2012   Dr Loreta Ave   GERD (gastroesophageal reflux disease)    Lesion of right native kidney    Ovarian cyst    recurrent   PVC's (premature ventricular contractions)    Uterine fibroid 2006   16 week size   Past Surgical History:  Procedure Laterality Date   ABDOMINAL HYSTERECTOMY N/A 06/12/2015   Procedure: HYSTERECTOMY ABDOMINAL With Vertical Incision;  Surgeon: Hal Morales, MD;   BILATERAL SALPINGECTOMY  06/12/2015   Procedure: BILATERAL SALPINGECTOMY;  Surgeon: Hal Morales, MD;  Location: [facility];  Service: Gynecology;;   COLONOSCOPY     DILATATION & CURETTAGE/HYSTEROSCOPY WITH TRUECLEAR N/A 09/08/2013   Procedure: DILATATION & CURETTAGE/HYSTEROSCOPY WITH TRUCLEAR;  Surgeon: Bennye Alm, MD;   Location: [facility];  Service: Gynecology;  Laterality: N/A;   DILATION AND CURETTAGE OF UTERUS     D&E x2 missed ab   ESOPHAGOGASTRODUODENOSCOPY     IR RADIOLOGIST EVAL & MGMT  12/19/2020   UPPER GI ENDOSCOPY  07/23/12   grade 1 esophagitis noted:  otherwise normal esophagogastrduodenoscopy   Social History   Social History Narrative   Married, 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   Review of Systems As per HPI  Objective:   Physical Exam BP 118/68   Pulse 83   Ht 5\' 2"  (1.575 m)   Wt 127 lb (57.6 kg)   LMP  (LMP Unknown)   BMI 23.23 kg/m   Data reviewed see HPI but I have reviewed recent imaging, also liver clinic notes from 2024 31 minutes total time on this visit

## 2023-06-10 NOTE — Patient Instructions (Signed)
_______________________________________________________  If your blood pressure at your visit was 140/90 or greater, please contact your primary care physician to follow up on this.  _______________________________________________________  If you are age 66 or older, your body mass index should be between 23-30. Your Body mass index is 23.23 kg/m. If this is out of the aforementioned range listed, please consider follow up with your Primary Care Provider.  If you are age 62 or younger, your body mass index should be between 19-25. Your Body mass index is 23.23 kg/m. If this is out of the aformentioned range listed, please consider follow up with your Primary Care Provider.   ________________________________________________________  The [facility] providers would like to encourage you to use [facility] to communicate with providers for non-urgent requests or questions.  Due to long hold times on the telephone, sending your provider a message by [facility] may be a faster and more efficient way to get a response.  Please allow 48 business hours for a response.  Please remember that this is for non-urgent requests.  _______________________________________________________  Taper Nexium as discussed with the doctor  We have sent the following medications to your pharmacy for you to pick up at your convenience: Augmentin- To have on hand Zofran as needed  Follow up as needed  It was a pleasure to see you today!  Thank you for trusting me with your gastrointestinal care!

## 2023-06-10 NOTE — Telephone Encounter (Signed)
Please contact the patient and tell her I realized after she left she is overdue for a colonoscopy (last was in 2013).  I am recommending she have a screening colonoscopy at her convenience unless she has done a Cologuard or Hemoccult test in the interim.

## 2023-06-11 ENCOUNTER — Other Ambulatory Visit: Payer: Self-pay

## 2023-06-11 DIAGNOSIS — Z1211 Encounter for screening for malignant neoplasm of colon: Secondary | ICD-10-CM

## 2023-06-11 NOTE — Telephone Encounter (Signed)
She can do a cologuard

## 2023-06-11 NOTE — Telephone Encounter (Signed)
Left message for patient to call back  

## 2023-06-11 NOTE — Telephone Encounter (Signed)
Left message for patient to call back. Ordered cologuard.

## 2023-06-11 NOTE — Telephone Encounter (Signed)
Discussed MD recommendations with patient & she would like to know if a cologuard would be an option for her? If not, she will move forward with scheduling colon at the earlier part of next year d/t several medical expenses.

## 2023-06-12 NOTE — Telephone Encounter (Signed)
Spoke with patient & she has already received an email from [facility] for cologuard.

## 2023-07-04 LAB — COLOGUARD: COLOGUARD: POSITIVE — AB

## 2023-07-06 ENCOUNTER — Other Ambulatory Visit: Payer: Self-pay

## 2023-07-06 ENCOUNTER — Other Ambulatory Visit: Payer: Medicare Other

## 2023-07-06 DIAGNOSIS — R195 Other fecal abnormalities: Secondary | ICD-10-CM

## 2023-07-06 DIAGNOSIS — Z1211 Encounter for screening for malignant neoplasm of colon: Secondary | ICD-10-CM

## 2023-08-06 ENCOUNTER — Encounter: Payer: Self-pay | Admitting: Internal Medicine

## 2023-08-06 NOTE — Progress Notes (Signed)
[facility] History and Physical   Primary Care Physician:  Shade Flood, MD   Reason for Procedure:  Positive Cologuard  Plan:    Colonoscopy     HPI: Amy Phillips is a 67 y.o. female here for colonoscopy to evaluate an abnormal Cologuard test.   Past Medical History:  Diagnosis Date   Allergy    Anxiety    Arrhythmia    PVCs- no meds   Asthma    Basal cell carcinoma of nose 08/19/2017   COVID    Diverticulitis 08/05/2012   Dr Loreta Ave   GERD (gastroesophageal reflux disease)    Hyperlipidemia    Lesion of right native kidney    Ovarian cyst    recurrent   PVC's (premature ventricular contractions)    Uterine fibroid 2006   16 week size    Past Surgical History:  Procedure Laterality Date   ABDOMINAL HYSTERECTOMY N/A 06/12/2015   Procedure: HYSTERECTOMY ABDOMINAL With Vertical Incision;  Surgeon: Hal Morales, MD;   BILATERAL SALPINGECTOMY  06/12/2015   Procedure: BILATERAL SALPINGECTOMY;  Surgeon: Hal Morales, MD;  Location: [facility];  Service: Gynecology;;   COLONOSCOPY     DILATATION & CURETTAGE/HYSTEROSCOPY WITH TRUECLEAR N/A 09/08/2013   Procedure: DILATATION & CURETTAGE/HYSTEROSCOPY WITH TRUCLEAR;  Surgeon: Bennye Alm, MD;  Location: [facility];  Service: Gynecology;  Laterality: N/A;   DILATION AND CURETTAGE OF UTERUS     D&E x2 missed ab   ESOPHAGOGASTRODUODENOSCOPY     IR RADIOLOGIST EVAL & MGMT  12/19/2020   UPPER GI ENDOSCOPY  07/23/12   grade 1 esophagitis noted:  otherwise normal esophagogastrduodenoscopy    Prior to Admission medications   Medication Sig Start Date End Date Taking? Authorizing Provider  albuterol (VENTOLIN HFA) 108 (90 Base) MCG/ACT inhaler Inhale 2 puffs into the lungs every 6 (six) hours as needed for wheezing or shortness of breath. 05/27/19   Janeece Agee, NP  ALPRAZolam Prudy Feeler) 0.25 MG tablet Take 1 tablet (0.25 mg total) by mouth daily as needed for anxiety or sleep. 04/06/23   Shade Flood, MD   amoxicillin-clavulanate (AUGMENTIN) 875-125 MG tablet Take 1 tablet by mouth 2 (two) times daily. 06/10/23   Iva Boop, MD  Azelaic Acid 15 % gel 1 application to affected area    [provider]  beclomethasone (QVAR REDIHALER) 80 MCG/ACT inhaler Inhale 2 puffs into the lungs 2 (two) times daily. 07/04/21   Shade Flood, MD  buPROPion (WELLBUTRIN XL) 150 MG 24 hr tablet Take 1 tablet (150 mg total) by mouth daily. 04/06/23   Shade Flood, MD  cetirizine (ZYRTEC) 10 MG tablet Take 10 mg by mouth daily.    [provider]  docusate (COLACE) 50 MG/5ML liquid Take by mouth daily.    [provider]  doxycycline (VIBRAMYCIN) 100 MG capsule Take 100 mg by mouth as needed.    [provider]  esomeprazole (NEXIUM) 20 MG capsule Take 20 mg by mouth daily at 12 noon.    [provider]  fluorouracil (EFUDEX) 5 % cream Apply topically 2 (two) times daily.    [provider]  fluticasone (FLONASE) 50 MCG/ACT nasal spray Place 1-2 sprays into both nostrils daily. 02/11/22     Magnesium 500 MG TABS Take 500 mg by mouth daily.    [provider]  metroNIDAZOLE (METROCREAM) 0.75 % cream Apply 1 application. topically 2 (two) times daily. Patient not taking: Reported on 06/10/2023 11/04/21  [provider]  ondansetron (ZOFRAN-ODT) 4 MG disintegrating tablet Take 1 tablet (4 mg total) by mouth every 8 (eight) hours as needed for nausea or vomiting. 06/10/23   Iva Boop, MD  tretinoin (RETIN-A) 0.025 % cream 1 application to affected area in the evening to face 08/16/19   [provider]  valACYclovir (VALTREX) 1000 MG tablet Take 1 tablet (1,000 mg total) by mouth daily. For 5 days at first sign of outbreak 03/11/16   Sherren Mocha, MD  fluticasone [facility] HFA) 110 MCG/ACT inhaler INHALE 2 PUFFS 2 TIMES DAILY 10/11/20 02/12/21  Sidney Ace, MD    Current Outpatient Medications  Medication Sig Dispense Refill    beclomethasone (QVAR REDIHALER) 80 MCG/ACT inhaler Inhale 2 puffs into the lungs 2 (two) times daily. 1 each 5   buPROPion (WELLBUTRIN XL) 150 MG 24 hr tablet Take 1 tablet (150 mg total) by mouth daily. 90 tablet 2   cetirizine (ZYRTEC) 10 MG tablet Take 10 mg by mouth daily.     docusate (COLACE) 50 MG/5ML liquid Take by mouth daily.     esomeprazole (NEXIUM) 20 MG capsule Take 20 mg by mouth daily at 12 noon.     fluticasone (FLONASE) 50 MCG/ACT nasal spray Place 1-2 sprays into both nostrils daily. 16 g 6   Magnesium 500 MG TABS Take 500 mg by mouth daily.     ondansetron (ZOFRAN-ODT) 4 MG disintegrating tablet Take 1 tablet (4 mg total) by mouth every 8 (eight) hours as needed for nausea or vomiting. 30 tablet 1   albuterol (VENTOLIN HFA) 108 (90 Base) MCG/ACT inhaler Inhale 2 puffs into the lungs every 6 (six) hours as needed for wheezing or shortness of breath. 18 g 2   ALPRAZolam (XANAX) 0.25 MG tablet Take 1 tablet (0.25 mg total) by mouth daily as needed for anxiety or sleep. 30 tablet 0   amoxicillin-clavulanate (AUGMENTIN) 875-125 MG tablet Take 1 tablet by mouth 2 (two) times daily. 14 tablet 0   Azelaic Acid 15 % gel 1 application to affected area     doxycycline (VIBRAMYCIN) 100 MG capsule Take 100 mg by mouth as needed.     fluorouracil (EFUDEX) 5 % cream Apply topically 2 (two) times daily.     metroNIDAZOLE (METROCREAM) 0.75 % cream Apply 1 application. topically 2 (two) times daily. (Patient not taking: Reported on 06/10/2023)     tretinoin (RETIN-A) 0.025 % cream 1 application to affected area in the evening to face     valACYclovir (VALTREX) 1000 MG tablet Take 1 tablet (1,000 mg total) by mouth daily. For 5 days at first sign of outbreak 30 tablet 5   Current Facility-Administered Medications  Medication Dose Route Frequency Provider Last Rate Last Admin   0.9 %  sodium chloride infusion  500 mL Intravenous Once Iva Boop, MD        Allergies as of 08/07/2023 -  Review Complete 08/07/2023  Allergen Reaction Noted   Peanuts [peanut oil] Other (See Comments) 06/12/2015   Shellfish allergy Other (See Comments) 06/12/2015   Strawberry (diagnostic)  09/20/2015    Family History  Problem Relation Age of Onset   Arthritis Mother    Osteoporosis Mother    Breast cancer Mother    COPD Mother    Lung cancer Mother    COPD Father    Heart disease Father    Lung cancer Father    Hypertension Sister    Heart disease Sister  Arthritis Sister    Hypertension Brother    Colon cancer Neg Hx    Esophageal cancer Neg Hx    Pancreatic cancer Neg Hx    Liver disease Neg Hx    Stomach cancer Neg Hx    Rectal cancer Neg Hx     Social History   Socioeconomic History   Marital status: Married    Spouse name: Not on file   Number of children: 2   Years of education: Not on file   Highest education level: Not on file  Occupational History   Not on file  Tobacco Use   Smoking status: Never   Smokeless tobacco: Never  Vaping Use   Vaping status: Never Used  Substance and Sexual Activity   Alcohol use: Not Currently    Comment: rare   Drug use: No   Sexual activity: Yes    Partners: Male    Birth control/protection: Surgical, Post-menopausal    Comment: hysterectomy  Other Topics Concern   Not on file  Social History Narrative   Married, 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   1 son born in 59   1 daughter born in [birth year]   Never smoker no alcohol no caffeine no drug use   Social Determinants of Corporate investment banker Strain: Not on file  Food Insecurity: Low Risk  (08/27/2022)   [facility]   Hunger Vital Sign    Worried About Running Out of Food in the Last Year: Never true    Ran Out of Food in the Last Year: Never true  Transportation Needs: Not on file (08/27/2022)  Physical Activity: Not on file  Stress: Not on file  Social Connections: Not on file   Intimate Partner Violence: Not on file    Review of Systems:  All other review of systems negative except as mentioned in the HPI.  Physical Exam: Vital signs BP 138/81   Pulse 81   Temp 98.2 F (36.8 C) (Temporal)   Ht 5\' 2"  (1.575 m)   Wt 127 lb (57.6 kg)   LMP  (LMP Unknown)   SpO2 97%   BMI 23.23 kg/m   General:   Alert,  Well-developed, well-nourished, pleasant and cooperative in NAD Lungs:  Clear throughout to auscultation.   Heart:  Regular rate and rhythm; no murmurs, clicks, rubs,  or gallops. Abdomen:  Soft, nontender and nondistended. Normal bowel sounds.   Neuro/Psych:  Alert and cooperative. Normal mood and affect. A and O x 3   @Huxley Vanwagoner  Sena Slate, MD, [facility] [phone] (pager) 08/07/2023 1:45 PM@

## 2023-08-07 ENCOUNTER — Encounter: Payer: Self-pay | Admitting: Internal Medicine

## 2023-08-07 ENCOUNTER — Ambulatory Visit: Payer: Medicare Other | Admitting: Internal Medicine

## 2023-08-07 VITALS — BP 127/76 | HR 71 | Temp 98.2°F | Resp 12 | Ht 62.0 in | Wt 127.0 lb

## 2023-08-07 DIAGNOSIS — D122 Benign neoplasm of ascending colon: Secondary | ICD-10-CM

## 2023-08-07 DIAGNOSIS — D123 Benign neoplasm of transverse colon: Secondary | ICD-10-CM

## 2023-08-07 DIAGNOSIS — Z1211 Encounter for screening for malignant neoplasm of colon: Secondary | ICD-10-CM | POA: Diagnosis not present

## 2023-08-07 DIAGNOSIS — R195 Other fecal abnormalities: Secondary | ICD-10-CM | POA: Diagnosis not present

## 2023-08-07 MED ORDER — SODIUM CHLORIDE 0.9 % IV SOLN: 500.0000 mL | Freq: Once | INTRAVENOUS | Status: DC

## 2023-08-07 NOTE — Progress Notes (Signed)
Called to room to assist during endoscopic procedure.  Patient ID and intended procedure confirmed with present staff. Received instructions for my participation in the procedure from the performing physician.  

## 2023-08-07 NOTE — Op Note (Signed)
[facility] Patient Name: Amy Phillips Procedure Date: 08/07/2023 1:54 PM MRN: [id number] Endoscopist: Iva Boop , MD, [id number] Age: 67 Referring MD:  Date of Birth: [birth year] Gender: Female Account #: [id number] Procedure:                Colonoscopy Indications:              Positive Cologuard test Medicines:                Monitored Anesthesia Care Procedure:                Pre-Anesthesia Assessment:                           - Prior to the procedure, a History and Physical                            was performed, and patient medications and                            allergies were reviewed. The patient's tolerance of                            previous anesthesia was also reviewed. The risks                            and benefits of the procedure and the sedation                            options and risks were discussed with the patient.                            All questions were answered, and informed consent                            was obtained. Prior Anticoagulants: The patient has                            taken no anticoagulant or antiplatelet agents. ASA                            Grade Assessment: II - A patient with mild systemic                            disease. After reviewing the risks and benefits,                            the patient was deemed in satisfactory condition to                            undergo the procedure.                           After obtaining informed consent, the colonoscope  was passed under direct vision. Throughout the                            procedure, the patient's blood pressure, pulse, and                            oxygen saturations were monitored continuously. The                            Olympus Scope SN: [id number] was introduced through                            the anus and advanced to the the cecum, identified                            by appendiceal orifice and ileocecal  valve. The                            patient tolerated the procedure well. The                            colonoscopy was somewhat difficult due to a                            tortuous colon. Successful completion of the                            procedure was aided by straightening and shortening                            the scope to obtain bowel loop reduction. Scope In: 1:59:42 PM Scope Out: 2:21:54 PM Scope Withdrawal Time: 0 hours 15 minutes 50 seconds  Total Procedure Duration: 0 hours 22 minutes 12 seconds  Findings:                 The perianal and digital rectal examinations were                            normal.                           Four sessile and semi-pedunculated polyps were                            found in the transverse colon and ascending colon.                            The polyps were 2 to 8 mm in size. These polyps                            were removed with a cold snare. Resection and                            retrieval were complete. Verification of patient  identification for the specimen was done. Estimated                            blood loss was minimal.                           Multiple diverticula were found in the entire colon.                           The exam was otherwise without abnormality on                            direct and retroflexion views. Complications:            No immediate complications. Estimated Blood Loss:     Estimated blood loss was minimal. Impression:               - Four 2 to 8 mm polyps in the transverse colon and                            in the ascending colon, removed with a cold snare.                            Resected and retrieved.                           - Diverticulosis in the entire examined colon.                            Sigmoid most involved w/ tortuous colon there.                            Passed using water infusion.                           - The examination  was otherwise normal on direct                            and retroflexion views - though did have slightly                            prominent hemorrhoids also. Recommendation:           - Patient has a contact number available for                            emergencies. The signs and symptoms of potential                            delayed complications were discussed with the                            patient. Return to normal activities tomorrow.  Written discharge instructions were provided to the                            patient.                           - Resume previous diet.                           - Continue present medications.                           - Await pathology results.                           - Repeat colonoscopy is recommended. The                            colonoscopy date will be determined after pathology                            results from today's exam become available for                            review. Iva Boop, MD 08/07/2023 2:33:39 PM This report has been signed electronically.

## 2023-08-07 NOTE — Patient Instructions (Addendum)
Four small polyps removed - all look benign.  Saw your diverticulosis also.  I will let you know pathology results and when to have another routine colonoscopy by mail and/or My Chart.  I appreciate the opportunity to care for you. Iva Boop, MD, FACG    YOU HAD AN ENDOSCOPIC PROCEDURE TODAY AT THE [facility]:   Refer to the procedure report that was given to you for any specific questions about what was found during the examination.  If the procedure report does not answer your questions, please call your gastroenterologist to clarify.  If you requested that your care partner not be given the details of your procedure findings, then the procedure report has been included in a sealed envelope for you to review at your convenience later.  YOU SHOULD EXPECT: Some feelings of bloating in the abdomen. Passage of more gas than usual.  Walking can help get rid of the air that was put into your GI tract during the procedure and reduce the bloating. If you had a lower endoscopy (such as a colonoscopy or flexible sigmoidoscopy) you may notice spotting of blood in your stool or on the toilet paper. If you underwent a bowel prep for your procedure, you may not have a normal bowel movement for a few days.  Please Note:  You might notice some irritation and congestion in your nose or some drainage.  This is from the oxygen used during your procedure.  There is no need for concern and it should clear up in a day or so.  SYMPTOMS TO REPORT IMMEDIATELY:  Following lower endoscopy (colonoscopy or flexible sigmoidoscopy):  Excessive amounts of blood in the stool  Significant tenderness or worsening of abdominal pains  Swelling of the abdomen that is new, acute  Fever of 100F or higher  For urgent or emergent issues, a gastroenterologist can be reached at any hour by calling [phone]. Do not use MyChart messaging for urgent concerns.    DIET:  We do recommend a small meal at  first, but then you may proceed to your regular diet.  Drink plenty of fluids but you should avoid alcoholic beverages for 24 hours.  ACTIVITY:  You should plan to take it easy for the rest of today and you should NOT DRIVE or use heavy machinery until tomorrow (because of the sedation medicines used during the test).    FOLLOW UP: Our staff will call the number listed on your records the next business day following your procedure.  We will call around 7:15- 8:00 am to check on you and address any questions or concerns that you may have regarding the information given to you following your procedure. If we do not reach you, we will leave a message.     If any biopsies were taken you will be contacted by phone or by letter within the next 1-3 weeks.  Please call us at [phone] if you have not heard about the biopsies in 3 weeks.    SIGNATURES/CONFIDENTIALITY: You and/or your care partner have signed paperwork which will be entered into your electronic medical record.  These signatures attest to the fact that that the information above on your After Visit Summary has been reviewed and is understood.  Full responsibility of the confidentiality of this discharge information lies with you and/or your care-partner.

## 2023-08-07 NOTE — Progress Notes (Signed)
Vss nad trans to pacu 

## 2023-08-10 ENCOUNTER — Telehealth: Payer: Self-pay

## 2023-08-10 NOTE — Telephone Encounter (Signed)
  Follow up Call-     08/07/2023    1:31 PM  Call back number  Post procedure Call Back phone  # [phone]  Permission to leave phone message Yes     Patient questions:  Do you have a fever, pain , or abdominal swelling? No. Pain Score  0 *  Have you tolerated food without any problems? Yes.    Have you been able to return to your normal activities? Yes.    Do you have any questions about your discharge instructions: Diet   No. Medications  No. Follow up visit  No.  Do you have questions or concerns about your Care? No.  Actions: * If pain score is 4 or above: No action needed, pain <4.

## 2023-08-12 LAB — SURGICAL PATHOLOGY

## 2023-08-14 ENCOUNTER — Encounter: Payer: Self-pay | Admitting: Internal Medicine

## 2023-08-14 DIAGNOSIS — Z860101 Personal history of adenomatous and serrated colon polyps: Secondary | ICD-10-CM | POA: Insufficient documentation

## 2023-08-14 HISTORY — DX: Personal history of adenomatous and serrated colon polyps: Z86.0101

## 2023-09-01 LAB — HM MAMMOGRAPHY

## 2023-09-02 ENCOUNTER — Ambulatory Visit
Admission: RE | Admit: 2023-09-02 | Discharge: 2023-09-02 | Disposition: A | Discharge status: Home / Self Care | Payer: Medicare Other | Source: Ambulatory Visit | Attending: Family Medicine | Admitting: Family Medicine

## 2023-09-02 DIAGNOSIS — N2889 Other specified disorders of kidney and ureter: Secondary | ICD-10-CM

## 2023-09-02 MED ORDER — GADOPICLENOL 0.5 MMOL/ML IV SOLN
6.0000 mL | Freq: Once | INTRAVENOUS | Status: AC | PRN
  Administered 2023-09-02: 6 mL via INTRAVENOUS

## 2023-10-02 ENCOUNTER — Telehealth: Payer: Medicare Other | Admitting: Emergency Medicine

## 2023-10-02 DIAGNOSIS — J189 Pneumonia, unspecified organism: Secondary | ICD-10-CM

## 2023-10-02 MED ORDER — DOXYCYCLINE HYCLATE 100 MG PO TABS: 100.0000 mg | ORAL_TABLET | Freq: Two times a day (BID) | ORAL | 0 refills | Status: AC

## 2023-10-02 MED ORDER — PREDNISONE 10 MG (21) PO TBPK: ORAL_TABLET | ORAL | 0 refills | Status: DC

## 2023-10-02 NOTE — Progress Notes (Signed)
 E-Visit for Cough   We are sorry that you are not feeling well.  Here is how we plan to help!  If the treatment plan outlined below does not help you get better, please be seen in person  Based on your presentation I believe you most likely have A cough due to bacteria.  When patients have a fever and a productive cough with a change in color or increased sputum production, we are concerned about bacterial bronchitis.  If left untreated it can progress to pneumonia.  If your symptoms do not improve with your treatment plan it is important that you contact your provider.   I have prescribed Doxycycline  100 mg twice a day for 7 days     Continue using the medicines you are taking to support yourself during this illness (guiafenesin, etc) and continue using your neti pot.   I have also prescribed prednisone  as requested.   From your responses in the eVisit questionnaire you describe inflammation in the upper respiratory tract which is causing a significant cough.  This is commonly called Bronchitis and has four common causes:   Allergies Viral Infections Acid Reflux Bacterial Infection Allergies, viruses and acid reflux are treated by controlling symptoms or eliminating the cause. An example might be a cough caused by taking certain blood pressure medications. You stop the cough by changing the medication. Another example might be a cough caused by acid reflux. Controlling the reflux helps control the cough.  USE OF BRONCHODILATOR (RESCUE) INHALERS: There is a risk from using your bronchodilator too frequently.  The risk is that over-reliance on a medication which only relaxes the muscles surrounding the breathing tubes can reduce the effectiveness of medications prescribed to reduce swelling and congestion of the tubes themselves.  Although you feel brief relief from the bronchodilator inhaler, your asthma may actually be worsening with the tubes becoming more swollen and filled with mucus.   This can delay other crucial treatments, such as oral steroid medications. If you need to use a bronchodilator inhaler daily, several times per day, you should discuss this with your provider.  There are probably better treatments that could be used to keep your asthma under control.     HOME CARE Only take medications as instructed by your medical team. Complete the entire course of an antibiotic. Drink plenty of fluids and get plenty of rest. Avoid close contacts especially the very young and the elderly Cover your mouth if you cough or cough into your sleeve. Always remember to wash your hands A steam or ultrasonic humidifier can help congestion.   GET HELP RIGHT AWAY IF: You develop worsening fever. You become short of breath You cough up blood. Your symptoms persist after you have completed your treatment plan MAKE SURE YOU  Understand these instructions. Will watch your condition. Will get help right away if you are not doing well or get worse.    Thank you for choosing an e-visit.  Your e-visit answers were reviewed by a board certified advanced clinical practitioner to complete your personal care plan. Depending upon the condition, your plan could have included both over the counter or prescription medications.  Please review your pharmacy choice. Make sure the pharmacy is open so you can pick up prescription now. If there is a problem, you may contact your provider through [employer] and have the prescription routed to another pharmacy.  Your safety is important to us . If you have drug allergies check your prescription carefully.  For the next 24 hours you can use MyChart to ask questions about today's visit, request a non-urgent call back, or ask for a work or school excuse. You will get an email in the next two days asking about your experience. I hope that your e-visit has been valuable and will speed your recovery.  I have spent 5 minutes in review of e-visit  questionnaire, review and updating patient chart, medical decision making and response to patient.   Jon Belt, PhD, FNP-BC

## 2023-10-05 ENCOUNTER — Ambulatory Visit (INDEPENDENT_AMBULATORY_CARE_PROVIDER_SITE_OTHER)
Admission: RE | Admit: 2023-10-05 | Discharge: 2023-10-05 | Disposition: A | Discharge status: Home / Self Care | Payer: Medicare Other | Source: Ambulatory Visit | Attending: Family Medicine | Admitting: Family Medicine

## 2023-10-05 ENCOUNTER — Ambulatory Visit: Payer: Medicare Other | Admitting: Family Medicine

## 2023-10-05 VITALS — BP 122/66 | HR 90 | Temp 99.9°F | Ht 62.0 in | Wt 130.8 lb

## 2023-10-05 DIAGNOSIS — R052 Subacute cough: Secondary | ICD-10-CM

## 2023-10-05 DIAGNOSIS — Z8619 Personal history of other infectious and parasitic diseases: Secondary | ICD-10-CM

## 2023-10-05 DIAGNOSIS — R0789 Other chest pain: Secondary | ICD-10-CM

## 2023-10-05 DIAGNOSIS — E785 Hyperlipidemia, unspecified: Secondary | ICD-10-CM

## 2023-10-05 DIAGNOSIS — J4540 Moderate persistent asthma, uncomplicated: Secondary | ICD-10-CM

## 2023-10-05 DIAGNOSIS — R7303 Prediabetes: Secondary | ICD-10-CM

## 2023-10-05 LAB — POC COVID19 BINAXNOW: SARS Coronavirus 2 Ag: NEGATIVE

## 2023-10-05 MED ORDER — GUAIFENESIN-CODEINE 100-10 MG/5ML PO SOLN: 5.0000 mL | Freq: Three times a day (TID) | ORAL | 0 refills | Status: DC | PRN

## 2023-10-05 MED ORDER — FLUCONAZOLE 150 MG PO TABS: 150.0000 mg | ORAL_TABLET | Freq: Once | ORAL | 0 refills | Status: AC

## 2023-10-05 MED ORDER — PREDNISONE 20 MG PO TABS: ORAL_TABLET | ORAL | 0 refills | Status: DC

## 2023-10-05 NOTE — Progress Notes (Signed)
 Subjective:  Patient ID: Amy Phillips, female    DOB: 1955-11-13  Age: 68 y.o. MRN: [id number]  CC:  Chief Complaint  Patient presents with   Sore Throat    1/1 started with a sore throat, got a congestion in her chest, tried OTC sudafed Qvar  and Flonase , has been using nebulizer treatment, netty pot, saline spray, denied body aches, cannot sleep at night due to cough and worst in general at night, did an Evisit Friday, Doxycycline  and prednisone  was prescribed, sinuses seem to have cleared but doesn't feel like her chest sxs are not clearing at all     HPI Amy Phillips presents for   Sore throat, cough Started about 13 days ago - sore throat, scratchy throat. Spouse with similar sx's and he lost his voice. No other sick contacts.  Nasal congestion. Some shortness of breath. Had to use rescue inhaler frequently - minimal improvement. Hx of asthma, has been using Qvar  - no missed doses - chronic use. Has been using netipot and flonase . Discolored nasal, d/c last week. White - green.  Using albuterol  neb past week or so - every 6 hours helpful (MDI/HFA albuterol  was not helpful).  No fever.  Usually dry cough - bronchospasm or tickle in throat. Some productive cough today.  Still some shortness of breath - all the time. No difference with activity. Unknown O2 sat.   E-visit noted from January 10.  Diagnosis of community-acquired pneumonia, treated with doxycycline  and prednisone  - has been taking. Does not think doxy is working d/t persistent cough, and difficulty sleeping. Worse at night. On 30mg  prednisone  this morning, overall better since start of illness. Sinuses better, lungs are the issue still. Did feel better on higher dose prednisone  - less after a few days.   Called allergist today - advised to check covid test, and then call their office.   Took 2 teaspoons of leftover hydrocodone  cough syrup that helped sleep.   Some soreness in R chest under R breast, since last night. Hurts  ot press on rib cage - feels a little deeper. Frequent cough past few weeks.  No heartburn.   History of thrush in past with use of Qvar . Sore throat and hoarse in past with thrush.  No sore throat or difficulty swallowing. Some sinus drainage. No mouth pain.   No recent prolonged car travel or air travel, no recent calf pain or swelling. No recent surgery.   Feels like sx's similar to prior respiratory infections.   History Patient Active Problem List   Diagnosis Date Noted   Hx of adenomatous colonic polyps 08/14/2023   Adverse reaction to food 07/04/2021   Allergic rhinitis 07/04/2021   Allergic rhinitis due to pollen 07/04/2021   Cough 07/04/2021   Fibroid, uterine 06/12/2015   Leiomyoma of uterus, unspecified 12/29/2014   Abnormal uterine bleeding 12/29/2014   Enlarged uterus 07/28/2014   Fibroids 05/24/2013   Post-menopausal bleeding 05/24/2013   Diverticulosis 07/27/2012   Colon polyps 07/27/2012   HYPERLIPIDEMIA-MIXED 10/04/2010   Palpitations 10/04/2010   DYSPNEA 10/04/2010   DYSPNEA ON EXERTION 10/04/2010   Past Medical History:  Diagnosis Date   Allergy    Anxiety    Arrhythmia    PVCs- no meds   Asthma    Basal cell carcinoma of nose 08/19/2017   COVID    Diverticulitis 08/05/2012   Dr Kristie   GERD (gastroesophageal reflux disease)    Hx of adenomatous colonic polyps 08/14/2023   11/24 4  adenomas max 8 mm recall 2029    Hyperlipidemia    Lesion of right native kidney    Ovarian cyst    recurrent   PVC's (premature ventricular contractions)    Uterine fibroid 2006   16 week size   Past Surgical History:  Procedure Laterality Date   ABDOMINAL HYSTERECTOMY N/A 06/12/2015   Procedure: HYSTERECTOMY ABDOMINAL With Vertical Incision;  Surgeon: Shanda SHAUNNA Muscat, MD;   BILATERAL SALPINGECTOMY  06/12/2015   Procedure: BILATERAL SALPINGECTOMY;  Surgeon: Shanda SHAUNNA Muscat, MD;  Location: [facility];  Service: Gynecology;;   COLONOSCOPY     DILATATION &  CURETTAGE/HYSTEROSCOPY WITH TRUECLEAR N/A 09/08/2013   Procedure: DILATATION & CURETTAGE/HYSTEROSCOPY WITH TRUCLEAR;  Surgeon: Randine VEAR Fender, MD;  Location: [facility];  Service: Gynecology;  Laterality: N/A;   DILATION AND CURETTAGE OF UTERUS     D&E x2 missed ab   ESOPHAGOGASTRODUODENOSCOPY     IR RADIOLOGIST EVAL & MGMT  12/19/2020   UPPER GI ENDOSCOPY  07/23/12   grade 1 esophagitis noted:  otherwise normal esophagogastrduodenoscopy   Allergies  Allergen Reactions   Peanuts [Peanut Oil] Other (See Comments)    Mood changes per pt and husband    Shellfish Allergy Other (See Comments)    Throat irritation   Strawberry (Diagnostic)    Prior to Admission medications   Medication Sig Start Date End Date Taking? Authorizing Provider  albuterol  (VENTOLIN  HFA) 108 (90 Base) MCG/ACT inhaler Inhale 2 puffs into the lungs every 6 (six) hours as needed for wheezing or shortness of breath. 05/27/19  Yes Kip Ade, NP  ALPRAZolam  (XANAX ) 0.25 MG tablet Take 1 tablet (0.25 mg total) by mouth daily as needed for anxiety or sleep. 04/06/23  Yes Levora Reyes SAUNDERS, MD  Azelaic Acid 15 % gel 1 application to affected area   Yes [provider]  beclomethasone (QVAR  REDIHALER) 80 MCG/ACT inhaler Inhale 2 puffs into the lungs 2 (two) times daily. 07/04/21  Yes Levora Reyes SAUNDERS, MD  buPROPion  (WELLBUTRIN  XL) 150 MG 24 hr tablet Take 1 tablet (150 mg total) by mouth daily. 04/06/23  Yes Levora Reyes SAUNDERS, MD  cetirizine (ZYRTEC) 10 MG tablet Take 10 mg by mouth daily.   Yes [provider]  doxycycline  (VIBRA -TABS) 100 MG tablet Take 1 tablet (100 mg total) by mouth 2 (two) times daily. 10/02/23  Yes Richad Jon HERO, NP  EPINEPHrine  0.3 mg/0.3 mL IJ SOAJ injection USE AS DIRECTED AS NEEDED FOR SYSTEMIC REACTIONS   Yes [provider]  esomeprazole  (NEXIUM ) 20 MG capsule Take 20 mg by mouth daily at 12 noon.   Yes [provider]  fluorouracil (EFUDEX) 5 % cream Apply  topically 2 (two) times daily.   Yes [provider]  fluticasone  (FLONASE ) 50 MCG/ACT nasal spray Place 1-2 sprays into both nostrils daily. 02/11/22  Yes   Magnesium  500 MG TABS Take 500 mg by mouth daily.   Yes [provider]  ondansetron  (ZOFRAN -ODT) 4 MG disintegrating tablet Take 1 tablet (4 mg total) by mouth every 8 (eight) hours as needed for nausea or vomiting. 06/10/23  Yes Avram Lupita BRAVO, MD  predniSONE  (STERAPRED UNI-PAK 21 TAB) 10 MG (21) TBPK tablet Take 6 tabs on day 1; take 5 tabs day 2; take 4 tabs day 3; take 3 tabs day 4; take 2 tabs day 5; take 1 tab day 6 10/02/23  Yes Kabbe, Angela M, NP  tretinoin (RETIN-A) 0.025 % cream 1 application to affected area in  the evening to face 08/16/19  Yes [provider]  valACYclovir  (VALTREX ) 1000 MG tablet Take 1 tablet (1,000 mg total) by mouth daily. For 5 days at first sign of outbreak 03/11/16  Yes Loreli Elyn SAILOR, MD  metroNIDAZOLE (METROCREAM) 0.75 % cream Apply 1 application. topically 2 (two) times daily. Patient not taking: Reported on 10/05/2023 11/04/21   [provider]  fluticasone  (FLOVENT  HFA) 110 MCG/ACT inhaler INHALE 2 PUFFS 2 TIMES DAILY 10/11/20 02/12/21  Frutoso Luz, MD   Social History   Socioeconomic History   Marital status: Married    Spouse name: Not on file   Number of children: 2   Years of education: Not on file   Highest education level: Not on file  Occupational History   Not on file  Tobacco Use   Smoking status: Never   Smokeless tobacco: Never  Vaping Use   Vaping status: Never Used  Substance and Sexual Activity   Alcohol use: Not Currently    Comment: rare   Drug use: No   Sexual activity: Yes    Partners: Male    Birth control/protection: Surgical, Post-menopausal    Comment: hysterectomy  Other Topics Concern   Not on file  Social History Narrative   Married, 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   Objective:   Vitals:   10/05/23 1404  BP: 122/66  Pulse: 90  Temp: 99.9 F (37.7 C)  TempSrc: Temporal  SpO2: 97%  Weight: 130 lb 12.8 oz (59.3 kg)  Height: 5' 2 (1.575 m)     Physical Exam Vitals reviewed.  Constitutional:      Appearance: Normal appearance. She is well-developed.  HENT:     Head: Normocephalic and atraumatic.     Mouth/Throat:     Comments: Faint white coating on tongue only, no involvement of buccal mucosa or posterior oropharynx, visualized portion of throat with moist oral mucosa, no discharge, vesicles or white coating. Eyes:     Conjunctiva/sclera: Conjunctivae normal.     Pupils: Pupils are equal, round, and reactive to light.  Neck:     Vascular: No carotid bruit.  Cardiovascular:     Rate and Rhythm: Normal rate and regular rhythm.     Heart sounds: Normal heart sounds.  Pulmonary:     Effort: Pulmonary effort is normal.     Breath sounds: No stridor. Rhonchi (Few very faint coarse breath sounds at right base, clears with cough.  Speaking in full sentences without respiratory distress, cough some throughout office visit.) present. No wheezing or rales.  Abdominal:     Palpations: Abdomen is soft. There is no pulsatile mass.     Tenderness: There is no abdominal tenderness.  Musculoskeletal:  Right lower leg: No edema.     Left lower leg: No edema.  Skin:     General: Skin is warm and dry.  Neurological:     Mental Status: She is alert and oriented to person, place, and time.  Psychiatric:        Mood and Affect: Mood normal.        Behavior: Behavior normal.      56 minutes spent during visit, including chart review, review of previous treatment plan for possible community-acquired pneumonia, counseling and assimilation of information, exam, discussion of plan, and chart completion.    Assessment & Plan:  EMY ANGEVINE is a 68 y.o. female . Subacute cough - Plan: POC COVID-19 BinaxNow, CBC, DG Chest 2 View Moderate persistent asthma without complication - Plan: CBC, DG Chest 2 View, predniSONE  (DELTASONE ) 20 MG tablet Right-sided chest wall pain - Plan: CBC, DG Chest 2 View  -Suspected viral illness, with some flare of asthma.  Reassuring chest x-ray.  Chest wall pain likely due to persistent cough.  Mild leukocytosis on CBC, could be prednisone  effect, and without infiltrate on chest x-ray, no change in regimen at present as status post doxycycline .  Continue symptomatic care discussed with RTC/ER precautions given.  Codeine  cough syrup if needed and recheck in the next approximately 1 week.  History of thrush - Plan: fluconazole  (DIFLUCAN ) 150 MG tablet  -Possible slight white appearance to tongue, early thrush possible, Diflucan  150 g x 1 with RTC precautions given.  Prediabetes - Plan: Hemoglobin A1c  -A1c obtained, continue to watch diet, activity/exercise.  Hyperlipidemia, unspecified hyperlipidemia type - Plan: Comprehensive metabolic panel, Lipid panel  -Monitoring labs obtained.  No med changes at present  Meds ordered this encounter  Medications   fluconazole  (DIFLUCAN ) 150 MG tablet    Sig: Take 1 tablet (150 mg total) by mouth once for 1 dose.    Dispense:  1 tablet    Refill:  0   predniSONE  (DELTASONE ) 20 MG tablet    Sig: 3 by mouth for 3 days, then 2 by mouth for 2 days, then 1 by mouth for 2 days, then 1/2 by mouth for  2 days.    Dispense:  16 tablet    Refill:  0   guaiFENesin -codeine  100-10 MG/5ML syrup    Sig: Take 5-10 mLs by mouth 3 (three) times daily as needed for cough.    Dispense:  120 mL    Refill:  0   Patient Instructions  I am sorry you are not feeling significantly better at this point.  As we discussed, your symptoms could be from asthma or irritation of the upper airway with persistent cough but with the frequent need for albuterol  nebulizer I think returning to higher dose of prednisone  temporarily would be reasonable.  I sent in a new prescription to start at 60 mg 3 times per day, and then taper from there.  Right-sided chest wall soreness likely due to chest wall pain or muscular pain as you are able to somewhat reproduce that area but I will look at the chest x-ray as well.  Please have that performed at the [facility] location today if possible.  Once I see the results I will let you know.  If there is an infiltrate, I think addition of antibiotic may be necessary but not quite yet.  There is a slight white coating of your tongue, so I did write Diflucan  for possible thrush.  I did not see  any signs of white patches in other areas of your mouth or visualized portion of your throat.  Codeine  cough syrup if needed at bedtime as long as you are not wheezing or new shortness of breath.    I did check some of the lab work that we would follow-up on since we are doing blood work today.  I did check some of the labs that we had planned on checking in 6 months and can review those at your follow-up visit.   Recheck in 1 week, sooner if any worsening symptoms.      Signed,   Reyes Pines, MD [facility], [facility] 10/05/23 2:28 PM

## 2023-10-05 NOTE — Patient Instructions (Addendum)
 I am sorry you are not feeling significantly better at this point.  As we discussed, your symptoms could be from asthma or irritation of the upper airway with persistent cough but with the frequent need for albuterol  nebulizer I think returning to higher dose of prednisone  temporarily would be reasonable.  I sent in a new prescription to start at 60 mg 3 times per day, and then taper from there.  Right-sided chest wall soreness likely due to chest wall pain or muscular pain as you are able to somewhat reproduce that area but I will look at the chest x-ray as well.  Please have that performed at the [facility] location today if possible.  Once I see the results I will let you know.  If there is an infiltrate, I think addition of antibiotic may be necessary but not quite yet.  There is a slight white coating of your tongue, so I did write Diflucan  for possible thrush.  I did not see any signs of white patches in other areas of your mouth or visualized portion of your throat.  Codeine  cough syrup if needed at bedtime as long as you are not wheezing or new shortness of breath.    I did check some of the lab work that we would follow-up on since we are doing blood work today.  I did check some of the labs that we had planned on checking in 6 months and can review those at your follow-up visit.   Recheck in 1 week, sooner if any worsening symptoms.

## 2023-10-06 ENCOUNTER — Encounter: Payer: Self-pay | Admitting: Family Medicine

## 2023-10-06 LAB — CBC
HCT: 42.1 % (ref 36.0–46.0)
Hemoglobin: 13.4 g/dL (ref 12.0–15.0)
MCHC: 31.9 g/dL (ref 30.0–36.0)
MCV: 85.8 fL (ref 78.0–100.0)
Platelets: 326 10*3/uL (ref 150.0–400.0)
RBC: 4.9 Mil/uL (ref 3.87–5.11)
RDW: 13.9 % (ref 11.5–15.5)
WBC: 12.1 10*3/uL — ABNORMAL HIGH (ref 4.0–10.5)

## 2023-10-06 LAB — LIPID PANEL
Cholesterol: 257 mg/dL — ABNORMAL HIGH (ref 0–200)
HDL: 85.9 mg/dL (ref >39.00)
LDL Cholesterol: 150 mg/dL — ABNORMAL HIGH (ref 0–99)
NonHDL: 171.09
Total CHOL/HDL Ratio: 3
Triglycerides: 107 mg/dL (ref 0.0–149.0)
VLDL: 21.4 mg/dL (ref 0.0–40.0)

## 2023-10-06 LAB — COMPREHENSIVE METABOLIC PANEL
ALT: 18 U/L (ref 0–35)
AST: 13 U/L (ref 0–37)
Albumin: 4.5 g/dL (ref 3.5–5.2)
Alkaline Phosphatase: 62 U/L (ref 39–117)
BUN: 20 mg/dL (ref 6–23)
CO2: 26 meq/L (ref 19–32)
Calcium: 9.7 mg/dL (ref 8.4–10.5)
Chloride: 103 meq/L (ref 96–112)
Creatinine, Ser: 0.83 mg/dL (ref 0.40–1.20)
GFR: 73.49 mL/min (ref >60.00)
Glucose, Bld: 118 mg/dL — ABNORMAL HIGH (ref 70–99)
Potassium: 4.5 meq/L (ref 3.5–5.1)
Sodium: 138 meq/L (ref 135–145)
Total Bilirubin: 0.3 mg/dL (ref 0.2–1.2)
Total Protein: 7 g/dL (ref 6.0–8.3)

## 2023-10-06 LAB — HEMOGLOBIN A1C: Hgb A1c MFr Bld: 6.4 % (ref 4.6–6.5)

## 2023-10-07 ENCOUNTER — Ambulatory Visit: Payer: Medicare Other | Admitting: Family Medicine

## 2023-10-07 NOTE — Telephone Encounter (Signed)
 Pt asking if she should be taking Doxy or the Zpak as you had mentioned due to her WBC being elevated

## 2023-10-08 IMAGING — US US ABDOMEN COMPLETE W/ ELASTOGRAPHY
1 series · 12 of 25 positions shown · non-contrast
Comparison: CT 11/15/2020

CLINICAL DATA: Hepatic steatosis

EXAM:
ULTRASOUND ABDOMEN
ULTRASOUND HEPATIC ELASTOGRAPHY
TECHNIQUE: Sonography of the upper abdomen was performed. In addition,
ultrasound elastography evaluation of the liver was performed. A
region of interest was placed within the right lobe of the liver.
Following application of a compressive sonographic pulse, tissue
compressibility was assessed. Multiple assessments were performed at
the selected site. Median tissue compressibility was determined.
Previously, hepatic stiffness was assessed by shear wave velocity.
Based on recently published Society of Radiologists in Ultrasound
consensus article, reporting is now recommended to be performed in
the SI units of pressure (kiloPascals) representing hepatic
stiffness/elasticity. The obtained result is compared to the
published reference standards. (cACLD = compensated Advanced Chronic
Liver Disease)

[Series 1: us abdomen complete w/ elastography · 12 of 92 slices shown]
[im 4/92]
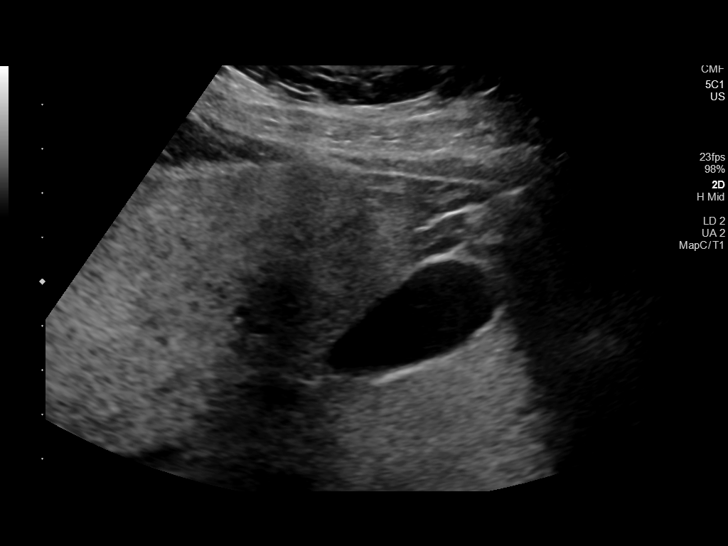
[im 12/92]
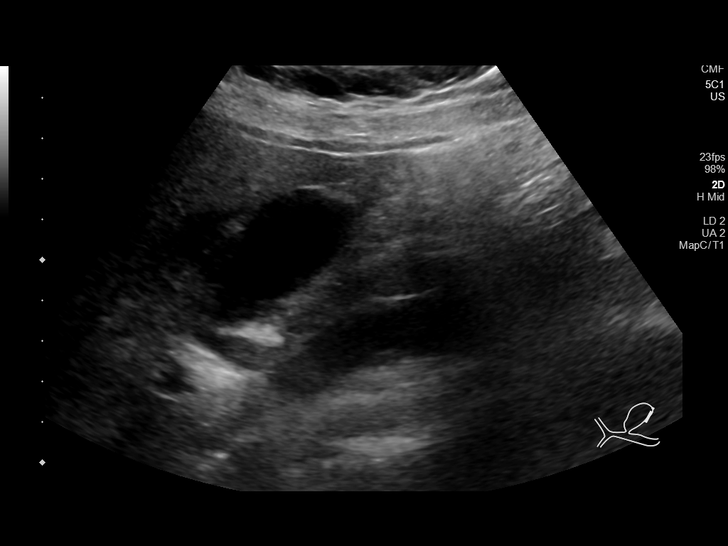
[im 19/92]
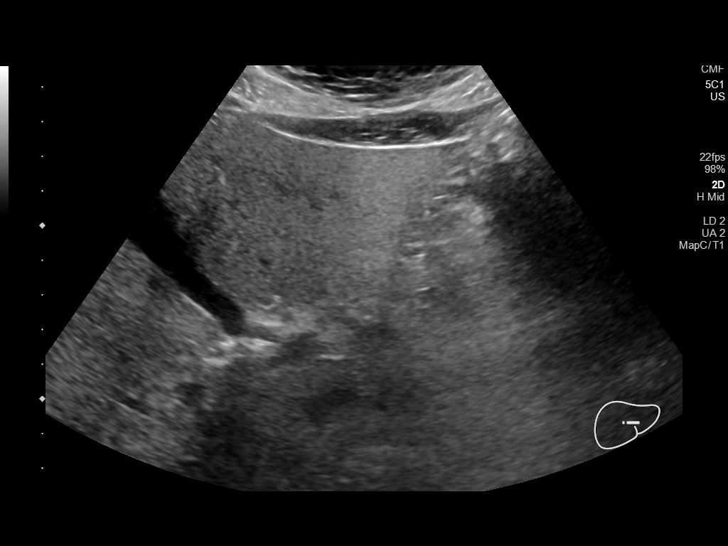
[im 27/92]
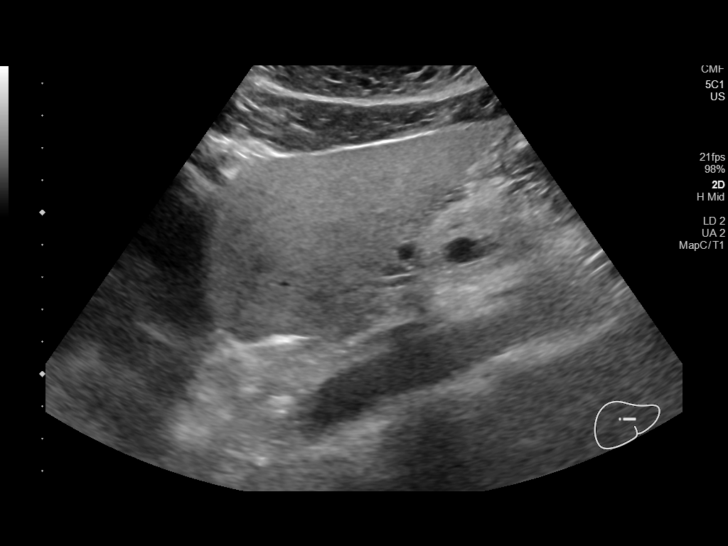
[im 35/92]
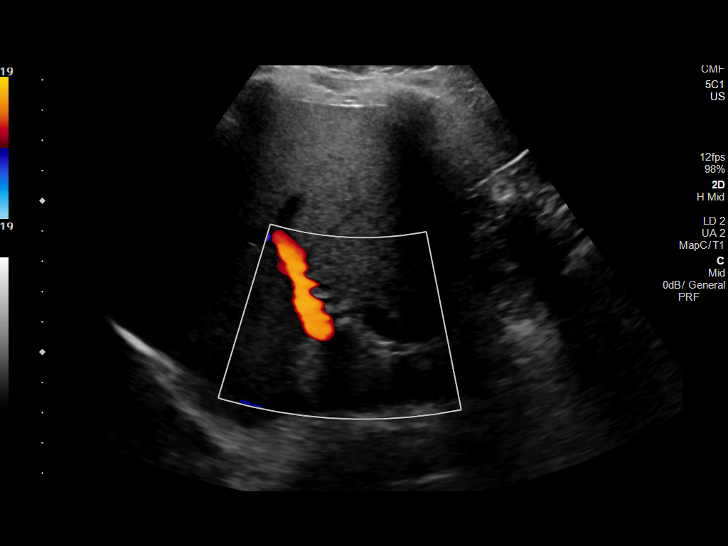
[im 42/92]
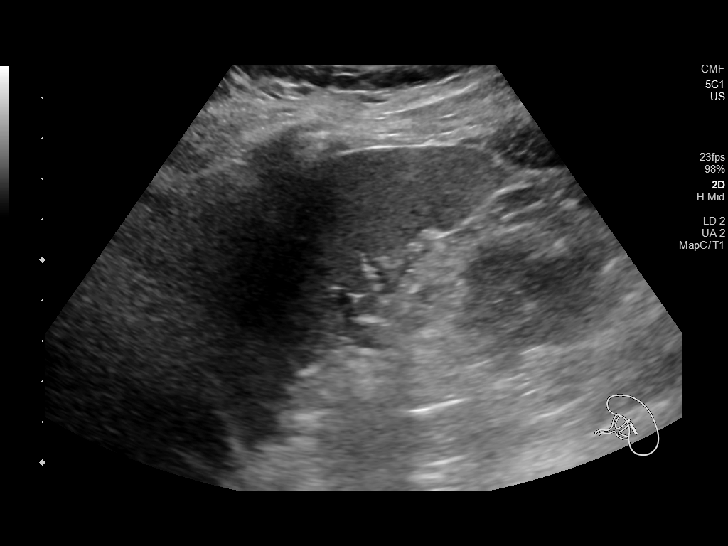
[im 50/92]
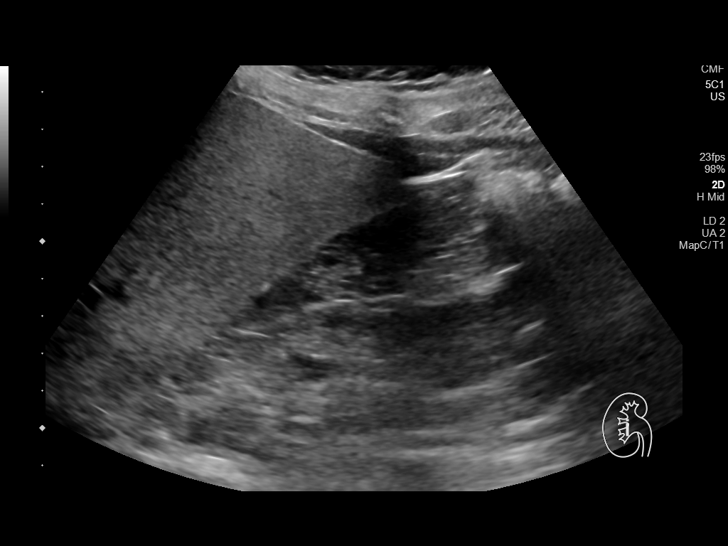
[im 57/92]
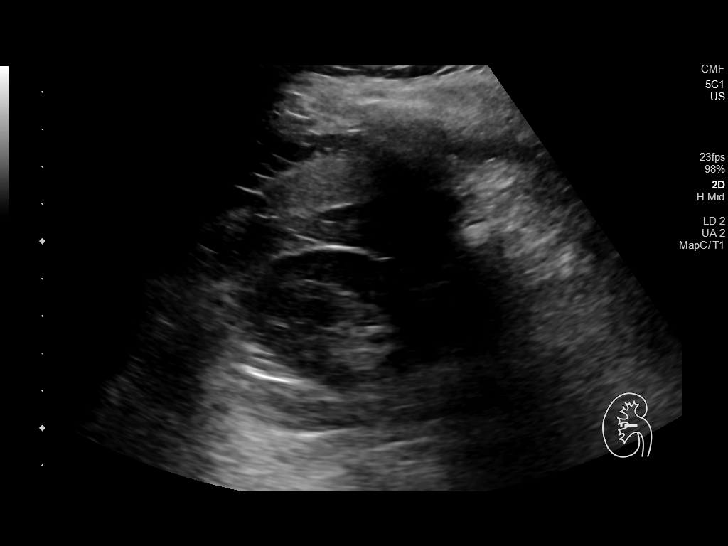
[im 65/92]
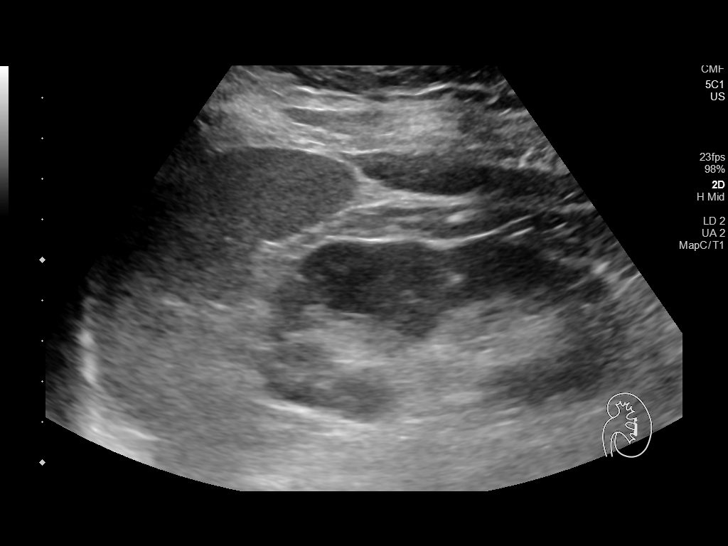
[im 73/92]
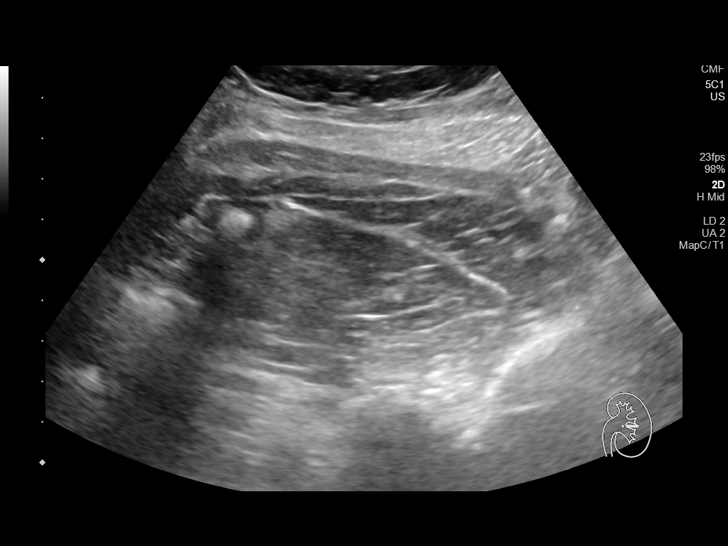
[im 80/92]
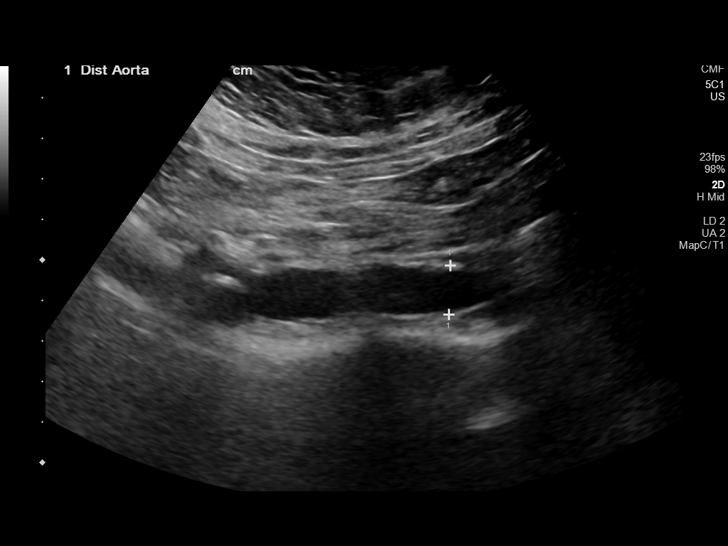
[im 88/92]
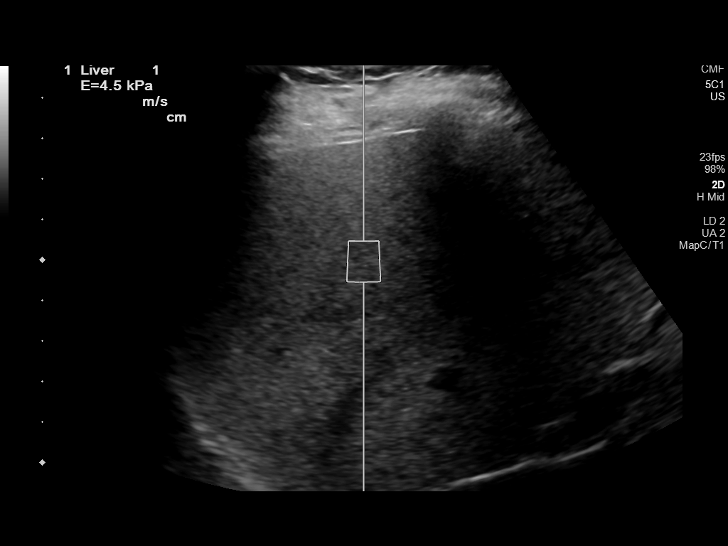

[12 of 25 positions shown; findings below may reference images not displayed]

FINDINGS: ULTRASOUND ABDOMEN

Gallbladder: No gallstones or wall thickening visualized. No
sonographic Murphy sign noted by sonographer.

Common bile duct: Diameter: Normal at 3 mm

Liver: No focal lesion identified. Mildly increased hepatic
echogenicity. Portal vein is patent on color Doppler imaging with
normal direction of blood flow towards the liver.

IVC: No abnormality visualized.

Pancreas: Visualized portion unremarkable.

Spleen: Size and appearance within normal limits.

Right Kidney: Length: 10.4 cm. Echogenicity within normal limits. No
mass or hydronephrosis visualized.

Left Kidney: Length: 9.1 cm. Echogenicity within normal limits. No
mass or hydronephrosis visualized.

Abdominal aorta: No aneurysm visualized.

Other findings: None.

ULTRASOUND HEPATIC ELASTOGRAPHY

Device: Siemens Helix VTQ

Patient position: Supine

Transducer C5

Number of measurements: 10

Hepatic segment:  8

Median kPa:

IQR:

IQR/Median kPa ratio:

Data quality:  Good

Diagnostic category:  < or = 5 kPa: high probability of being normal

The use of hepatic elastography is applicable to patients with viral
hepatitis and non-alcoholic fatty liver disease. At this time, there
is insufficient data for the referenced cut-off values and use in
other causes of liver disease, including alcoholic liver disease.
Patients, however, may be assessed by elastography and serve as
their own reference standard/baseline.

In patients with non-alcoholic liver disease, the values suggesting
compensated advanced chronic liver disease (cACLD) may be lower, and
patients may need additional testing with elasticity results of [DATE]
kPa.

Please note that abnormal hepatic elasticity and shear wave
velocities may also be identified in clinical settings other than
with hepatic fibrosis, such as: acute hepatitis, elevated right
heart and central venous pressures including use of beta blockers,
Moatshe disease (Rouji), infiltrative processes such as
mastocytosis/amyloidosis/infiltrative tumor/lymphoma, extrahepatic
cholestasis, with hyperemia in the post-prandial state, and with
liver transplantation. Correlation with patient history, laboratory
data, and clinical condition recommended.

Diagnostic Categories:

< or =5 kPa: high probability of being normal

< or =9 kPa: in the absence of other known clinical signs, rules [DATE] kPa and ?13 kPa: suggestive of cACLD, but needs further testing

>13 kPa: highly suggestive of cACLD

> or =17 kPa: highly suggestive of cACLD with an increased
probability of clinically significant portal hypertension
IMPRESSION: ULTRASOUND ABDOMEN:

Mildly increased hepatic echogenicity.

ULTRASOUND HEPATIC ELASTOGRAPHY:

Median kPa:

Diagnostic category:  < or = 5 kPa: high probability of being normal

## 2023-10-10 ENCOUNTER — Encounter: Payer: Self-pay | Admitting: Family Medicine

## 2023-10-15 ENCOUNTER — Ambulatory Visit: Payer: Medicare Other | Admitting: Family Medicine

## 2023-10-19 ENCOUNTER — Telehealth: Payer: Self-pay | Admitting: Family Medicine

## 2023-10-19 ENCOUNTER — Encounter: Payer: Self-pay | Admitting: Family Medicine

## 2023-10-19 NOTE — Telephone Encounter (Signed)
Left message for pt to schedule appt

## 2023-10-19 NOTE — Telephone Encounter (Signed)
-----   Message from [facility] sent at 10/19/2023  1:25 PM EST ----- Regarding: AWV This patient has been contacted or attempts to contact her have happened to schedule her AWV. Patient did read the my chart message about scheduling it with nothing set up as of yet. Per last office note she was to come back around 1/20 for med check and labs, she cancelled and stated she would call back.  I am having the front desk call her to see if she is ready to reschedule that cancelled appt and see if we can get her scheduled for her AWV also.   Ladies,   Please give this patient a call to see if she would like to r/s her appt with Neva Seat and see if we can get her set up for her AWV initial with Raynelle Fanning. Let me know if you have questions!

## 2023-11-12 ENCOUNTER — Encounter: Payer: Self-pay | Admitting: Family Medicine

## 2024-02-25 ENCOUNTER — Other Ambulatory Visit (HOSPITAL_COMMUNITY): Payer: Self-pay

## 2024-04-27 ENCOUNTER — Encounter: Payer: Self-pay | Admitting: Internal Medicine

## 2024-04-27 MED ORDER — AMOXICILLIN-POT CLAVULANATE 875-125 MG PO TABS: 1.0000 | ORAL_TABLET | Freq: Two times a day (BID) | ORAL | 0 refills | Status: DC

## 2024-04-27 NOTE — Telephone Encounter (Signed)
 Spoke to patient yesterday she started patient said yesterday started with some left lower quadrant pain that intensified overnight consistent with prior episodes of diverticulitis.  No fever, she is passing flatus And has had a bowel movement.The Augmentin  she has on is expired and is asking for a new prescription. Sent. Acetaminophen  did not help - I have suggested ibuprofen  400-600 mg to reduce pain and allow rest/sleep.  She is aware to go to UC or ED if worsening and I have asked her to message me in several days with an update.    Meds ordered this encounter  Medications   amoxicillin -clavulanate (AUGMENTIN ) 875-125 MG tablet    Sig: Take 1 tablet by mouth 2 (two) times daily.    Dispense:  20 tablet    Refill:  0

## 2024-04-29 ENCOUNTER — Encounter: Payer: Self-pay | Admitting: Internal Medicine

## 2024-05-30 ENCOUNTER — Other Ambulatory Visit: Payer: Self-pay | Admitting: Family Medicine

## 2024-05-30 DIAGNOSIS — F5104 Psychophysiologic insomnia: Secondary | ICD-10-CM

## 2024-05-30 DIAGNOSIS — F4329 Adjustment disorder with other symptoms: Secondary | ICD-10-CM

## 2024-05-30 NOTE — Telephone Encounter (Signed)
LMTCB to make an appointment.

## 2024-05-30 NOTE — Telephone Encounter (Signed)
 Sent MyChart message, will try calling as well

## 2024-05-31 ENCOUNTER — Encounter: Payer: Self-pay | Admitting: Family Medicine

## 2024-05-31 NOTE — Telephone Encounter (Signed)
 LMTCB to schedule appt - Letter out as well

## 2024-05-31 NOTE — Telephone Encounter (Signed)
 Yes - 30 day refill granted.

## 2024-05-31 NOTE — Telephone Encounter (Signed)
 Called patient and left vm. Tried to schedule office visit with Dr. Levora

## 2024-06-24 ENCOUNTER — Other Ambulatory Visit: Payer: Self-pay | Admitting: Family Medicine

## 2024-06-24 DIAGNOSIS — F5104 Psychophysiologic insomnia: Secondary | ICD-10-CM

## 2024-06-24 DIAGNOSIS — F4329 Adjustment disorder with other symptoms: Secondary | ICD-10-CM

## 2024-08-22 ENCOUNTER — Encounter: Payer: Self-pay | Admitting: Family Medicine

## 2024-08-22 ENCOUNTER — Ambulatory Visit: Admitting: Family Medicine

## 2024-08-22 VITALS — BP 112/66 | HR 85 | Temp 98.3°F | Resp 18 | Ht 62.0 in | Wt 132.6 lb

## 2024-08-22 DIAGNOSIS — F5104 Psychophysiologic insomnia: Secondary | ICD-10-CM | POA: Diagnosis not present

## 2024-08-22 DIAGNOSIS — C4490 Unspecified malignant neoplasm of skin, unspecified: Secondary | ICD-10-CM | POA: Insufficient documentation

## 2024-08-22 DIAGNOSIS — J4530 Mild persistent asthma, uncomplicated: Secondary | ICD-10-CM | POA: Insufficient documentation

## 2024-08-22 DIAGNOSIS — F4329 Adjustment disorder with other symptoms: Secondary | ICD-10-CM | POA: Diagnosis not present

## 2024-08-22 DIAGNOSIS — R7303 Prediabetes: Secondary | ICD-10-CM

## 2024-08-22 DIAGNOSIS — Z91030 Bee allergy status: Secondary | ICD-10-CM | POA: Insufficient documentation

## 2024-08-22 DIAGNOSIS — J4540 Moderate persistent asthma, uncomplicated: Secondary | ICD-10-CM

## 2024-08-22 DIAGNOSIS — E785 Hyperlipidemia, unspecified: Secondary | ICD-10-CM

## 2024-08-22 MED ORDER — ALPRAZOLAM 0.25 MG PO TABS: 0.2500 mg | ORAL_TABLET | Freq: Every day | ORAL | 0 refills | Status: AC | PRN

## 2024-08-22 NOTE — Patient Instructions (Signed)
 Thank you for coming in today.  I will check labs and we can discuss if any changes in plan needed based on those results.  Coronary calcium scoring is an option to help decide on cholesterol medication depending on your labs.  Let me know if you would like me to order that study.  Keep follow-up with your other specialists including for repeat bone density testing, mammogram and let me know if additional medication refills needed.  Alprazolam  was refilled today.  Take care!

## 2024-08-22 NOTE — Progress Notes (Unsigned)
 Subjective:  Patient ID: Amy Phillips, female    DOB: [birth year]  Age: 68 y.o. MRN: [id number]  CC:  Chief Complaint  Patient presents with   Annual Exam    No questions or concerns.     HPI Amy Phillips presents for  Chronic condition follow-up.  GERD, metabolic dysfunction associated steatotic liver disease.  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  Unchanged on 09/02/2023 MRI. GI Dr. Avram, Nexium  40 mg.  Stable. Colonoscopy 07/2023 - 5 year repeat.  Followed by liver specialist prior - Dawn Drazek, 1 more visit. Scan reassuring.  Lab Results  Component Value Date   ALT 18 10/05/2023   AST 13 10/05/2023   ALKPHOS 62 10/05/2023   BILITOT 0.3 10/05/2023   On calcium and vitamin D, GYN Dr. Henry.  Bone density November 2023.  Appt with GYN in next month. Will discuss repeat bone density with GYN. Mammogram at GYN.  Osteopenia.  Previous testing ordered by Dr. Henry, GYN.  Situational stress/insomnia Has been on Wellbutrin , trial off meds, and then returned to treatment.  Alprazolam  as needed for sleep intermittently.  Controlled substance database reviewed, with #30 last filled on 04/23/2023.off wellbutrin  for now, but in wintertime sometimes has to restart.  Needs refill of xanax  - just ran out. Denies side effects when taking and effective.   Asthma Followed by allergist, recent visit with allergy testing, discussing allergy shots - not sure. moderate persistent asthma. No current daily/maintenance meds. Has taken Qvar  in the past, albuterol  if needed - infrequent.  Flonase , Zyrtec.  Has required higher dosing of prednisone  at 60 mg for a few days during flares in the past.  Currently doing well.  Prediabetes: Borderline diabetic level in January.  Diet/exercise approach. Weight up 2#.  Exercise - tennis on weekends. Stretching. Some walking.  No soda/sweet tea/fast food.  Small piece of fudge this morning, otherwise fasting.   Lab Results  Component Value Date    HGBA1C 6.4 10/05/2023   Wt Readings from Last 3 Encounters:  08/22/24 132 lb 9.6 oz (60.1 kg)  10/05/23 130 lb 12.8 oz (59.3 kg)  08/07/23 127 lb (57.6 kg)   Hyperlipidemia: She has chosen diet/exercise approach previously.  Family history of hyperlipidemia, father with history of CAD in his 6's. Option of CCS discussed.  The 10-year ASCVD risk score (Arnett DK, et al., 2019) is: 5.5%   Values used to calculate the score:     Age: 73 years     Clincally relevant sex: Female     Is Non-Hispanic African American: No     Diabetic: No     Tobacco smoker: No     Systolic Blood Pressure: 112 mmHg     Is BP treated: No     HDL Cholesterol: 68 mg/dL     Total Cholesterol: 249 mg/dL    Lab Results  Component Value Date   CHOL 257 (H) 10/05/2023   HDL 85.90 10/05/2023   LDLCALC 150 (H) 10/05/2023   TRIG 107.0 10/05/2023   CHOLHDL 3 10/05/2023   Lab Results  Component Value Date   ALT 18 10/05/2023   AST 13 10/05/2023   ALKPHOS 62 10/05/2023   BILITOT 0.3 10/05/2023   Declines vaccines.   History Patient Active Problem List   Diagnosis Date Noted   Allergic to bees 08/22/2024   Malignant neoplasm of skin 08/22/2024   Mild persistent asthma without complication 08/22/2024   Hx of adenomatous colonic polyps 08/14/2023   Prediabetes  03/03/2023   Splenic artery aneurysm 03/03/2023   Metabolic dysfunction-associated steatotic liver disease (MASLD) 08/27/2022   Mild intermittent asthma 08/27/2022   Gastroesophageal reflux disease 07/14/2022   Adverse reaction to food 07/04/2021   Allergic rhinitis 07/04/2021   Allergic rhinitis due to pollen 07/04/2021   Cough 07/04/2021   Fibroid, uterine 06/12/2015   Leiomyoma of uterus, unspecified 12/29/2014   Abnormal uterine bleeding 12/29/2014   Enlarged uterus 07/28/2014   Fibroids 05/24/2013   Post-menopausal bleeding 05/24/2013   Diverticulosis 07/27/2012   Colon polyps 07/27/2012   HYPERLIPIDEMIA-MIXED 10/04/2010    Palpitations 10/04/2010   DYSPNEA 10/04/2010   DYSPNEA ON EXERTION 10/04/2010   Past Medical History:  Diagnosis Date   Allergy    Anxiety    Arrhythmia    PVCs- no meds   Asthma    Basal cell carcinoma of nose 08/19/2017   COVID    Diverticulitis 08/05/2012   Dr Kristie   GERD (gastroesophageal reflux disease)    Hx of adenomatous colonic polyps 08/14/2023   11/24 4 adenomas max 8 mm recall 2029    Hyperlipidemia    Lesion of right native kidney    Ovarian cyst    recurrent   PVC's (premature ventricular contractions)    Uterine fibroid 2006   16 week size   Past Surgical History:  Procedure Laterality Date   ABDOMINAL HYSTERECTOMY N/A 06/12/2015   Procedure: HYSTERECTOMY ABDOMINAL With Vertical Incision;  Surgeon: Shanda SHAUNNA Muscat, MD;   BILATERAL SALPINGECTOMY  06/12/2015   Procedure: BILATERAL SALPINGECTOMY;  Surgeon: Shanda SHAUNNA Muscat, MD;  Location: [facility];  Service: Gynecology;;   COLONOSCOPY     DILATATION & CURETTAGE/HYSTEROSCOPY WITH TRUECLEAR N/A 09/08/2013   Procedure: DILATATION & CURETTAGE/HYSTEROSCOPY WITH TRUCLEAR;  Surgeon: Randine VEAR Fender, MD;  Location: [facility];  Service: Gynecology;  Laterality: N/A;   DILATION AND CURETTAGE OF UTERUS     D&E x2 missed ab   ESOPHAGOGASTRODUODENOSCOPY     IR RADIOLOGIST EVAL & MGMT  12/19/2020   UPPER GI ENDOSCOPY  07/23/12   grade 1 esophagitis noted:  otherwise normal esophagogastrduodenoscopy   Allergies  Allergen Reactions   Peanuts [Peanut Oil] Other (See Comments)    Mood changes per pt and husband    Shellfish Allergy Other (See Comments)    Throat irritation   Strawberry (Diagnostic)    Prior to Admission medications   Medication Sig Start Date End Date Taking? Authorizing Provider  albuterol  (VENTOLIN  HFA) 108 (90 Base) MCG/ACT inhaler Inhale 2 puffs into the lungs every 6 (six) hours as needed for wheezing or shortness of breath. 05/27/19  Yes Kip Ade, NP  ALPRAZolam  (XANAX ) 0.25 MG tablet Take 1  tablet (0.25 mg total) by mouth daily as needed for anxiety or sleep. 04/06/23  Yes Levora Reyes SAUNDERS, MD  Azelaic Acid 15 % gel 1 application to affected area   Yes [provider]  beclomethasone (QVAR  REDIHALER) 80 MCG/ACT inhaler Inhale 2 puffs into the lungs 2 (two) times daily. 07/04/21  Yes Levora Reyes SAUNDERS, MD  buPROPion  (WELLBUTRIN  XL) 150 MG 24 hr tablet TAKE 1 TABLET BY MOUTH EVERY DAY 06/24/24  Yes Levora Reyes SAUNDERS, MD  cetirizine (ZYRTEC) 10 MG tablet Take 10 mg by mouth daily.   Yes [provider]  doxycycline  (VIBRA -TABS) 100 MG tablet Take 1 tablet (100 mg total) by mouth 2 (two) times daily. 10/02/23  Yes Richad Jon HERO, NP  EPINEPHrine  0.3 mg/0.3 mL IJ SOAJ injection USE AS DIRECTED AS  NEEDED FOR SYSTEMIC REACTIONS   Yes [provider]  esomeprazole  (NEXIUM ) 20 MG capsule Take 20 mg by mouth daily at 12 noon.   Yes [provider]  fluorouracil (EFUDEX) 5 % cream Apply topically 2 (two) times daily.   Yes [provider]  fluticasone  (FLONASE ) 50 MCG/ACT nasal spray Place 1-2 sprays into both nostrils daily. 02/11/22  Yes   Magnesium  500 MG TABS Take 500 mg by mouth daily.   Yes [provider]  ondansetron  (ZOFRAN -ODT) 4 MG disintegrating tablet Take 1 tablet (4 mg total) by mouth every 8 (eight) hours as needed for nausea or vomiting. 06/10/23  Yes Avram Lupita BRAVO, MD  tretinoin (RETIN-A) 0.025 % cream 1 application to affected area in the evening to face 08/16/19  Yes [provider]  valACYclovir  (VALTREX ) 1000 MG tablet Take 1 tablet (1,000 mg total) by mouth daily. For 5 days at first sign of outbreak 03/11/16  Yes Loreli Elyn SAILOR, MD  amoxicillin -clavulanate (AUGMENTIN ) 875-125 MG tablet Take 1 tablet by mouth 2 (two) times daily. Patient not taking: Reported on 08/22/2024 04/27/24   Avram Lupita BRAVO, MD  guaiFENesin -codeine  100-10 MG/5ML syrup Take 5-10 mLs by mouth 3 (three) times daily as needed for cough. Patient not  taking: Reported on 08/22/2024 10/05/23   Levora Reyes SAUNDERS, MD  metroNIDAZOLE (METROCREAM) 0.75 % cream Apply 1 application. topically 2 (two) times daily. Patient not taking: Reported on 08/22/2024 11/04/21   [provider]  predniSONE  (DELTASONE ) 20 MG tablet 3 by mouth for 3 days, then 2 by mouth for 2 days, then 1 by mouth for 2 days, then 1/2 by mouth for 2 days. Patient not taking: Reported on 08/22/2024 10/05/23   Levora Reyes SAUNDERS, MD  fluticasone  (FLOVENT  HFA) 110 MCG/ACT inhaler INHALE 2 PUFFS 2 TIMES DAILY 10/11/20 02/12/21  Frutoso Luz, MD   Social History   Socioeconomic History   Marital status: Married    Spouse name: Not on file   Number of children: 2   Years of education: Not on file   Highest education level: Not on file  Occupational History   Not on file  Tobacco Use   Smoking status: Never   Smokeless tobacco: Never  Vaping Use   Vaping status: Never Used  Substance and Sexual Activity   Alcohol use: Not Currently    Comment: rare   Drug use: No   Sexual activity: Yes    Partners: Male    Birth control/protection: Surgical, Post-menopausal    Comment: hysterectomy  Other Topics Concern   Not on file  Social History Narrative   Married, 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 : Never true  Transportation Needs: No Transportation Needs (03/15/2024)   Received from [facility]    In the past 12 months, has lack of  reliable transportation kept you from medical appointments, meetings, work or from getting things needed for daily living? : No  Physical Activity: Not on file  Stress: Not on file  Social Connections: Not on file  Intimate Partner Violence: Not on file    Review of Systems 13 point review of systems per patient health survey noted.  Negative other than as indicated above or in HPI.    Objective:   Vitals:   08/22/24 1331  BP: 112/66  Pulse: 85  Resp: 18  Temp: 98.3 F (36.8 C)  TempSrc: Temporal  SpO2: 98%  Weight: 132 lb 9.6 oz (60.1 kg)  Height: 5' 2 (1.575 m)   Physical Exam Vitals reviewed.  Constitutional:      Appearance: Normal appearance. She is well-developed.  HENT:     Head: Normocephalic and atraumatic.  Eyes:     Conjunctiva/sclera: Conjunctivae normal.     Pupils: Pupils are equal, round, and reactive to light.  Neck:     Vascular: No carotid bruit.  Cardiovascular:     Rate and Rhythm: Normal rate and regular rhythm.     Heart sounds: Normal heart sounds.  Pulmonary:     Effort: Pulmonary effort is normal.     Breath sounds: Normal breath sounds.  Abdominal:     Palpations: Abdomen is soft. There is no pulsatile mass.     Tenderness: There is no abdominal tenderness.  Musculoskeletal:     Right lower leg: No edema.     Left lower leg: No edema.  Skin:    General: Skin is warm and dry.  Neurological:     Mental Status: She is alert and oriented to person, place, and time.  Psychiatric:        Mood and Affect: Mood normal.        Behavior: Behavior normal.      Assessment & Plan:  TAYDEN DURAN is a 68 y.o. female . Prediabetes - Plan: Hemoglobin A1c  - Check labs.  Continue diet/exercise approach.  Adjust plan accordingly based on results.  Psychophysiological insomnia - Plan: ALPRAZolam  (XANAX ) 0.25 MG tablet Adjustment disorder with other symptom - Plan: ALPRAZolam  (XANAX ) 0.25 MG tablet  - Stable with infrequent use, side effects,  risks and discussed previously.  No concerns on controlled substance database.  Refill ordered.  Hyperlipidemia, unspecified hyperlipidemia type - Plan: Comprehensive metabolic panel with GFR, Lipid panel  - Check labs.  Consider coronary calcium scoring to help assist with decision on statin but will check labs first.  Moderate persistent asthma without complication  - Stable currently.  RTC precautions if flare.  Has required 60 mg dosing of prednisone  previously during flares, hold on new meds for now.  Advised to follow-up with specialist regarding repeat bone density, mammogram.   Meds ordered this encounter  Medications   ALPRAZolam  (XANAX ) 0.25 MG tablet    Sig: Take 1 tablet (0.25 mg total) by mouth daily as needed for anxiety or sleep.    Dispense:  30 tablet    Refill:  0   Patient Instructions  Thank you for coming in today.  I will check labs and we can discuss if any changes in plan needed based on those results.  Coronary calcium scoring is an option to help decide on cholesterol medication depending on your labs.  Let me know if you would like me to order that study.  Keep follow-up with your other specialists including for repeat bone density testing, mammogram and  let me know if additional medication refills needed.  Alprazolam  was refilled today.  Take care!    Signed,   Reyes Pines, MD [facility], [facility] 08/22/24 1:53 PM

## 2024-08-23 ENCOUNTER — Ambulatory Visit: Payer: Self-pay | Admitting: Family Medicine

## 2024-08-23 LAB — COMPREHENSIVE METABOLIC PANEL WITH GFR
ALT: 21 U/L (ref 0–35)
AST: 16 U/L (ref 0–37)
Albumin: 4.7 g/dL (ref 3.5–5.2)
Alkaline Phosphatase: 59 U/L (ref 39–117)
BUN: 16 mg/dL (ref 6–23)
CO2: 28 meq/L (ref 19–32)
Calcium: 9.8 mg/dL (ref 8.4–10.5)
Chloride: 104 meq/L (ref 96–112)
Creatinine, Ser: 0.79 mg/dL (ref 0.40–1.20)
GFR: 77.5 mL/min (ref >60.00)
Glucose, Bld: 89 mg/dL (ref 70–99)
Potassium: 4.2 meq/L (ref 3.5–5.1)
Sodium: 140 meq/L (ref 135–145)
Total Bilirubin: 0.4 mg/dL (ref 0.2–1.2)
Total Protein: 6.9 g/dL (ref 6.0–8.3)

## 2024-08-23 LAB — HEMOGLOBIN A1C: Hgb A1c MFr Bld: 5.8 % (ref 4.6–6.5)

## 2024-08-23 LAB — LIPID PANEL
Cholesterol: 281 mg/dL — ABNORMAL HIGH (ref 0–200)
HDL: 69.6 mg/dL (ref >39.00)
LDL Cholesterol: 182 mg/dL — ABNORMAL HIGH (ref 0–99)
NonHDL: 211.72
Total CHOL/HDL Ratio: 4
Triglycerides: 150 mg/dL — ABNORMAL HIGH (ref 0.0–149.0)
VLDL: 30 mg/dL (ref 0.0–40.0)

## 2024-09-01 ENCOUNTER — Ambulatory Visit

## 2024-10-10 ENCOUNTER — Ambulatory Visit: Admitting: Family Medicine

## 2025-02-20 ENCOUNTER — Ambulatory Visit: Admitting: Family Medicine
